# Patient Record
Sex: Female | Born: 1975 | Race: Black or African American | Hispanic: No | Marital: Single | Smoking: Current some day smoker
Health system: Southern US, Community
[De-identification: ages and names within clinical notes are randomized; demographics above are authoritative.]

## PROBLEM LIST (undated history)

## (undated) DIAGNOSIS — F32A Depression, unspecified: Secondary | ICD-10-CM

## (undated) DIAGNOSIS — J45909 Unspecified asthma, uncomplicated: Secondary | ICD-10-CM

## (undated) DIAGNOSIS — F209 Schizophrenia, unspecified: Secondary | ICD-10-CM

## (undated) DIAGNOSIS — E669 Obesity, unspecified: Secondary | ICD-10-CM

## (undated) DIAGNOSIS — J40 Bronchitis, not specified as acute or chronic: Secondary | ICD-10-CM

## (undated) DIAGNOSIS — F329 Major depressive disorder, single episode, unspecified: Secondary | ICD-10-CM

---
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
## 2017-01-20 ENCOUNTER — Encounter (HOSPITAL_COMMUNITY): Payer: Self-pay | Admitting: *Deleted

## 2017-01-20 ENCOUNTER — Emergency Department (HOSPITAL_COMMUNITY)
Admission: EM | Admit: 2017-01-20 | Discharge: 2017-01-20 | Disposition: A | Discharge status: Home / Self Care | Payer: Medicaid - Out of State | Attending: Emergency Medicine | Admitting: Emergency Medicine

## 2017-01-20 ENCOUNTER — Emergency Department (HOSPITAL_COMMUNITY): Payer: Medicaid - Out of State

## 2017-01-20 DIAGNOSIS — F17200 Nicotine dependence, unspecified, uncomplicated: Secondary | ICD-10-CM | POA: Insufficient documentation

## 2017-01-20 DIAGNOSIS — M5441 Lumbago with sciatica, right side: Secondary | ICD-10-CM | POA: Insufficient documentation

## 2017-01-20 DIAGNOSIS — M545 Low back pain: Secondary | ICD-10-CM | POA: Diagnosis present

## 2017-01-20 DIAGNOSIS — M4854XA Collapsed vertebra, not elsewhere classified, thoracic region, initial encounter for fracture: Secondary | ICD-10-CM | POA: Diagnosis not present

## 2017-01-20 DIAGNOSIS — S22000A Wedge compression fracture of unspecified thoracic vertebra, initial encounter for closed fracture: Secondary | ICD-10-CM

## 2017-01-20 HISTORY — DX: Obesity, unspecified: E66.9

## 2017-01-20 LAB — COMPREHENSIVE METABOLIC PANEL
ALT: 13 U/L — AB (ref 14–54)
AST: 15 U/L (ref 15–41)
Albumin: 3.3 g/dL — ABNORMAL LOW (ref 3.5–5.0)
Alkaline Phosphatase: 44 U/L (ref 38–126)
Anion gap: 6 (ref 5–15)
BILIRUBIN TOTAL: 0.4 mg/dL (ref 0.3–1.2)
BUN: 13 mg/dL (ref 6–20)
CHLORIDE: 107 mmol/L (ref 101–111)
CO2: 24 mmol/L (ref 22–32)
CREATININE: 0.98 mg/dL (ref 0.44–1.00)
Calcium: 8.3 mg/dL — ABNORMAL LOW (ref 8.9–10.3)
GFR calc Af Amer: >60 mL/min (ref >60)
GLUCOSE: 87 mg/dL (ref 65–99)
Potassium: 4.2 mmol/L (ref 3.5–5.1)
Sodium: 137 mmol/L (ref 135–145)
TOTAL PROTEIN: 6.5 g/dL (ref 6.5–8.1)

## 2017-01-20 LAB — URINALYSIS, ROUTINE W REFLEX MICROSCOPIC
Bacteria, UA: NONE SEEN
Bilirubin Urine: NEGATIVE
Glucose, UA: NEGATIVE mg/dL
Hgb urine dipstick: NEGATIVE
Ketones, ur: NEGATIVE mg/dL
Nitrite: NEGATIVE
PROTEIN: NEGATIVE mg/dL
Specific Gravity, Urine: 1.015 (ref 1.005–1.030)
pH: 7 (ref 5.0–8.0)

## 2017-01-20 LAB — CBC
HCT: 36.8 % (ref 36.0–46.0)
Hemoglobin: 11.7 g/dL — ABNORMAL LOW (ref 12.0–15.0)
MCH: 27.2 pg (ref 26.0–34.0)
MCHC: 31.8 g/dL (ref 30.0–36.0)
MCV: 85.6 fL (ref 78.0–100.0)
PLATELETS: 442 10*3/uL — AB (ref 150–400)
RBC: 4.3 MIL/uL (ref 3.87–5.11)
RDW: 15.5 % (ref 11.5–15.5)
WBC: 13.9 10*3/uL — AB (ref 4.0–10.5)

## 2017-01-20 LAB — LIPASE, BLOOD: Lipase: 23 U/L (ref 11–51)

## 2017-01-20 LAB — I-STAT BETA HCG BLOOD, ED (MC, WL, AP ONLY): I-stat hCG, quantitative: <5 m[IU]/mL (ref <5)

## 2017-01-20 MED ORDER — OXYCODONE-ACETAMINOPHEN 5-325 MG PO TABS: 1.0000 | ORAL_TABLET | ORAL | 0 refills | Status: DC | PRN

## 2017-01-20 MED ORDER — PREDNISONE 10 MG PO TABS: 10.0000 mg | ORAL_TABLET | Freq: Every day | ORAL | 0 refills | Status: DC

## 2017-01-20 MED ORDER — NAPROXEN 375 MG PO TABS: 375.0000 mg | ORAL_TABLET | Freq: Two times a day (BID) | ORAL | 0 refills | Status: DC

## 2017-01-20 MED ORDER — HYDROMORPHONE HCL 1 MG/ML IJ SOLN
1.0000 mg | Freq: Once | INTRAMUSCULAR | Status: AC
  Administered 2017-01-20: 1 mg via INTRAMUSCULAR
  Filled 2017-01-20: qty 1

## 2017-01-20 NOTE — ED Triage Notes (Signed)
Pt reports having ongoing RLQ pain for over a week, its now radiating to right side lower back. Increases with pain and reports urinary frequency.

## 2017-01-20 NOTE — Discharge Instructions (Signed)
You have an old compression fracture of thoracic lumbar vertebrae #11.  Additionally, I think there is nerve pain that originates in your lower back and radiates to the right leg.  Prescription for pain medicine, anti-inflammatory medicine, prednisone. Ice pack. Follow-up with orthopedics. Phone number given.

## 2017-01-21 NOTE — ED Provider Notes (Signed)
[facility] DEPT Provider Note   CSN: [id number] Arrival date & time: 01/20/17  1037     History   Chief Complaint Chief Complaint  Patient presents with  . Abdominal Pain  . Back Pain    HPI Anna Moses is a 41 y.o. female.  Right lower back pain with radiation to the right leg for 1-2 weeks intermittently. No bowel or bladder incontinence. No dysuria, frequency, hematuria. She has had one emergency room visit to [facility] with minimal relief of pain. Severity is moderate. Palpation and positioning make pain worse      Past Medical History:  Diagnosis Date  . Obesity     There are no active problems to display for this patient.   History reviewed. No pertinent surgical history.  OB History    No data available       Home Medications    Prior to Admission medications   Medication Sig Start Date End Date Taking? Authorizing Provider  naproxen (NAPROSYN) 375 MG tablet Take 1 tablet (375 mg total) by mouth 2 (two) times daily. 01/20/17   Nat Christen, MD  oxyCODONE-acetaminophen (PERCOCET) 5-325 MG tablet Take 1-2 tablets by mouth every 4 (four) hours as needed. 01/20/17   Nat Christen, MD  predniSONE (DELTASONE) 10 MG tablet Take 1 tablet (10 mg total) by mouth daily with breakfast. 3 tablets for 3 days, 2 tablets for 3 days, one tablet for 3 days 01/20/17   Nat Christen, MD    Family History History reviewed. No pertinent family history.  Social History Social History  Substance Use Topics  . Smoking status: Current Every Day Smoker  . Smokeless tobacco: Not on file  . Alcohol use No     Allergies   Patient has no known allergies.   Review of Systems Review of Systems  All other systems reviewed and are negative.    Physical Exam Updated Vital Signs BP 130/78 (BP Location: Right Arm)   Pulse 76   Temp 98.2 F (36.8 C) (Oral)   Resp 18   LMP 01/09/2017   SpO2 99%   Physical Exam  Constitutional: She is oriented to person,  place, and time.  obese  HENT:  Head: Normocephalic and atraumatic.  Eyes: Conjunctivae are normal.  Neck: Neck supple.  Cardiovascular: Normal rate and regular rhythm.   Pulmonary/Chest: Effort normal and breath sounds normal.  Abdominal: Soft. Bowel sounds are normal.  Musculoskeletal:  Tender right lower back.  Pain with straight leg raise right greater than left  Neurological: She is alert and oriented to person, place, and time.  Skin: Skin is warm and dry.  Psychiatric: She has a normal mood and affect. Her behavior is normal.  Nursing note and vitals reviewed.    ED Treatments / Results  Labs (all labs ordered are listed, but only abnormal results are displayed) Labs Reviewed  COMPREHENSIVE METABOLIC PANEL - Abnormal; Notable for the following:       Result Value   Calcium 8.3 (*)    Albumin 3.3 (*)    ALT 13 (*)    All other components within normal limits  CBC - Abnormal; Notable for the following:    WBC 13.9 (*)    Hemoglobin 11.7 (*)    Platelets 442 (*)    All other components within normal limits  URINALYSIS, ROUTINE W REFLEX MICROSCOPIC - Abnormal; Notable for the following:    APPearance HAZY (*)    Leukocytes, UA LARGE (*)  Squamous Epithelial / LPF 0-5 (*)    All other components within normal limits  LIPASE, BLOOD  POC URINE PREG, ED    EKG  EKG Interpretation None       Radiology Dg Lumbar Spine Complete  Result Date: 01/20/2017 CLINICAL DATA:  Low back pain radiating down the right leg EXAM: LUMBAR SPINE - COMPLETE 4+ VIEW COMPARISON:  None. FINDINGS: T11 vertebral body compression fracture of indeterminate age. Remainder the vertebral body heights are maintained. No static listhesis. Mild levocurvature of the lumbar spine. Degenerative disc disease with disc height loss at T10-11 and T11-12. IMPRESSION: Age-indeterminate T11 vertebral body compression fracture. Electronically Signed   By: Kathreen Devoid   On: 01/20/2017 12:41     Procedures Procedures (including critical care time)  Medications Ordered in ED Medications  HYDROmorphone (DILAUDID) injection 1 mg (1 mg Intramuscular Given 01/20/17 1248)     Initial Impression / Assessment and Plan / ED Course  I have reviewed the triage vital signs and the nursing notes.  Pertinent labs & imaging results that were available during my care of the patient were reviewed by me and considered in my medical decision making (see chart for details).     Plain films of lumbar spine reveal a compression fracture of T11, age indeterminate. History and physical most consistent with sciatic-like pain.  Discharge medications Naprosyn 375 mg, Percocet, prednisone.  Final Clinical Impressions(s) / ED Diagnoses   Final diagnoses:  Acute right-sided low back pain with right-sided sciatica  Closed compression fracture of thoracic vertebra, initial encounter [facility])    New Prescriptions Discharge Medication List as of 01/20/2017  4:59 PM    START taking these medications   Details  naproxen (NAPROSYN) 375 MG tablet Take 1 tablet (375 mg total) by mouth 2 (two) times daily., Starting Fri 01/20/2017, Print    oxyCODONE-acetaminophen (PERCOCET) 5-325 MG tablet Take 1-2 tablets by mouth every 4 (four) hours as needed., Starting Fri 01/20/2017, Print    predniSONE (DELTASONE) 10 MG tablet Take 1 tablet (10 mg total) by mouth daily with breakfast. 3 tablets for 3 days, 2 tablets for 3 days, one tablet for 3 days, Starting Fri 01/20/2017, Print         Nat Christen, MD 01/21/17 1545

## 2017-10-21 ENCOUNTER — Encounter (HOSPITAL_COMMUNITY): Payer: Self-pay | Admitting: Emergency Medicine

## 2017-10-21 ENCOUNTER — Emergency Department (HOSPITAL_COMMUNITY)
Admission: EM | Admit: 2017-10-21 | Discharge: 2017-10-21 | Disposition: A | Discharge status: Home / Self Care | Payer: Medicaid Other | Attending: Emergency Medicine | Admitting: Emergency Medicine

## 2017-10-21 ENCOUNTER — Other Ambulatory Visit: Payer: Self-pay

## 2017-10-21 DIAGNOSIS — F17210 Nicotine dependence, cigarettes, uncomplicated: Secondary | ICD-10-CM | POA: Diagnosis not present

## 2017-10-21 DIAGNOSIS — G43909 Migraine, unspecified, not intractable, without status migrainosus: Secondary | ICD-10-CM | POA: Insufficient documentation

## 2017-10-21 DIAGNOSIS — Z79899 Other long term (current) drug therapy: Secondary | ICD-10-CM | POA: Insufficient documentation

## 2017-10-21 DIAGNOSIS — R51 Headache: Secondary | ICD-10-CM | POA: Diagnosis present

## 2017-10-21 MED ORDER — SODIUM CHLORIDE 0.9 % IV BOLUS (SEPSIS)
1000.0000 mL | Freq: Once | INTRAVENOUS | Status: AC
  Administered 2017-10-21: 1000 mL via INTRAVENOUS

## 2017-10-21 MED ORDER — IBUPROFEN 800 MG PO TABS: 800.0000 mg | ORAL_TABLET | Freq: Three times a day (TID) | ORAL | 0 refills | Status: DC

## 2017-10-21 MED ORDER — KETOROLAC TROMETHAMINE 30 MG/ML IJ SOLN
30.0000 mg | Freq: Once | INTRAMUSCULAR | Status: AC
  Administered 2017-10-21: 30 mg via INTRAVENOUS
  Filled 2017-10-21: qty 1

## 2017-10-21 MED ORDER — DIPHENHYDRAMINE HCL 25 MG PO TABS: 25.0000 mg | ORAL_TABLET | Freq: Four times a day (QID) | ORAL | 0 refills | Status: DC

## 2017-10-21 MED ORDER — SODIUM CHLORIDE 0.9 % IV SOLN
1000.0000 mL | INTRAVENOUS | Status: DC
  Administered 2017-10-21: 1000 mL via INTRAVENOUS

## 2017-10-21 MED ORDER — METOCLOPRAMIDE HCL 5 MG/ML IJ SOLN
10.0000 mg | Freq: Once | INTRAMUSCULAR | Status: AC
  Administered 2017-10-21: 10 mg via INTRAVENOUS
  Filled 2017-10-21: qty 2

## 2017-10-21 MED ORDER — METOCLOPRAMIDE HCL 10 MG PO TABS: 10.0000 mg | ORAL_TABLET | Freq: Four times a day (QID) | ORAL | 0 refills | Status: DC | PRN

## 2017-10-21 MED ORDER — DIPHENHYDRAMINE HCL 50 MG/ML IJ SOLN
25.0000 mg | Freq: Once | INTRAMUSCULAR | Status: AC
  Administered 2017-10-21: 25 mg via INTRAVENOUS
  Filled 2017-10-21: qty 1

## 2017-10-21 NOTE — ED Triage Notes (Signed)
Patient here from home with complaints of headache, nausea, vomiting. States "I have a headache everyday I wake up".

## 2017-10-21 NOTE — Discharge Instructions (Addendum)
1.  If you have a migraine headache, you may take an 800 mg tablet of ibuprofen, a Reglan tablet and a Benadryl tablet as prescribed.  Try to rest in a quiet dark room. 2.  You should have a recheck with a family doctor.  Use the referral number in your discharge instructions to help you find one.

## 2017-10-21 NOTE — ED Notes (Signed)
Pt family came out to nurse's desk stating that patient needs help. Went to room and patient c/o head is hurting really bad. Pt has cover over face. Pt friend states, "the back of her head is hurting so bad now, it's like Bell's Palsy". Asked pt if EDP has come to see her yet.  Informed pt that Dr Johnney Killian has signed up for her, so once she comes and assesses her and put orders in I would be glad to administer medications.  Went to inform DR Johnney Killian of pt in lots of pain.

## 2017-10-21 NOTE — ED Provider Notes (Signed)
[place] DEPT Provider Note   CSN: [id number] Arrival date & time: 10/21/17  1753     History   Chief Complaint Chief Complaint  Patient presents with  . Headache  . Emesis    HPI Anna Moses is a 42 y.o. female.  HPI She reports has had this headache for about 5 days.  She reports it was gradual in onset.  She was trying to treat it like a migraine.  Taking ibuprofen and Tylenol.  Headache however has not resolved and has gotten worse.  Headache is behind her forehead and eyes.  She reports facial pain on the left side.  She has had light sensitivity and sound sensitivity.  She reports intermittent nausea and vomiting.  No fevers no chills.  No sore throat.  No imbalance, falls, weakness numbness or tingling.  She reports she has been diagnosed with migraines for a number of years.  She reports she had old head injuries which seem to have triggered it.  She reports at one point time she is given migraine medicines but has not had them in a long time. Past Medical History:  Diagnosis Date  . Obesity     There are no active problems to display for this patient.   History reviewed. No pertinent surgical history.   OB History   None      Home Medications    Prior to Admission medications   Medication Sig Start Date End Date Taking? Authorizing Provider  amLODipine (NORVASC) 10 MG tablet Take 10 mg by mouth once.   Yes [provider]  Ibuprofen-diphenhydrAMINE Cit (ADVIL PM) 200-38 MG TABS Take 2 tablets by mouth 2 (two) times daily as needed (headaches).   Yes [provider]  diphenhydrAMINE (BENADRYL) 25 MG tablet Take 1 tablet (25 mg total) by mouth every 6 (six) hours. 10/21/17   Charlesetta Shanks, MD  ibuprofen (ADVIL,MOTRIN) 800 MG tablet Take 1 tablet (800 mg total) by mouth 3 (three) times daily. 10/21/17   Charlesetta Shanks, MD  metoCLOPramide (REGLAN) 10 MG tablet Take 1 tablet (10 mg total) by mouth every 6  (six) hours as needed for nausea (nausea/headache). 10/21/17   Charlesetta Shanks, MD  naproxen (NAPROSYN) 375 MG tablet Take 1 tablet (375 mg total) by mouth 2 (two) times daily. Patient not taking: Reported on 10/21/2017 01/20/17   Nat Christen, MD  oxyCODONE-acetaminophen (PERCOCET) 5-325 MG tablet Take 1-2 tablets by mouth every 4 (four) hours as needed. Patient not taking: Reported on 10/21/2017 01/20/17   Nat Christen, MD  predniSONE (DELTASONE) 10 MG tablet Take 1 tablet (10 mg total) by mouth daily with breakfast. 3 tablets for 3 days, 2 tablets for 3 days, one tablet for 3 days Patient not taking: Reported on 10/21/2017 01/20/17   Nat Christen, MD    Family History No family history on file.  Social History Social History   Tobacco Use  . Smoking status: Current Every Day Smoker  . Smokeless tobacco: Never Used  Substance Use Topics  . Alcohol use: No  . Drug use: Yes    Types: Marijuana     Allergies   Patient has no known allergies.   Review of Systems Review of Systems  10 Systems reviewed and are negative for acute change except as noted in the HPI.  Physical Exam Updated Vital Signs BP (!) 145/105 (BP Location: Right Arm)   Pulse 68   Temp 98 F (36.7 C) (Oral)   Resp 19  SpO2 100%   Physical Exam  Constitutional: She is oriented to person, place, and time.  Patient is alert and nontoxic.  She is covering her eyes and grimacing.  Mental status is clear.  HENT:  Head: Normocephalic and atraumatic.  Right Ear: External ear normal.  Left Ear: External ear normal.  Nose: Nose normal.  Mouth/Throat: Oropharynx is clear and moist.  Eyes: Pupils are equal, round, and reactive to light. EOM are normal.  Neck: Neck supple.  Cardiovascular: Normal rate, regular rhythm, normal heart sounds and intact distal pulses.  Pulmonary/Chest: Effort normal and breath sounds normal.  Abdominal: Soft. She exhibits no distension. There is no tenderness.  Musculoskeletal: Normal  range of motion. She exhibits no edema or tenderness.  Neurological: She is alert and oriented to person, place, and time. No cranial nerve deficit or sensory deficit. She exhibits normal muscle tone. Coordination normal.  Skin: Skin is warm and dry.  Psychiatric: She has a normal mood and affect.     ED Treatments / Results  Labs (all labs ordered are listed, but only abnormal results are displayed) Labs Reviewed - No data to display  EKG None  Radiology No results found.  Procedures Procedures (including critical care time)  Medications Ordered in ED Medications  sodium chloride 0.9 % bolus 1,000 mL (0 mLs Intravenous Stopped 10/21/17 2107)    Followed by  0.9 %  sodium chloride infusion (1,000 mLs Intravenous New Bag/Given 10/21/17 1905)  metoCLOPramide (REGLAN) injection 10 mg (10 mg Intravenous Given 10/21/17 1905)  ketorolac (TORADOL) 30 MG/ML injection 30 mg (30 mg Intravenous Given 10/21/17 1905)  diphenhydrAMINE (BENADRYL) injection 25 mg (25 mg Intravenous Given 10/21/17 1905)     Initial Impression / Assessment and Plan / ED Course  I have reviewed the triage vital signs and the nursing notes.  Pertinent labs & imaging results that were available during my care of the patient were reviewed by me and considered in my medical decision making (see chart for details).     Recheck 21: 35 lites are in the room, patient is talking on the phone, no distress.  She reports headache much better.  Final Clinical Impressions(s) / ED Diagnoses   Final diagnoses:  Migraine without status migrainosus, not intractable, unspecified migraine type   Presents aligned above.  She reports a long history of migraines.  She denies any symptoms suggestive of infectious etiology or vascular crisis.  Patient pain completely resolved by migraine cocktail.  Now well in appearance.  She is counseled on establishing follow-up and continued outpatient management. ED Discharge Orders         Ordered    ibuprofen (ADVIL,MOTRIN) 800 MG tablet  3 times daily     10/21/17 2136    metoCLOPramide (REGLAN) 10 MG tablet  Every 6 hours PRN     10/21/17 2136    diphenhydrAMINE (BENADRYL) 25 MG tablet  Every 6 hours     10/21/17 2136       Charlesetta Shanks, MD 10/21/17 2138

## 2017-10-21 NOTE — ED Notes (Addendum)
ED Provider at bedside. 

## 2018-02-28 ENCOUNTER — Ambulatory Visit (HOSPITAL_COMMUNITY): Payer: Self-pay | Admitting: Psychiatry

## 2018-12-11 ENCOUNTER — Other Ambulatory Visit: Payer: Self-pay

## 2018-12-11 ENCOUNTER — Encounter (HOSPITAL_COMMUNITY): Payer: Self-pay

## 2018-12-11 DIAGNOSIS — F17200 Nicotine dependence, unspecified, uncomplicated: Secondary | ICD-10-CM | POA: Diagnosis not present

## 2018-12-11 DIAGNOSIS — Z6825 Body mass index (BMI) 25.0-25.9, adult: Secondary | ICD-10-CM | POA: Diagnosis not present

## 2018-12-11 DIAGNOSIS — R102 Pelvic and perineal pain: Secondary | ICD-10-CM | POA: Insufficient documentation

## 2018-12-11 DIAGNOSIS — R1031 Right lower quadrant pain: Secondary | ICD-10-CM | POA: Diagnosis present

## 2018-12-11 DIAGNOSIS — E669 Obesity, unspecified: Secondary | ICD-10-CM | POA: Insufficient documentation

## 2018-12-11 DIAGNOSIS — J45909 Unspecified asthma, uncomplicated: Secondary | ICD-10-CM | POA: Insufficient documentation

## 2018-12-11 DIAGNOSIS — R112 Nausea with vomiting, unspecified: Secondary | ICD-10-CM | POA: Insufficient documentation

## 2018-12-11 MED ORDER — SODIUM CHLORIDE 0.9% FLUSH
3.0000 mL | Freq: Once | INTRAVENOUS | Status: AC
  Administered 2018-12-12: 3 mL via INTRAVENOUS

## 2018-12-11 NOTE — ED Triage Notes (Signed)
Abdominal pain for 2 hours with nausea and vomiting per pt.

## 2018-12-12 ENCOUNTER — Emergency Department (HOSPITAL_COMMUNITY)
Admission: EM | Admit: 2018-12-12 | Discharge: 2018-12-12 | Disposition: A | Discharge status: Home / Self Care | Payer: Medicaid Other | Attending: Emergency Medicine | Admitting: Emergency Medicine

## 2018-12-12 ENCOUNTER — Encounter (HOSPITAL_COMMUNITY): Payer: Self-pay

## 2018-12-12 ENCOUNTER — Emergency Department (HOSPITAL_COMMUNITY): Payer: Medicaid Other

## 2018-12-12 DIAGNOSIS — R1030 Lower abdominal pain, unspecified: Secondary | ICD-10-CM

## 2018-12-12 DIAGNOSIS — R112 Nausea with vomiting, unspecified: Secondary | ICD-10-CM

## 2018-12-12 DIAGNOSIS — R102 Pelvic and perineal pain: Secondary | ICD-10-CM

## 2018-12-12 HISTORY — DX: Schizophrenia, unspecified: F20.9

## 2018-12-12 HISTORY — DX: Bronchitis, not specified as acute or chronic: J40

## 2018-12-12 HISTORY — DX: Depression, unspecified: F32.A

## 2018-12-12 HISTORY — DX: Unspecified asthma, uncomplicated: J45.909

## 2018-12-12 LAB — CBC
HCT: 39.1 % (ref 36.0–46.0)
Hemoglobin: 12.6 g/dL (ref 12.0–15.0)
MCH: 28 pg (ref 26.0–34.0)
MCHC: 32.2 g/dL (ref 30.0–36.0)
MCV: 86.9 fL (ref 80.0–100.0)
Platelets: 410 10*3/uL — ABNORMAL HIGH (ref 150–400)
RBC: 4.5 MIL/uL (ref 3.87–5.11)
RDW: 14.8 % (ref 11.5–15.5)
WBC: 13.5 10*3/uL — ABNORMAL HIGH (ref 4.0–10.5)
nRBC: 0 % (ref 0.0–0.2)

## 2018-12-12 LAB — COMPREHENSIVE METABOLIC PANEL
ALT: 19 U/L (ref 0–44)
AST: 18 U/L (ref 15–41)
Albumin: 4.4 g/dL (ref 3.5–5.0)
Alkaline Phosphatase: 51 U/L (ref 38–126)
Anion gap: 9 (ref 5–15)
BUN: 15 mg/dL (ref 6–20)
CO2: 21 mmol/L — ABNORMAL LOW (ref 22–32)
Calcium: 9.2 mg/dL (ref 8.9–10.3)
Chloride: 108 mmol/L (ref 98–111)
Creatinine, Ser: 0.87 mg/dL (ref 0.44–1.00)
GFR calc Af Amer: >60 mL/min (ref >60)
GFR calc non Af Amer: >60 mL/min (ref >60)
Glucose, Bld: 108 mg/dL — ABNORMAL HIGH (ref 70–99)
Potassium: 3.5 mmol/L (ref 3.5–5.1)
Sodium: 138 mmol/L (ref 135–145)
Total Bilirubin: 0.7 mg/dL (ref 0.3–1.2)
Total Protein: 8.5 g/dL — ABNORMAL HIGH (ref 6.5–8.1)

## 2018-12-12 LAB — URINALYSIS, ROUTINE W REFLEX MICROSCOPIC
Bilirubin Urine: NEGATIVE
Glucose, UA: NEGATIVE mg/dL
Hgb urine dipstick: NEGATIVE
Ketones, ur: 20 mg/dL — AB
Leukocytes,Ua: NEGATIVE
Nitrite: NEGATIVE
Protein, ur: NEGATIVE mg/dL
Specific Gravity, Urine: 1.031 — ABNORMAL HIGH (ref 1.005–1.030)
pH: 5 (ref 5.0–8.0)

## 2018-12-12 LAB — LIPASE, BLOOD: Lipase: 23 U/L (ref 11–51)

## 2018-12-12 LAB — I-STAT BETA HCG BLOOD, ED (MC, WL, AP ONLY): I-stat hCG, quantitative: <5 m[IU]/mL (ref <5)

## 2018-12-12 MED ORDER — KETOROLAC TROMETHAMINE 30 MG/ML IJ SOLN
30.0000 mg | Freq: Once | INTRAMUSCULAR | Status: AC
  Administered 2018-12-12: 30 mg via INTRAVENOUS
  Filled 2018-12-12: qty 1

## 2018-12-12 MED ORDER — NAPROXEN 375 MG PO TABS: 375.0000 mg | ORAL_TABLET | Freq: Two times a day (BID) | ORAL | 0 refills | Status: DC

## 2018-12-12 MED ORDER — METOCLOPRAMIDE HCL 5 MG/ML IJ SOLN
10.0000 mg | Freq: Once | INTRAMUSCULAR | Status: AC
  Administered 2018-12-12: 01:00:00 10 mg via INTRAVENOUS
  Filled 2018-12-12: qty 2

## 2018-12-12 MED ORDER — PROMETHAZINE HCL 25 MG/ML IJ SOLN
12.5000 mg | Freq: Once | INTRAMUSCULAR | Status: AC
  Administered 2018-12-12: 03:00:00 12.5 mg via INTRAVENOUS
  Filled 2018-12-12: qty 1

## 2018-12-12 MED ORDER — SODIUM CHLORIDE 0.9 % IV BOLUS
1000.0000 mL | Freq: Once | INTRAVENOUS | Status: AC
  Administered 2018-12-12: 02:00:00 1000 mL via INTRAVENOUS

## 2018-12-12 MED ORDER — METOCLOPRAMIDE HCL 10 MG PO TABS: 10.0000 mg | ORAL_TABLET | Freq: Four times a day (QID) | ORAL | 0 refills | Status: DC | PRN

## 2018-12-12 MED ORDER — SODIUM CHLORIDE (PF) 0.9 % IJ SOLN
INTRAMUSCULAR | Status: AC
  Administered 2018-12-12: 03:00:00
  Filled 2018-12-12: qty 50

## 2018-12-12 MED ORDER — POLYETHYLENE GLYCOL 3350 17 GM/SCOOP PO POWD: 17.0000 g | Freq: Two times a day (BID) | ORAL | 0 refills | Status: DC

## 2018-12-12 MED ORDER — IOHEXOL 300 MG/ML  SOLN
100.0000 mL | Freq: Once | INTRAMUSCULAR | Status: AC | PRN
  Administered 2018-12-12: 100 mL via INTRAVENOUS

## 2018-12-12 MED ORDER — HYDROMORPHONE HCL 1 MG/ML IJ SOLN
1.0000 mg | Freq: Once | INTRAMUSCULAR | Status: AC
  Administered 2018-12-12: 02:00:00 1 mg via INTRAVENOUS
  Filled 2018-12-12: qty 1

## 2018-12-12 NOTE — Discharge Instructions (Signed)
Your work-up in the emergency department has been reassuring.  It is possible that your pain may be due to constipation.  Use MiraLAX as prescribed in addition to naproxen and Reglan.  You were also approximately 2 weeks away from your menstrual cycle.  If your pain is not secondary to constipation, we are unable to exclude the possibility of mittelschmerz.  This is a normal occurring pain during ovulation which will resolve on its own.  Your imaging did not show any concerning or infectious cause of your symptoms today.  Continue follow-up with your primary care doctor.  You may return for new or concerning symptoms.

## 2018-12-12 NOTE — ED Provider Notes (Signed)
[facility] DEPT Provider Note   CSN: [id number] Arrival date & time: 12/11/18  2122     History   Chief Complaint Chief Complaint  Patient presents with   Abdominal Pain    HPI Anna Moses is a 43 y.o. female.     43 year old female with a history of schizophrenia, asthma, bronchitis presents for right lower quadrant abdominal pain which began suddenly at 1800 tonight.  It radiates towards her right flank.  She has had sporadic nausea and vomiting.  Denies dysuria, but states that she feels as though she has voided less today than normal.  Abdominal SHx significant for C-section.  The history is provided by the patient. No language interpreter was used.  Abdominal Pain Pain location:  RLQ Pain radiates to:  R flank Pain severity:  Moderate Onset quality:  Sudden Duration:  8 hours Timing:  Constant Progression:  Unchanged Chronicity:  New Context: not sick contacts   Relieved by:  Nothing Associated symptoms: nausea and vomiting   Associated symptoms: no dysuria, no fever and no hematemesis   Risk factors: obesity     Past Medical History:  Diagnosis Date   Asthma    Bronchitis    Depression    Obesity    Schizophrenia ([place])     There are no active problems to display for this patient.   History reviewed. No pertinent surgical history.   OB History   No obstetric history on file.      Home Medications    Prior to Admission medications   Medication Sig Start Date End Date Taking? Authorizing Provider  Ibuprofen-diphenhydrAMINE Cit (ADVIL PM) 200-38 MG TABS Take 2 tablets by mouth 2 (two) times daily as needed (headaches).   Yes [provider]  diphenhydrAMINE (BENADRYL) 25 MG tablet Take 1 tablet (25 mg total) by mouth every 6 (six) hours. Patient not taking: Reported on 12/12/2018 10/21/17   Charlesetta Shanks, MD  ibuprofen (ADVIL,MOTRIN) 800 MG tablet Take 1 tablet (800 mg total) by mouth 3 (three)  times daily. Patient not taking: Reported on 12/12/2018 10/21/17   Charlesetta Shanks, MD  metoCLOPramide (REGLAN) 10 MG tablet Take 1 tablet (10 mg total) by mouth every 6 (six) hours as needed for nausea (nausea/headache). 12/12/18   Antonietta Breach, PA-C  naproxen (NAPROSYN) 375 MG tablet Take 1 tablet (375 mg total) by mouth 2 (two) times daily. 12/12/18   Antonietta Breach, PA-C  oxyCODONE-acetaminophen (PERCOCET) 5-325 MG tablet Take 1-2 tablets by mouth every 4 (four) hours as needed. Patient not taking: Reported on 10/21/2017 01/20/17   Nat Christen, MD  polyethylene glycol powder [facility]) 17 GM/SCOOP powder Take 17 g by mouth 2 (two) times daily. 12/12/18   Antonietta Breach, PA-C  predniSONE (DELTASONE) 10 MG tablet Take 1 tablet (10 mg total) by mouth daily with breakfast. 3 tablets for 3 days, 2 tablets for 3 days, one tablet for 3 days Patient not taking: Reported on 10/21/2017 01/20/17   Nat Christen, MD    Family History History reviewed. No pertinent family history.  Social History Social History   Tobacco Use   Smoking status: Current Every Day Smoker   Smokeless tobacco: Never Used  Substance Use Topics   Alcohol use: No   Drug use: Yes    Types: Marijuana     Allergies   Patient has no known allergies.   Review of Systems Review of Systems  Constitutional: Negative for fever.  Gastrointestinal: Positive for abdominal pain, nausea  and vomiting. Negative for hematemesis.  Genitourinary: Negative for dysuria.  Ten systems reviewed and are negative for acute change, except as noted in the HPI.    Physical Exam Updated Vital Signs BP 118/73    Pulse 71    Temp (!) 97.3 F (36.3 C) (Oral)    Resp 18    Ht 5\' 6"  (1.676 m)    Wt 72.6 kg    SpO2 99%    BMI 25.82 kg/m   Physical Exam Vitals signs and nursing note reviewed.  Constitutional:      General: She is not in acute distress.    Appearance: She is well-developed. She is not diaphoretic.     Comments: Appears  uncomfortable, nontoxic. Obese AA female.  HENT:     Head: Normocephalic and atraumatic.  Eyes:     General: No scleral icterus.    Conjunctiva/sclera: Conjunctivae normal.  Neck:     Musculoskeletal: Normal range of motion.  Cardiovascular:     Rate and Rhythm: Normal rate and regular rhythm.     Pulses: Normal pulses.     Comments: Not tachycardic as noted in triage Pulmonary:     Effort: Pulmonary effort is normal. No respiratory distress.     Comments: Respirations even and unlabored Abdominal:     Comments: There is tenderness to palpation to the right lower quadrant as well as mildly to the right lateral abdomen/flank.  Abdomen is obese, soft.  No peritoneal signs.  Exam limited secondary to habitus.  Musculoskeletal: Normal range of motion.  Skin:    General: Skin is warm and dry.     Coloration: Skin is not pale.     Findings: No erythema or rash.  Neurological:     Mental Status: She is alert and oriented to person, place, and time.     Coordination: Coordination normal.     Comments: GCS 15.  Moving all extremities spontaneously.  Psychiatric:        Behavior: Behavior normal.      ED Treatments / Results  Labs (all labs ordered are listed, but only abnormal results are displayed) Labs Reviewed  COMPREHENSIVE METABOLIC PANEL - Abnormal; Notable for the following components:      Result Value   CO2 21 (*)    Glucose, Bld 108 (*)    Total Protein 8.5 (*)    All other components within normal limits  CBC - Abnormal; Notable for the following components:   WBC 13.5 (*)    Platelets 410 (*)    All other components within normal limits  URINALYSIS, ROUTINE W REFLEX MICROSCOPIC - Abnormal; Notable for the following components:   APPearance HAZY (*)    Specific Gravity, Urine 1.031 (*)    Ketones, ur 20 (*)    All other components within normal limits  URINE CULTURE  LIPASE, BLOOD  I-STAT BETA HCG BLOOD, ED ([facility], [facility], [facility] ONLY)    EKG None  Radiology Ct  Abdomen Pelvis W Contrast  Result Date: 12/12/2018 CLINICAL DATA:  Abdominal pain, appendicitis suspected EXAM: CT ABDOMEN AND PELVIS WITH CONTRAST TECHNIQUE: Multidetector CT imaging of the abdomen and pelvis was performed using the standard protocol following bolus administration of intravenous contrast. CONTRAST:  131mL OMNIPAQUE IOHEXOL 300 MG/ML  SOLN COMPARISON:  CT abdomen pelvis 01/27/2017 FINDINGS: Lower chest: Lung bases are clear. Normal heart size. No pericardial effusion. Hepatobiliary: No focal liver abnormality is seen. No gallstones, gallbladder wall thickening, or biliary dilatation. Pancreas: Unremarkable. No pancreatic ductal  dilatation or surrounding inflammatory changes. Spleen: Normal in size without focal abnormality. Adrenals/Urinary Tract: Adrenal glands are unremarkable. Kidneys are normal, without renal calculi, focal lesion, or hydronephrosis. Question circumferential bladder thickening with perivesicular haze though urinary bladder is largely decompressed at the time of exam and therefore poorly evaluated by CT imaging. Stomach/Bowel: Distal esophagus, stomach and duodenal sweep are unremarkable. No bowel wall thickening or dilatation. No evidence of obstruction. Normal appendix in the right lower quadrant (2/44-50). Vascular/Lymphatic: Atherosclerotic plaque within the right common iliac. Normal caliber aorta. No aneurysm or ectasia. No suspicious or enlarged lymph nodes in the included lymphatic chains. Reproductive: Mildly retroflexed uterus. Small volume of fluid within the endocervical canal. Other: Small 1.2 cm cystic structure along the anterior peritoneal surface, stable since at least January 27, 2017. no free abdominopelvic fluid or gas. No bowel containing hernias. Mild posterior body wall edema. Postsurgical changes of the low anterior abdominal wall likely from prior Pfannenstiel incision. Musculoskeletal: Insert Degen spine No acute osseous abnormality or suspicious  osseous lesion. IMPRESSION: 1. No evidence of acute appendicitis. 2. Question circumferential bladder wall thickening with perivesicular haze though urinary bladder is largely decompressed at the time of exam and therefore poorly evaluated by CT imaging. Correlate with urinalysis to exclude cystitis. 3. Small 1.2 cm cystic structure along the anterior peritoneum is unchanged since 2018. Suspect probable seroma or unresolved hematoma related to prior surgery. 4. Small volume fluid within the cervical canal, correlate with patient's menstrual status. 5. Aortic Atherosclerosis (ICD10-I70.0). Electronically Signed   By: Lovena Le M.D.   On: 12/12/2018 02:42   US Pelvic Doppler (torsion R/o Or Mass Arterial Flow)  Result Date: 12/12/2018 CLINICAL DATA:  Pelvic pain for 1 day. EXAM: TRANSABDOMINAL AND TRANSVAGINAL ULTRASOUND OF PELVIS DOPPLER ULTRASOUND OF OVARIES TECHNIQUE: Both transabdominal and transvaginal ultrasound examinations of the pelvis were performed. Transabdominal technique was performed for global imaging of the pelvis including uterus, ovaries, adnexal regions, and pelvic cul-de-sac. It was necessary to proceed with endovaginal exam following the transabdominal exam to visualize the endometrium. Color and duplex Doppler ultrasound was utilized to evaluate blood flow to the ovaries. COMPARISON:  CT abdomen and pelvis 12/12/2018 FINDINGS: Uterus Measurements: 11.4 x 4.4 x 5.4 cm = volume: 139 mL. No fibroids or other mass visualized. Endometrium Thickness: 13.6.  No focal abnormality visualized. Right ovary Measurements: 3.5 x 2.5 x 2.6 cm = volume: 12 mL. Normal appearance/no adnexal mass. Left ovary Measurements: 2.4 x 1.5 x 1.7 cm = volume: 3.1 no mL. Normal appearance/no adnexal mass. Pulsed Doppler evaluation of both ovaries demonstrates normal low-resistance arterial and venous waveforms. Other findings No abnormal free fluid. IMPRESSION: Negative pelvic ultrasound. No acute or focal abnormality  to explain the patient's pelvic pain. Electronically Signed   By: San Morelle M.D.   On: 12/12/2018 05:37   US Pelvic Complete With Transvaginal  Result Date: 12/12/2018 CLINICAL DATA:  Pelvic pain for 1 day. EXAM: TRANSABDOMINAL AND TRANSVAGINAL ULTRASOUND OF PELVIS DOPPLER ULTRASOUND OF OVARIES TECHNIQUE: Both transabdominal and transvaginal ultrasound examinations of the pelvis were performed. Transabdominal technique was performed for global imaging of the pelvis including uterus, ovaries, adnexal regions, and pelvic cul-de-sac. It was necessary to proceed with endovaginal exam following the transabdominal exam to visualize the endometrium. Color and duplex Doppler ultrasound was utilized to evaluate blood flow to the ovaries. COMPARISON:  CT abdomen and pelvis 12/12/2018 FINDINGS: Uterus Measurements: 11.4 x 4.4 x 5.4 cm = volume: 139 mL. No fibroids or other mass visualized.  Endometrium Thickness: 13.6.  No focal abnormality visualized. Right ovary Measurements: 3.5 x 2.5 x 2.6 cm = volume: 12 mL. Normal appearance/no adnexal mass. Left ovary Measurements: 2.4 x 1.5 x 1.7 cm = volume: 3.1 no mL. Normal appearance/no adnexal mass. Pulsed Doppler evaluation of both ovaries demonstrates normal low-resistance arterial and venous waveforms. Other findings No abnormal free fluid. IMPRESSION: Negative pelvic ultrasound. No acute or focal abnormality to explain the patient's pelvic pain. Electronically Signed   By: San Morelle M.D.   On: 12/12/2018 05:37    Procedures Procedures (including critical care time)  Medications Ordered in ED Medications  sodium chloride flush (NS) 0.9 % injection 3 mL (3 mLs Intravenous Given 12/12/18 0107)  sodium chloride 0.9 % bolus 1,000 mL (0 mLs Intravenous Stopped 12/12/18 0651)  metoCLOPramide (REGLAN) injection 10 mg (10 mg Intravenous Given 12/12/18 0129)  HYDROmorphone (DILAUDID) injection 1 mg (1 mg Intravenous Given 12/12/18 0130)  iohexol (OMNIPAQUE)  300 MG/ML solution 100 mL (100 mLs Intravenous Contrast Given 12/12/18 0222)  sodium chloride (PF) 0.9 % injection (  Given by Other 12/12/18 0250)  promethazine (PHENERGAN) injection 12.5 mg (12.5 mg Intravenous Given 12/12/18 0247)  ketorolac (TORADOL) 30 MG/ML injection 30 mg (30 mg Intravenous Given 12/12/18 0428)     Initial Impression / Assessment and Plan / ED Course  I have reviewed the triage vital signs and the nursing notes.  Pertinent labs & imaging results that were available during my care of the patient were reviewed by me and considered in my medical decision making (see chart for details).        1:57 AM Patient presenting for right lower abdominal pain radiating to her right flank.  It began more suddenly at 1800 tonight.  Differential includes pyelonephritis versus kidney stone versus ruptured ovarian cyst.  Ovarian torsion and TOA felt less likely.  Sudden onset of pain is less characteristic of appendicitis.  She has been experiencing sporadic vomiting.  No bowel changes.  Patient noted to have a leukocytosis today, though this is mild and nonspecific.  Pending urinalysis, but will proceed with CT of the abdomen and pelvis.  Medications previously ordered for symptom control.  4:15 AM Patient CT scan with little evidence to explain her pain.  Urinalysis not suggestive of UTI; therefore doubt cystitis. On repeat exam, she has persistently tender in the right lower quadrant.  Unable to completely exclude pelvic pathology without ultrasound imaging.  Discussed this with the patient and she is agreeable to proceed.  Will give dose of Toradol for residual pain.  6:00 AM Ultrasound today is negative for torsion, TOA.  The patient is feeling much better on repeat assessment.  She denies any persistent nausea.  She has not had any recurrent vomiting.  Unclear cause of patient's abdominal pain.  She does allude to some more recent constipation.  Will start on MiraLAX.  Patient also given  Reglan and naproxen for symptom control.  She has been encouraged to follow-up with her primary care doctor.  Return precautions discussed and provided. Patient discharged in stable condition with no unaddressed concerns.  Vitals:   12/11/18 2130 12/12/18 0215 12/12/18 0400 12/12/18 0430  BP:  137/80 102/65 118/73  Pulse:  85 65 71  Resp:  18 17 18   Temp:      TempSrc:      SpO2:  100% 100% 99%  Weight: 72.6 kg     Height: 5\' 6"  (1.676 m)       Final  Clinical Impressions(s) / ED Diagnoses   Final diagnoses:  Pelvic pain in female  Lower abdominal pain  Non-intractable vomiting with nausea, unspecified vomiting type    ED Discharge Orders         Ordered    metoCLOPramide (REGLAN) 10 MG tablet  Every 6 hours PRN     12/12/18 0619    naproxen (NAPROSYN) 375 MG tablet  2 times daily     12/12/18 0619    polyethylene glycol powder (GLYCOLAX/MIRALAX) 17 GM/SCOOP powder  2 times daily     12/12/18 [id number]           Antonietta Breach, PA-C [id number]    Delora Fuel, MD [id number]

## 2018-12-13 LAB — URINE CULTURE: Culture: <10000 — AB

## 2019-04-03 ENCOUNTER — Other Ambulatory Visit: Payer: Self-pay

## 2019-04-03 ENCOUNTER — Emergency Department (HOSPITAL_COMMUNITY)
Admission: EM | Admit: 2019-04-03 | Discharge: 2019-04-04 | Discharge status: Against Medical Advice | Payer: Medicaid Other | Attending: Emergency Medicine | Admitting: Emergency Medicine

## 2019-04-03 DIAGNOSIS — R112 Nausea with vomiting, unspecified: Secondary | ICD-10-CM | POA: Insufficient documentation

## 2019-04-03 DIAGNOSIS — E860 Dehydration: Secondary | ICD-10-CM | POA: Diagnosis not present

## 2019-04-03 DIAGNOSIS — R1031 Right lower quadrant pain: Secondary | ICD-10-CM | POA: Diagnosis not present

## 2019-04-03 DIAGNOSIS — F17210 Nicotine dependence, cigarettes, uncomplicated: Secondary | ICD-10-CM | POA: Diagnosis not present

## 2019-04-03 DIAGNOSIS — Z5321 Procedure and treatment not carried out due to patient leaving prior to being seen by health care provider: Secondary | ICD-10-CM | POA: Insufficient documentation

## 2019-04-03 DIAGNOSIS — K2900 Acute gastritis without bleeding: Secondary | ICD-10-CM | POA: Diagnosis not present

## 2019-04-03 DIAGNOSIS — R109 Unspecified abdominal pain: Secondary | ICD-10-CM | POA: Diagnosis present

## 2019-04-03 DIAGNOSIS — Z8709 Personal history of other diseases of the respiratory system: Secondary | ICD-10-CM | POA: Insufficient documentation

## 2019-04-03 LAB — CBC
HCT: 38.5 % (ref 36.0–46.0)
Hemoglobin: 12.5 g/dL (ref 12.0–15.0)
MCH: 27.9 pg (ref 26.0–34.0)
MCHC: 32.5 g/dL (ref 30.0–36.0)
MCV: 85.9 fL (ref 80.0–100.0)
Platelets: 522 10*3/uL — ABNORMAL HIGH (ref 150–400)
RBC: 4.48 MIL/uL (ref 3.87–5.11)
RDW: 14.4 % (ref 11.5–15.5)
WBC: 10.1 10*3/uL (ref 4.0–10.5)
nRBC: 0 % (ref 0.0–0.2)

## 2019-04-03 LAB — COMPREHENSIVE METABOLIC PANEL
ALT: 22 U/L (ref 0–44)
AST: 20 U/L (ref 15–41)
Albumin: 3.9 g/dL (ref 3.5–5.0)
Alkaline Phosphatase: 48 U/L (ref 38–126)
Anion gap: 12 (ref 5–15)
BUN: 8 mg/dL (ref 6–20)
CO2: 20 mmol/L — ABNORMAL LOW (ref 22–32)
Calcium: 9.2 mg/dL (ref 8.9–10.3)
Chloride: 107 mmol/L (ref 98–111)
Creatinine, Ser: 0.89 mg/dL (ref 0.44–1.00)
GFR calc Af Amer: >60 mL/min (ref >60)
GFR calc non Af Amer: >60 mL/min (ref >60)
Glucose, Bld: 130 mg/dL — ABNORMAL HIGH (ref 70–99)
Potassium: 3.7 mmol/L (ref 3.5–5.1)
Sodium: 139 mmol/L (ref 135–145)
Total Bilirubin: 0.7 mg/dL (ref 0.3–1.2)
Total Protein: 7.7 g/dL (ref 6.5–8.1)

## 2019-04-03 LAB — LIPASE, BLOOD: Lipase: 29 U/L (ref 11–51)

## 2019-04-03 LAB — I-STAT BETA HCG BLOOD, ED (MC, WL, AP ONLY): I-stat hCG, quantitative: <5 m[IU]/mL (ref <5)

## 2019-04-03 NOTE — ED Triage Notes (Signed)
Pt c/o abd pain that began this a.m. Has vomited x1 - states can't hold anything down.

## 2019-04-03 NOTE — ED Notes (Signed)
Pt stated she could not stay after asking what the wait time was. Pt then walked out.

## 2019-04-04 ENCOUNTER — Emergency Department (HOSPITAL_COMMUNITY)
Admission: EM | Admit: 2019-04-04 | Discharge: 2019-04-04 | Disposition: A | Discharge status: Home / Self Care | Payer: Medicaid Other | Source: Home / Self Care | Attending: Emergency Medicine | Admitting: Emergency Medicine

## 2019-04-04 ENCOUNTER — Other Ambulatory Visit: Payer: Self-pay

## 2019-04-04 ENCOUNTER — Encounter (HOSPITAL_COMMUNITY): Payer: Self-pay

## 2019-04-04 ENCOUNTER — Emergency Department (HOSPITAL_COMMUNITY): Payer: Medicaid Other

## 2019-04-04 ENCOUNTER — Encounter (HOSPITAL_COMMUNITY): Payer: Self-pay | Admitting: Family Medicine

## 2019-04-04 ENCOUNTER — Ambulatory Visit (HOSPITAL_COMMUNITY)
Admission: EM | Admit: 2019-04-04 | Discharge: 2019-04-04 | Payer: Medicaid Other | Attending: Family Medicine | Admitting: Family Medicine

## 2019-04-04 DIAGNOSIS — R1013 Epigastric pain: Secondary | ICD-10-CM

## 2019-04-04 DIAGNOSIS — R112 Nausea with vomiting, unspecified: Secondary | ICD-10-CM

## 2019-04-04 DIAGNOSIS — F17210 Nicotine dependence, cigarettes, uncomplicated: Secondary | ICD-10-CM | POA: Insufficient documentation

## 2019-04-04 DIAGNOSIS — R1031 Right lower quadrant pain: Secondary | ICD-10-CM | POA: Insufficient documentation

## 2019-04-04 DIAGNOSIS — E860 Dehydration: Secondary | ICD-10-CM | POA: Insufficient documentation

## 2019-04-04 DIAGNOSIS — Z8709 Personal history of other diseases of the respiratory system: Secondary | ICD-10-CM | POA: Insufficient documentation

## 2019-04-04 DIAGNOSIS — K2900 Acute gastritis without bleeding: Secondary | ICD-10-CM

## 2019-04-04 LAB — URINALYSIS, ROUTINE W REFLEX MICROSCOPIC
Bilirubin Urine: NEGATIVE
Glucose, UA: NEGATIVE mg/dL
Hgb urine dipstick: NEGATIVE
Ketones, ur: 80 mg/dL — AB
Leukocytes,Ua: NEGATIVE
Nitrite: NEGATIVE
Protein, ur: 30 mg/dL — AB
Specific Gravity, Urine: 1.029 (ref 1.005–1.030)
pH: 5 (ref 5.0–8.0)

## 2019-04-04 LAB — CBC WITH DIFFERENTIAL/PLATELET
Abs Immature Granulocytes: 0.05 10*3/uL (ref 0.00–0.07)
Basophils Absolute: 0 10*3/uL (ref 0.0–0.1)
Basophils Relative: 0 %
Eosinophils Absolute: 0 10*3/uL (ref 0.0–0.5)
Eosinophils Relative: 0 %
HCT: 39.4 % (ref 36.0–46.0)
Hemoglobin: 12.5 g/dL (ref 12.0–15.0)
Immature Granulocytes: 0 %
Lymphocytes Relative: 14 %
Lymphs Abs: 1.9 10*3/uL (ref 0.7–4.0)
MCH: 28 pg (ref 26.0–34.0)
MCHC: 31.7 g/dL (ref 30.0–36.0)
MCV: 88.3 fL (ref 80.0–100.0)
Monocytes Absolute: 0.5 10*3/uL (ref 0.1–1.0)
Monocytes Relative: 4 %
Neutro Abs: 10.9 10*3/uL — ABNORMAL HIGH (ref 1.7–7.7)
Neutrophils Relative %: 82 %
Platelets: 522 10*3/uL — ABNORMAL HIGH (ref 150–400)
RBC: 4.46 MIL/uL (ref 3.87–5.11)
RDW: 14.6 % (ref 11.5–15.5)
WBC: 13.4 10*3/uL — ABNORMAL HIGH (ref 4.0–10.5)
nRBC: 0 % (ref 0.0–0.2)

## 2019-04-04 LAB — COMPREHENSIVE METABOLIC PANEL
ALT: 21 U/L (ref 0–44)
AST: 20 U/L (ref 15–41)
Albumin: 4.2 g/dL (ref 3.5–5.0)
Alkaline Phosphatase: 49 U/L (ref 38–126)
Anion gap: 10 (ref 5–15)
BUN: 10 mg/dL (ref 6–20)
CO2: 24 mmol/L (ref 22–32)
Calcium: 9.2 mg/dL (ref 8.9–10.3)
Chloride: 106 mmol/L (ref 98–111)
Creatinine, Ser: 1.01 mg/dL — ABNORMAL HIGH (ref 0.44–1.00)
GFR calc Af Amer: >60 mL/min (ref >60)
GFR calc non Af Amer: >60 mL/min (ref >60)
Glucose, Bld: 131 mg/dL — ABNORMAL HIGH (ref 70–99)
Potassium: 3.4 mmol/L — ABNORMAL LOW (ref 3.5–5.1)
Sodium: 140 mmol/L (ref 135–145)
Total Bilirubin: 0.3 mg/dL (ref 0.3–1.2)
Total Protein: 7.5 g/dL (ref 6.5–8.1)

## 2019-04-04 LAB — LIPASE, BLOOD: Lipase: 19 U/L (ref 11–51)

## 2019-04-04 MED ORDER — ONDANSETRON 4 MG PO TBDP: 4.0000 mg | ORAL_TABLET | Freq: Three times a day (TID) | ORAL | 0 refills | Status: DC | PRN

## 2019-04-04 MED ORDER — LIDOCAINE VISCOUS HCL 2 % MT SOLN
15.0000 mL | Freq: Once | OROMUCOSAL | Status: AC
  Administered 2019-04-04: 15 mL via ORAL
  Filled 2019-04-04: qty 15

## 2019-04-04 MED ORDER — FAMOTIDINE 20 MG PO TABS: 20.0000 mg | ORAL_TABLET | Freq: Two times a day (BID) | ORAL | 0 refills | Status: DC

## 2019-04-04 MED ORDER — ONDANSETRON HCL 4 MG/2ML IJ SOLN
4.0000 mg | Freq: Once | INTRAMUSCULAR | Status: AC
  Administered 2019-04-04: 4 mg via INTRAVENOUS
  Filled 2019-04-04: qty 2

## 2019-04-04 MED ORDER — SODIUM CHLORIDE 0.9 % IV BOLUS
1000.0000 mL | Freq: Once | INTRAVENOUS | Status: AC
  Administered 2019-04-04: 17:00:00 1000 mL via INTRAVENOUS

## 2019-04-04 MED ORDER — ALUM & MAG HYDROXIDE-SIMETH 200-200-20 MG/5ML PO SUSP
30.0000 mL | Freq: Once | ORAL | Status: AC
  Administered 2019-04-04: 30 mL via ORAL
  Filled 2019-04-04: qty 30

## 2019-04-04 MED ORDER — IOHEXOL 300 MG/ML  SOLN
100.0000 mL | Freq: Once | INTRAMUSCULAR | Status: AC | PRN
  Administered 2019-04-04: 20:00:00 100 mL via INTRAVENOUS

## 2019-04-04 MED ORDER — FAMOTIDINE 20 MG PO TABS
20.0000 mg | ORAL_TABLET | Freq: Once | ORAL | Status: AC
  Administered 2019-04-04: 20 mg via ORAL
  Filled 2019-04-04: qty 1

## 2019-04-04 MED ORDER — MORPHINE SULFATE (PF) 4 MG/ML IV SOLN
4.0000 mg | Freq: Once | INTRAVENOUS | Status: AC
  Administered 2019-04-04: 4 mg via INTRAVENOUS
  Filled 2019-04-04: qty 1

## 2019-04-04 NOTE — ED Notes (Signed)
Culture sent down with urine.  

## 2019-04-04 NOTE — ED Triage Notes (Signed)
Pt reports epigastric pain and RLQ pain that started 2 days ago, pt sent from [facility] for further evaluation. Pt was her last night but LWBS due to wait time. Pt seems uncomfortable in triage. Nausea/vomiting but no diarrhea

## 2019-04-04 NOTE — ED Provider Notes (Signed)
[place] EMERGENCY DEPARTMENT Provider Note   CSN: [id number] Arrival date & time: 04/04/19  1422     History Chief Complaint  Patient presents with  . Chest Pain  . Abdominal Pain    Anna Moses is a 43 y.o. female.  Pt presents to the ED today with abdominal pain and nausea and vomiting.  The pt denies any f/c.  She said sx have been going on for 2 days.  She came to the ED yesterday, but left due to the wait.  Labs done yesterday were unremarkable.  Neg preg.  No diarrhea.        Past Medical History:  Diagnosis Date  . Asthma   . Bronchitis   . Depression   . Obesity   . Schizophrenia ([place])     There are no problems to display for this patient.   History reviewed. No pertinent surgical history.   OB History   No obstetric history on file.     No family history on file.  Social History   Tobacco Use  . Smoking status: Current Every Day Smoker  . Smokeless tobacco: Never Used  Substance Use Topics  . Alcohol use: No  . Drug use: Yes    Types: Marijuana    Home Medications Prior to Admission medications   Medication Sig Start Date End Date Taking? Authorizing Provider  diphenhydrAMINE (BENADRYL) 25 MG tablet Take 1 tablet (25 mg total) by mouth every 6 (six) hours. Patient not taking: Reported on 12/12/2018 10/21/17   Charlesetta Shanks, MD  famotidine (PEPCID) 20 MG tablet Take 1 tablet (20 mg total) by mouth 2 (two) times daily. 04/04/19   Isla Pence, MD  ibuprofen (ADVIL,MOTRIN) 800 MG tablet Take 1 tablet (800 mg total) by mouth 3 (three) times daily. Patient not taking: Reported on 12/12/2018 10/21/17   Charlesetta Shanks, MD  Ibuprofen-diphenhydrAMINE Cit (ADVIL PM) 200-38 MG TABS Take 2 tablets by mouth 2 (two) times daily as needed (headaches).    [provider]  metoCLOPramide (REGLAN) 10 MG tablet Take 1 tablet (10 mg total) by mouth every 6 (six) hours as needed for nausea (nausea/headache). 12/12/18   Antonietta Breach, PA-C  naproxen (NAPROSYN) 375 MG tablet Take 1 tablet (375 mg total) by mouth 2 (two) times daily. 12/12/18   Antonietta Breach, PA-C  ondansetron (ZOFRAN ODT) 4 MG disintegrating tablet Take 1 tablet (4 mg total) by mouth every 8 (eight) hours as needed. 04/04/19   Isla Pence, MD  oxyCODONE-acetaminophen (PERCOCET) 5-325 MG tablet Take 1-2 tablets by mouth every 4 (four) hours as needed. Patient not taking: Reported on 10/21/2017 01/20/17   Nat Christen, MD  polyethylene glycol powder [facility]) 17 GM/SCOOP powder Take 17 g by mouth 2 (two) times daily. 12/12/18   Antonietta Breach, PA-C  predniSONE (DELTASONE) 10 MG tablet Take 1 tablet (10 mg total) by mouth daily with breakfast. 3 tablets for 3 days, 2 tablets for 3 days, one tablet for 3 days Patient not taking: Reported on 10/21/2017 01/20/17   Nat Christen, MD    Allergies    Patient has no known allergies.  Review of Systems   Review of Systems  Gastrointestinal: Positive for abdominal pain, nausea and vomiting.  All other systems reviewed and are negative.   Physical Exam Updated Vital Signs BP (!) 157/73   Pulse (!) 51   Temp 98.3 F (36.8 C) (Oral)   Resp 18   LMP 03/15/2019   SpO2 100%  Physical Exam Vitals and nursing note reviewed.  Constitutional:      Appearance: She is well-developed. She is obese.  HENT:     Head: Normocephalic and atraumatic.  Eyes:     Extraocular Movements: Extraocular movements intact.     Pupils: Pupils are equal, round, and reactive to light.  Cardiovascular:     Rate and Rhythm: Normal rate and regular rhythm.     Heart sounds: Normal heart sounds.  Pulmonary:     Effort: Pulmonary effort is normal.     Breath sounds: Normal breath sounds.  Abdominal:     General: Bowel sounds are normal.     Palpations: Abdomen is soft.     Tenderness: There is abdominal tenderness in the right lower quadrant.  Musculoskeletal:        General: Normal range of motion.     Cervical back:  Normal range of motion and neck supple.  Skin:    General: Skin is warm.     Capillary Refill: Capillary refill takes less than 2 seconds.  Neurological:     General: No focal deficit present.     Mental Status: She is alert and oriented to person, place, and time.  Psychiatric:        Mood and Affect: Mood normal.        Behavior: Behavior normal.     ED Results / Procedures / Treatments   Labs (all labs ordered are listed, but only abnormal results are displayed) Labs Reviewed  CBC WITH DIFFERENTIAL/PLATELET - Abnormal; Notable for the following components:      Result Value   WBC 13.4 (*)    Platelets 522 (*)    Neutro Abs 10.9 (*)    All other components within normal limits  COMPREHENSIVE METABOLIC PANEL - Abnormal; Notable for the following components:   Potassium 3.4 (*)    Glucose, Bld 131 (*)    Creatinine, Ser 1.01 (*)    All other components within normal limits  URINALYSIS, ROUTINE W REFLEX MICROSCOPIC - Abnormal; Notable for the following components:   APPearance CLOUDY (*)    Ketones, ur 80 (*)    Protein, ur 30 (*)    Bacteria, UA RARE (*)    All other components within normal limits  LIPASE, BLOOD    EKG None  Radiology DG Chest 2 View  Result Date: 04/04/2019 CLINICAL DATA:  Chest pain, mid lower chest pain and upper epigastric pain, unable to eat or drink for 2 days EXAM: CHEST - 2 VIEW COMPARISON:  He 01/2016 FINDINGS: Normal heart size, mediastinal contours, and pulmonary vascularity. Mild RIGHT basilar atelectasis and central peribronchial thickening. Remaining lungs clear. No pleural effusion or pneumothorax. Osseous structures unremarkable. IMPRESSION: Bronchitic changes with mild RIGHT basilar atelectasis. Electronically Signed   By: Lavonia Dana M.D.   On: 04/04/2019 15:45   CT Abdomen Pelvis W Contrast  Result Date: 04/04/2019 CLINICAL DATA:  Right lower quadrant abdominal pain. EXAM: CT ABDOMEN AND PELVIS WITH CONTRAST TECHNIQUE:  Multidetector CT imaging of the abdomen and pelvis was performed using the standard protocol following bolus administration of intravenous contrast. CONTRAST:  138mL OMNIPAQUE IOHEXOL 300 MG/ML  SOLN COMPARISON:  None. FINDINGS: Lower chest: Unremarkable. Hepatobiliary: No suspicious focal abnormality within the liver parenchyma. There is no evidence for gallstones, gallbladder wall thickening, or pericholecystic fluid. No intrahepatic or extrahepatic biliary dilation. Pancreas: No focal mass lesion. No dilatation of the main duct. No intraparenchymal cyst. No peripancreatic edema. Spleen: No splenomegaly. No  focal mass lesion. Adrenals/Urinary Tract: No adrenal nodule or mass. Kidneys unremarkable. No evidence for hydroureter. The urinary bladder appears normal for the degree of distention. Stomach/Bowel: Stomach is unremarkable. No gastric wall thickening. No evidence of outlet obstruction. Duodenum is normally positioned as is the ligament of Treitz. No small bowel wall thickening. No small bowel dilatation. The terminal ileum is normal. The appendix is normal. Vascular/Lymphatic: No abdominal aortic aneurysm. No abdominal aortic atherosclerotic calcification. There is no gastrohepatic or hepatoduodenal ligament lymphadenopathy. No retroperitoneal or mesenteric lymphadenopathy. No pelvic sidewall lymphadenopathy. Reproductive: The uterus is unremarkable.  There is no adnexal mass. Other: No intraperitoneal free fluid.12 mm low-density nodule anterior mesentery of the pelvis (64/3) is stable since prior and also comparing back to 01/10/2017, compatible with benign etiology. Musculoskeletal: No worrisome lytic or sclerotic osseous abnormality. IMPRESSION: 1. Stable exam. No acute findings in the abdomen/pelvis. No findings to explain the patient's history of right lower quadrant pain. Electronically Signed   By: Misty Stanley M.D.   On: 04/04/2019 20:08    Procedures Procedures (including critical care  time)  Medications Ordered in ED Medications  alum & mag hydroxide-simeth (MAALOX/MYLANTA) 200-200-20 MG/5ML suspension 30 mL (has no administration in time range)    And  lidocaine (XYLOCAINE) 2 % viscous mouth solution 15 mL (has no administration in time range)  famotidine (PEPCID) tablet 20 mg (has no administration in time range)  morphine 4 MG/ML injection 4 mg (4 mg Intravenous Given 04/04/19 1726)  ondansetron (ZOFRAN) injection 4 mg (4 mg Intravenous Given 04/04/19 1726)  sodium chloride 0.9 % bolus 1,000 mL (0 mLs Intravenous Stopped 04/04/19 2034)  morphine 4 MG/ML injection 4 mg (4 mg Intravenous Given 04/04/19 2009)  iohexol (OMNIPAQUE) 300 MG/ML solution 100 mL (100 mLs Intravenous Contrast Given 04/04/19 1948)    ED Course  I have reviewed the triage vital signs and the nursing notes.  Pertinent labs & imaging results that were available during my care of the patient were reviewed by me and considered in my medical decision making (see chart for details).    MDM Rules/Calculators/A&P                      Pt's work up is reassuring.  She is able to tolerate water, but it causes some epigastric pain when she drinks it.  I gave her some pepcid and a gi cocktail.    She is stable for d/c.  Return if worse.   Final Clinical Impression(s) / ED Diagnoses Final diagnoses:  Right lower quadrant abdominal pain  Dehydration  Non-intractable vomiting with nausea, unspecified vomiting type  Acute gastritis without hemorrhage, unspecified gastritis type    Rx / DC Orders ED Discharge Orders         Ordered    ondansetron (ZOFRAN ODT) 4 MG disintegrating tablet  Every 8 hours PRN     04/04/19 2100    famotidine (PEPCID) 20 MG tablet  2 times daily     04/04/19 2100           Isla Pence, MD 04/04/19 2101

## 2019-04-04 NOTE — ED Provider Notes (Signed)
[place]    CSN: [id number] Arrival date & time: 04/04/19  1305      History   Chief Complaint Chief Complaint  Patient presents with  . Abdominal Pain    HPI Anna Moses is a 43 y.o. female.   43 yo woman [facility] to [facility].  Pt here for abd pain onset 2 days... reports she has not been able to hold anything down  Denies fevers, diarrhea  Went to [facility] ED yest but left d/t wait time.   A&O x4... NAD.Marland Kitchen. ambulatory         Past Medical History:  Diagnosis Date  . Asthma   . Bronchitis   . Depression   . Obesity   . Schizophrenia ([place])     There are no problems to display for this patient.   History reviewed. No pertinent surgical history.  OB History   No obstetric history on file.      Home Medications    Prior to Admission medications   Medication Sig Start Date End Date Taking? Authorizing Provider  diphenhydrAMINE (BENADRYL) 25 MG tablet Take 1 tablet (25 mg total) by mouth every 6 (six) hours. Patient not taking: Reported on 12/12/2018 10/21/17   Charlesetta Shanks, MD  ibuprofen (ADVIL,MOTRIN) 800 MG tablet Take 1 tablet (800 mg total) by mouth 3 (three) times daily. Patient not taking: Reported on 12/12/2018 10/21/17   Charlesetta Shanks, MD  Ibuprofen-diphenhydrAMINE Cit (ADVIL PM) 200-38 MG TABS Take 2 tablets by mouth 2 (two) times daily as needed (headaches).    [provider]  metoCLOPramide (REGLAN) 10 MG tablet Take 1 tablet (10 mg total) by mouth every 6 (six) hours as needed for nausea (nausea/headache). 12/12/18   Antonietta Breach, PA-C  naproxen (NAPROSYN) 375 MG tablet Take 1 tablet (375 mg total) by mouth 2 (two) times daily. 12/12/18   Antonietta Breach, PA-C  oxyCODONE-acetaminophen (PERCOCET) 5-325 MG tablet Take 1-2 tablets by mouth every 4 (four) hours as needed. Patient not taking: Reported on 10/21/2017 01/20/17   Nat Christen, MD  polyethylene glycol powder [facility]) 17 GM/SCOOP powder Take 17 g  by mouth 2 (two) times daily. 12/12/18   Antonietta Breach, PA-C  predniSONE (DELTASONE) 10 MG tablet Take 1 tablet (10 mg total) by mouth daily with breakfast. 3 tablets for 3 days, 2 tablets for 3 days, one tablet for 3 days Patient not taking: Reported on 10/21/2017 01/20/17   Nat Christen, MD    Family History History reviewed. No pertinent family history.  Social History Social History   Tobacco Use  . Smoking status: Current Every Day Smoker  . Smokeless tobacco: Never Used  Substance Use Topics  . Alcohol use: No  . Drug use: Yes    Types: Marijuana     Allergies   Patient has no known allergies.   Review of Systems Review of Systems  Constitutional: Positive for fatigue.  HENT: Negative.   Respiratory: Negative.   Gastrointestinal: Positive for abdominal pain, nausea and vomiting. Negative for diarrhea.  Psychiatric/Behavioral: Positive for agitation.     Physical Exam Triage Vital Signs ED Triage Vitals [04/04/19 1323]  Enc Vitals Group     BP      Pulse      Resp      Temp      Temp src      SpO2      Weight      Height      Head Circumference  Peak Flow      Pain Score 8     Pain Loc      Pain Edu?      Excl. in [place]?    No data found.  Updated Vital Signs BP (!) 156/73 (BP Location: Right Arm)   Pulse 60   Temp 98.4 F (36.9 C) (Oral)   Resp 20   LMP 03/15/2019   SpO2 94%    Physical Exam Constitutional:      General: She is in acute distress.     Appearance: She is well-developed. She is obese.  HENT:     Head: Normocephalic.  Eyes:     Extraocular Movements: Extraocular movements intact.  Cardiovascular:     Heart sounds: Normal heart sounds.  Pulmonary:     Effort: Pulmonary effort is normal.     Breath sounds: Normal breath sounds.  Abdominal:     General: Abdomen is flat. Bowel sounds are absent. There is no distension.     Palpations: Abdomen is soft.     Tenderness: There is abdominal tenderness in the epigastric area.  There is no guarding or rebound.  Skin:    General: Skin is warm and dry.  Neurological:     General: No focal deficit present.     Mental Status: She is alert and oriented to person, place, and time.  Psychiatric:        Mood and Affect: Mood is anxious.      UC Treatments / Results  Labs (all labs ordered are listed, but only abnormal results are displayed) Labs Reviewed - No data to display  EKG   Radiology No results found.  Procedures Procedures (including critical care time)  Medications Ordered in UC Medications - No data to display  Initial Impression / Assessment and Plan / UC Course  I have reviewed the triage vital signs and the nursing notes.  Pertinent labs & imaging results that were available during my care of the patient were reviewed by me and considered in my medical decision making (see chart for details).    Final Clinical Impressions(s) / UC Diagnoses   Final diagnoses:  Epigastric pain     Discharge Instructions     You need to be evaluated and treated in the emergency room.  You are too sick for [place] to care for this problem    ED Prescriptions    None     I have reviewed the PDMP during this encounter.   Robyn Haber, MD 04/04/19 [phone]

## 2019-04-04 NOTE — Discharge Instructions (Addendum)
You need to be evaluated and treated in the emergency room.  You are too sick for [place] to care for this problem

## 2019-04-04 NOTE — ED Notes (Signed)
Patient is being discharged from the [facility] and sent to the Emergency Department via security by vehicle due to weather. Per Dr Joseph Art, patient is stable but in need of higher level of care due to severity of abdominal pain. Patient is aware and verbalizes understanding of plan of care.   Vitals:   04/04/19 1325  BP: (!) 156/73  Pulse: 60  Resp: 20  Temp: 98.4 F (36.9 C)  SpO2: 94%

## 2019-04-04 NOTE — ED Triage Notes (Signed)
Pt here for abd pain onset 2 days... reports she has not been able to hold anything down  Denies fevers, diarrhea  Went to [facility] ED yest but left d/t wait time.   A&O x4... NAD.Marland Kitchen. ambulatory

## 2019-04-05 ENCOUNTER — Encounter (HOSPITAL_COMMUNITY): Payer: Self-pay | Admitting: Emergency Medicine

## 2019-04-05 ENCOUNTER — Emergency Department (HOSPITAL_COMMUNITY)
Admission: EM | Admit: 2019-04-05 | Discharge: 2019-04-06 | Disposition: A | Discharge status: Home / Self Care | Payer: Medicaid Other | Attending: Emergency Medicine | Admitting: Emergency Medicine

## 2019-04-05 DIAGNOSIS — R1013 Epigastric pain: Secondary | ICD-10-CM | POA: Diagnosis present

## 2019-04-05 DIAGNOSIS — Z5321 Procedure and treatment not carried out due to patient leaving prior to being seen by health care provider: Secondary | ICD-10-CM | POA: Diagnosis not present

## 2019-04-05 DIAGNOSIS — R112 Nausea with vomiting, unspecified: Secondary | ICD-10-CM | POA: Diagnosis not present

## 2019-04-05 LAB — COMPREHENSIVE METABOLIC PANEL
ALT: 21 U/L (ref 0–44)
AST: 20 U/L (ref 15–41)
Albumin: 3.8 g/dL (ref 3.5–5.0)
Alkaline Phosphatase: 46 U/L (ref 38–126)
Anion gap: 11 (ref 5–15)
BUN: 9 mg/dL (ref 6–20)
CO2: 23 mmol/L (ref 22–32)
Calcium: 9.3 mg/dL (ref 8.9–10.3)
Chloride: 103 mmol/L (ref 98–111)
Creatinine, Ser: 0.94 mg/dL (ref 0.44–1.00)
GFR calc Af Amer: >60 mL/min (ref >60)
GFR calc non Af Amer: >60 mL/min (ref >60)
Glucose, Bld: 118 mg/dL — ABNORMAL HIGH (ref 70–99)
Potassium: 3.5 mmol/L (ref 3.5–5.1)
Sodium: 137 mmol/L (ref 135–145)
Total Bilirubin: 0.3 mg/dL (ref 0.3–1.2)
Total Protein: 6.9 g/dL (ref 6.5–8.1)

## 2019-04-05 LAB — CBC
HCT: 38 % (ref 36.0–46.0)
Hemoglobin: 12 g/dL (ref 12.0–15.0)
MCH: 27.6 pg (ref 26.0–34.0)
MCHC: 31.6 g/dL (ref 30.0–36.0)
MCV: 87.6 fL (ref 80.0–100.0)
Platelets: 501 10*3/uL — ABNORMAL HIGH (ref 150–400)
RBC: 4.34 MIL/uL (ref 3.87–5.11)
RDW: 14.2 % (ref 11.5–15.5)
WBC: 11 10*3/uL — ABNORMAL HIGH (ref 4.0–10.5)
nRBC: 0 % (ref 0.0–0.2)

## 2019-04-05 LAB — I-STAT BETA HCG BLOOD, ED (MC, WL, AP ONLY): I-stat hCG, quantitative: <5 m[IU]/mL (ref <5)

## 2019-04-05 LAB — LIPASE, BLOOD: Lipase: 23 U/L (ref 11–51)

## 2019-04-05 MED ORDER — ONDANSETRON 4 MG PO TBDP
4.0000 mg | ORAL_TABLET | Freq: Once | ORAL | Status: AC | PRN
  Administered 2019-04-05: 4 mg via ORAL
  Filled 2019-04-05: qty 1

## 2019-04-05 MED ORDER — SODIUM CHLORIDE 0.9% FLUSH: 3.0000 mL | Freq: Once | INTRAVENOUS | Status: DC

## 2019-04-05 NOTE — ED Triage Notes (Signed)
Reports epigastric pain with n/v that started 3 days ago.  Was seen yesterday for the same.

## 2019-04-06 NOTE — ED Notes (Signed)
Pt informed this tech she was leaving.

## 2019-05-30 ENCOUNTER — Other Ambulatory Visit: Payer: Self-pay

## 2019-05-30 ENCOUNTER — Encounter (HOSPITAL_COMMUNITY): Payer: Self-pay | Admitting: Emergency Medicine

## 2019-05-30 ENCOUNTER — Ambulatory Visit (HOSPITAL_COMMUNITY)
Admission: EM | Admit: 2019-05-30 | Discharge: 2019-05-30 | Disposition: A | Discharge status: Home / Self Care | Payer: Medicaid Other | Attending: Family Medicine | Admitting: Family Medicine

## 2019-05-30 DIAGNOSIS — R112 Nausea with vomiting, unspecified: Secondary | ICD-10-CM | POA: Diagnosis not present

## 2019-05-30 DIAGNOSIS — D473 Essential (hemorrhagic) thrombocythemia: Secondary | ICD-10-CM | POA: Diagnosis not present

## 2019-05-30 DIAGNOSIS — Z3202 Encounter for pregnancy test, result negative: Secondary | ICD-10-CM

## 2019-05-30 DIAGNOSIS — R1013 Epigastric pain: Secondary | ICD-10-CM | POA: Diagnosis not present

## 2019-05-30 DIAGNOSIS — D72829 Elevated white blood cell count, unspecified: Secondary | ICD-10-CM | POA: Diagnosis not present

## 2019-05-30 DIAGNOSIS — F17200 Nicotine dependence, unspecified, uncomplicated: Secondary | ICD-10-CM | POA: Insufficient documentation

## 2019-05-30 DIAGNOSIS — J45909 Unspecified asthma, uncomplicated: Secondary | ICD-10-CM | POA: Insufficient documentation

## 2019-05-30 DIAGNOSIS — E669 Obesity, unspecified: Secondary | ICD-10-CM | POA: Insufficient documentation

## 2019-05-30 DIAGNOSIS — Z20822 Contact with and (suspected) exposure to covid-19: Secondary | ICD-10-CM | POA: Diagnosis not present

## 2019-05-30 DIAGNOSIS — Z79899 Other long term (current) drug therapy: Secondary | ICD-10-CM | POA: Insufficient documentation

## 2019-05-30 DIAGNOSIS — D75839 Thrombocytosis, unspecified: Secondary | ICD-10-CM

## 2019-05-30 LAB — COMPREHENSIVE METABOLIC PANEL WITH GFR
ALT: 14 U/L (ref 0–44)
AST: 14 U/L — ABNORMAL LOW (ref 15–41)
Albumin: 3.8 g/dL (ref 3.5–5.0)
Alkaline Phosphatase: 47 U/L (ref 38–126)
Anion gap: 11 (ref 5–15)
BUN: 8 mg/dL (ref 6–20)
CO2: 24 mmol/L (ref 22–32)
Calcium: 9.6 mg/dL (ref 8.9–10.3)
Chloride: 105 mmol/L (ref 98–111)
Creatinine, Ser: 0.94 mg/dL (ref 0.44–1.00)
GFR calc Af Amer: >60 mL/min
GFR calc non Af Amer: >60 mL/min
Glucose, Bld: 128 mg/dL — ABNORMAL HIGH (ref 70–99)
Potassium: 3.4 mmol/L — ABNORMAL LOW (ref 3.5–5.1)
Sodium: 140 mmol/L (ref 135–145)
Total Bilirubin: 0.7 mg/dL (ref 0.3–1.2)
Total Protein: 7.7 g/dL (ref 6.5–8.1)

## 2019-05-30 LAB — CBC WITH DIFFERENTIAL/PLATELET
Abs Immature Granulocytes: 0.03 10*3/uL (ref 0.00–0.07)
Basophils Absolute: 0 10*3/uL (ref 0.0–0.1)
Basophils Relative: 0 %
Eosinophils Absolute: 0 10*3/uL (ref 0.0–0.5)
Eosinophils Relative: 0 %
HCT: 37.3 % (ref 36.0–46.0)
Hemoglobin: 12 g/dL (ref 12.0–15.0)
Immature Granulocytes: 0 %
Lymphocytes Relative: 21 %
Lymphs Abs: 2.4 10*3/uL (ref 0.7–4.0)
MCH: 27.3 pg (ref 26.0–34.0)
MCHC: 32.2 g/dL (ref 30.0–36.0)
MCV: 84.8 fL (ref 80.0–100.0)
Monocytes Absolute: 0.6 10*3/uL (ref 0.1–1.0)
Monocytes Relative: 6 %
Neutro Abs: 8.3 10*3/uL — ABNORMAL HIGH (ref 1.7–7.7)
Neutrophils Relative %: 73 %
Platelets: 547 10*3/uL — ABNORMAL HIGH (ref 150–400)
RBC: 4.4 MIL/uL (ref 3.87–5.11)
RDW: 14.5 % (ref 11.5–15.5)
WBC: 11.4 10*3/uL — ABNORMAL HIGH (ref 4.0–10.5)
nRBC: 0 % (ref 0.0–0.2)

## 2019-05-30 LAB — POCT URINALYSIS DIP (DEVICE)
Glucose, UA: NEGATIVE mg/dL
Ketones, ur: 40 mg/dL — AB
Nitrite: NEGATIVE
Protein, ur: 100 mg/dL — AB
Specific Gravity, Urine: >=1.03 (ref 1.005–1.030)
Urobilinogen, UA: 0.2 mg/dL (ref 0.0–1.0)
pH: 5.5 (ref 5.0–8.0)

## 2019-05-30 LAB — LIPASE, BLOOD: Lipase: 20 U/L (ref 11–51)

## 2019-05-30 LAB — POC URINE PREG, ED: Preg Test, Ur: NEGATIVE

## 2019-05-30 LAB — POCT PREGNANCY, URINE: Preg Test, Ur: NEGATIVE

## 2019-05-30 MED ORDER — ONDANSETRON 4 MG PO TBDP
ORAL_TABLET | ORAL | Status: AC
  Filled 2019-05-30: qty 1

## 2019-05-30 MED ORDER — ALUM & MAG HYDROXIDE-SIMETH 200-200-20 MG/5ML PO SUSP
ORAL | Status: AC
  Filled 2019-05-30: qty 30

## 2019-05-30 MED ORDER — SODIUM CHLORIDE 0.9 % IV BOLUS
1000.0000 mL | Freq: Once | INTRAVENOUS | Status: AC
  Administered 2019-05-30: 13:00:00 1000 mL via INTRAVENOUS

## 2019-05-30 MED ORDER — LIDOCAINE VISCOUS HCL 2 % MT SOLN
15.0000 mL | Freq: Once | OROMUCOSAL | Status: AC
  Administered 2019-05-30: 12:00:00 15 mL via ORAL

## 2019-05-30 MED ORDER — ONDANSETRON 4 MG PO TBDP
4.0000 mg | ORAL_TABLET | Freq: Once | ORAL | Status: AC
  Administered 2019-05-30: 12:00:00 4 mg via ORAL

## 2019-05-30 MED ORDER — ONDANSETRON 4 MG PO TBDP: 4.0000 mg | ORAL_TABLET | Freq: Three times a day (TID) | ORAL | 0 refills | Status: DC | PRN

## 2019-05-30 MED ORDER — ESOMEPRAZOLE MAGNESIUM 40 MG PO CPDR: 40.0000 mg | DELAYED_RELEASE_CAPSULE | Freq: Every day | ORAL | 0 refills | Status: DC

## 2019-05-30 MED ORDER — LIDOCAINE VISCOUS HCL 2 % MT SOLN
OROMUCOSAL | Status: AC
  Filled 2019-05-30: qty 15

## 2019-05-30 MED ORDER — ALUM & MAG HYDROXIDE-SIMETH 200-200-20 MG/5ML PO SUSP
30.0000 mL | Freq: Once | ORAL | Status: AC
  Administered 2019-05-30: 12:00:00 30 mL via ORAL

## 2019-05-30 MED ORDER — SUCRALFATE 1 G PO TABS: 1.0000 g | ORAL_TABLET | Freq: Three times a day (TID) | ORAL | 0 refills | Status: DC

## 2019-05-30 NOTE — ED Triage Notes (Signed)
Patient lying on left side, patient has repositioned self from lying on right side at previous check.  Respirations regular and unlabored.  20-regular

## 2019-05-30 NOTE — Discharge Instructions (Addendum)
You have been seen today for abdominal pain. Your evaluation was not suggestive of any emergent condition at this time. However, some abdominal problems make take more time to appear. Therefore, it is very important for you to pay attention to any new symptoms or worsening of your current condition.  Please proceed to the Emergency Department immediately should you begin to feel worse in any way or have any of the following symptoms: increasing or different abdominal pain, persistent/uncontrolled vomiting, inability to drink fluids, fevers, or shaking chills.   You have been tested for COVID-19 today. If your test returns positive, you will receive a phone call from [facility] regarding your results. Negative test results are not called. Both positive and negative results area always visible on MyChart. If you do not have a MyChart account, sign up instructions are provided in your discharge papers. Please do not hesitate to contact us should you have questions or concerns.

## 2019-05-30 NOTE — ED Triage Notes (Signed)
Vomiting for 3 days.  Patient reports she has "hunger pains"

## 2019-05-30 NOTE — ED Provider Notes (Signed)
[place]   [id number] 05/30/19 Arrival Time: [id number]  ASSESSMENT & PLAN:  1. Intractable vomiting with nausea, unspecified vomiting type   2. Abdominal pain, epigastric     Given here with mild improvement of symptoms: Meds ordered this encounter  Medications  . ondansetron (ZOFRAN-ODT) disintegrating tablet 4 mg  . sodium chloride 0.9 % bolus 1,000 mL  . AND Linked Order Group   . alum & mag hydroxide-simeth (MAALOX/MYLANTA) 200-200-20 MG/5ML suspension 30 mL   . lidocaine (XYLOCAINE) 2 % viscous mouth solution 15 mL    COVID PCR testing sent. No worrisome findings on abdominal exam but she continues to report pain. Possibility of gastric ulcer discussed. GI cocktail with some relief although temporary. No acute changes on labs. WBC and platelet count still slightly elevated compared to those obtained 03/2019. Unclear reason at this time. No signs of active infection. May benefit from endoscopy.   Clinical Course as of May 29 1353  Thu May 30, 2019  1206 Consistent with previous visits.  Pulse Rate(!): 54 [BH]  1207 Recheck 20. Reports breathing fast secondary to pain she is feeling.  Resp(!): 32 [BH]    Clinical Course User Index [BH] Vanessa Kick, MD    Prefers trial of: Meds ordered this encounter  Medications  . esomeprazole (NEXIUM) 40 MG capsule    Sig: Take 1 capsule (40 mg total) by mouth daily.    Dispense:  30 capsule    Refill:  0  . sucralfate (CARAFATE) 1 g tablet    Sig: Take 1 tablet (1 g total) by mouth 4 (four) times daily -  with meals and at bedtime.    Dispense:  28 tablet    Refill:  0  . ondansetron (ZOFRAN-ODT) 4 MG disintegrating tablet    Sig: Take 1 tablet (4 mg total) by mouth every 8 (eight) hours as needed for nausea or vomiting.    Dispense:  15 tablet    Refill:  0    Recommend: Follow-up Information    [facility].   Specialty: Emergency Medicine Why: If symptoms worsen in any  way. Contact information: [address] [id number] [place] [phone]       Schedule an appointment as soon as possible for a visit  with [facility].   Specialty: Gastroenterology Contact information: [address] [place] [phone]             Discharge Instructions     You have been seen today for abdominal pain. Your evaluation was not suggestive of any emergent condition at this time. However, some abdominal problems make take more time to appear. Therefore, it is very important for you to pay attention to any new symptoms or worsening of your current condition.  Please proceed to the Emergency Department immediately should you begin to feel worse in any way or have any of the following symptoms: increasing or different abdominal pain, persistent/uncontrolled vomiting, inability to drink fluids, fevers, or shaking chills.         Urine culture pending.  Reviewed expectations re: course of current medical issues. Questions answered. Outlined signs and symptoms indicating need for more acute intervention. Patient verbalized understanding. After Visit Summary given.   SUBJECTIVE: History from: patient. Anna Moses is a 44 y.o. female who presents with complaint of intermittent epigastric abdominal pain. Onset gradual, first noted approx 3 d ago. Discomfort described as "hunger pains"; without radiation; reports  current symptoms do not wake her at night. Reports normal flatus. Symptoms are unchanged since beginning. Fever: absent. Aggravating factors: have not been identified. Alleviating factors: have not been identified. Associated symptoms: none reported. She denies arthralgias, constipation, diarrhea, dysuria, fever, headache, myalgias and sweats. Appetite: decreased. PO intake: decreased. Ambulatory without assistance. Urinary symptoms: none. Bowel movements: have not  significantly changed. Has eaten some food this morning; small portions. Unsure if PO intake makes pain worse or better. Reviewed ED visit dated 04/04/2019; same symptoms. CT abd/pelvis with contrast was normal. OTC treatment: none. No frequent NSAID use. Denies illegal drug use. Denies frequent alcohol use.  Patient's last menstrual period was 05/16/2019.   Past Surgical History:  Procedure Laterality Date  . CESAREAN SECTION       OBJECTIVE:  Vitals:   05/30/19 1149  BP: (!) 150/81  Pulse: (!) 54  Resp: (!) 32  Temp: 99 F (37.2 C)  SpO2: 100%    General appearance: alert, oriented, no acute distress but does appear to be in discomfort; paces around room at times then prefers to lay on exam table HEENT: [place]; AT; oropharynx without lesions Lungs: unlabored respirations; CTAB CV: regular; bradycardia Abdomen: obese: soft; without distention; mild  and poorly localized tenderness to palpation over epigastric area; normal bowel sounds; without masses or organomegaly; without guarding or rebound tenderness Back: without reported CVA tenderness; FROM at waist Extremities: without LE edema; symmetrical; without gross deformities Skin: warm and dry Neurologic: normal gait Psychological: alert and cooperative; normal mood and affect  Labs reviewed by me: Results for orders placed or performed during the hospital encounter of 05/30/19  CBC with Differential  Result Value Ref Range   WBC 11.4 (H) 4.0 - 10.5 K/uL   RBC 4.40 3.87 - 5.11 MIL/uL   Hemoglobin 12.0 12.0 - 15.0 g/dL   HCT 37.3 36.0 - 46.0 %   MCV 84.8 80.0 - 100.0 fL   MCH 27.3 26.0 - 34.0 pg   MCHC 32.2 30.0 - 36.0 g/dL   RDW 14.5 11.5 - 15.5 %   Platelets 547 (H) 150 - 400 K/uL   nRBC 0.0 0.0 - 0.2 %   Neutrophils Relative % 73 %   Neutro Abs 8.3 (H) 1.7 - 7.7 K/uL   Lymphocytes Relative 21 %   Lymphs Abs 2.4 0.7 - 4.0 K/uL   Monocytes Relative 6 %   Monocytes Absolute 0.6 0.1 - 1.0 K/uL   Eosinophils Relative  0 %   Eosinophils Absolute 0.0 0.0 - 0.5 K/uL   Basophils Relative 0 %   Basophils Absolute 0.0 0.0 - 0.1 K/uL   Immature Granulocytes 0 %   Abs Immature Granulocytes 0.03 0.00 - 0.07 K/uL  Comprehensive metabolic panel  Result Value Ref Range   Sodium 140 135 - 145 mmol/L   Potassium 3.4 (L) 3.5 - 5.1 mmol/L   Chloride 105 98 - 111 mmol/L   CO2 24 22 - 32 mmol/L   Glucose, Bld 128 (H) 70 - 99 mg/dL   BUN 8 6 - 20 mg/dL   Creatinine, Ser 0.94 0.44 - 1.00 mg/dL   Calcium 9.6 8.9 - 10.3 mg/dL   Total Protein 7.7 6.5 - 8.1 g/dL   Albumin 3.8 3.5 - 5.0 g/dL   AST 14 (L) 15 - 41 U/L   ALT 14 0 - 44 U/L   Alkaline Phosphatase 47 38 - 126 U/L   Total Bilirubin 0.7 0.3 - 1.2 mg/dL   GFR calc non Af  Amer >60 >60 mL/min   GFR calc Af Amer >60 >60 mL/min   Anion gap 11 5 - 15  Lipase, blood  Result Value Ref Range   Lipase 20 11 - 51 U/L  POC urine pregnancy  Result Value Ref Range   Preg Test, Ur NEGATIVE NEGATIVE  POCT urinalysis dip (device)  Result Value Ref Range   Glucose, UA NEGATIVE NEGATIVE mg/dL   Bilirubin Urine SMALL (A) NEGATIVE   Ketones, ur 40 (A) NEGATIVE mg/dL   Specific Gravity, Urine >=1.030 1.005 - 1.030   Hgb urine dipstick SMALL (A) NEGATIVE   pH 5.5 5.0 - 8.0   Protein, ur 100 (A) NEGATIVE mg/dL   Urobilinogen, UA 0.2 0.0 - 1.0 mg/dL   Nitrite NEGATIVE NEGATIVE   Leukocytes,Ua TRACE (A) NEGATIVE  Pregnancy, urine POC  Result Value Ref Range   Preg Test, Ur NEGATIVE NEGATIVE   Labs Reviewed  POC URINE PREG, ED     No Known Allergies                                             Past Medical History:  Diagnosis Date  . Asthma   . Bronchitis   . Depression   . Obesity   . Schizophrenia [facility])     Social History   Socioeconomic History  . Marital status: Single    Spouse name: Not on file  . Number of children: Not on file  . Years of education: Not on file  . Highest education level: Not on file  Occupational History  . Not on file    Tobacco Use  . Smoking status: Current Every Day Smoker  . Smokeless tobacco: Never Used  Substance and Sexual Activity  . Alcohol use: No  . Drug use: Not Currently    Types: Marijuana  . Sexual activity: Not on file  Other Topics Concern  . Not on file  Social History Narrative  . Not on file   Social Determinants of Health   Financial Resource Strain:   . Difficulty of Paying Living Expenses: Not on file  Food Insecurity:   . Worried About Charity fundraiser in the Last Year: Not on file  . Ran Out of Food in the Last Year: Not on file  Transportation Needs:   . Lack of Transportation (Medical): Not on file  . Lack of Transportation (Non-Medical): Not on file  Physical Activity:   . Days of Exercise per Week: Not on file  . Minutes of Exercise per Session: Not on file  Stress:   . Feeling of Stress : Not on file  Social Connections:   . Frequency of Communication with Friends and Family: Not on file  . Frequency of Social Gatherings with Friends and Family: Not on file  . Attends Religious Services: Not on file  . Active Member of Clubs or Organizations: Not on file  . Attends Archivist Meetings: Not on file  . Marital Status: Not on file  Intimate Partner Violence:   . Fear of Current or Ex-Partner: Not on file  . Emotionally Abused: Not on file  . Physically Abused: Not on file  . Sexually Abused: Not on file   History reviewed. No pertinent family history.   Vanessa Kick, MD 05/30/19 1359

## 2019-05-31 ENCOUNTER — Emergency Department (HOSPITAL_COMMUNITY): Payer: Medicaid Other

## 2019-05-31 ENCOUNTER — Encounter (HOSPITAL_COMMUNITY): Payer: Self-pay

## 2019-05-31 ENCOUNTER — Emergency Department (HOSPITAL_COMMUNITY)
Admission: EM | Admit: 2019-05-31 | Discharge: 2019-05-31 | Disposition: A | Discharge status: Home / Self Care | Payer: Medicaid Other | Attending: Emergency Medicine | Admitting: Emergency Medicine

## 2019-05-31 ENCOUNTER — Other Ambulatory Visit: Payer: Self-pay

## 2019-05-31 DIAGNOSIS — E876 Hypokalemia: Secondary | ICD-10-CM

## 2019-05-31 DIAGNOSIS — F17200 Nicotine dependence, unspecified, uncomplicated: Secondary | ICD-10-CM | POA: Insufficient documentation

## 2019-05-31 DIAGNOSIS — J45909 Unspecified asthma, uncomplicated: Secondary | ICD-10-CM | POA: Insufficient documentation

## 2019-05-31 DIAGNOSIS — K769 Liver disease, unspecified: Secondary | ICD-10-CM | POA: Diagnosis not present

## 2019-05-31 DIAGNOSIS — R1032 Left lower quadrant pain: Secondary | ICD-10-CM | POA: Diagnosis present

## 2019-05-31 DIAGNOSIS — R109 Unspecified abdominal pain: Secondary | ICD-10-CM

## 2019-05-31 LAB — URINALYSIS, ROUTINE W REFLEX MICROSCOPIC
Bilirubin Urine: NEGATIVE
Glucose, UA: NEGATIVE mg/dL
Hgb urine dipstick: NEGATIVE
Ketones, ur: 20 mg/dL — AB
Leukocytes,Ua: NEGATIVE
Nitrite: NEGATIVE
Protein, ur: NEGATIVE mg/dL
Specific Gravity, Urine: >1.046 — ABNORMAL HIGH (ref 1.005–1.030)
pH: 6 (ref 5.0–8.0)

## 2019-05-31 LAB — CBC WITH DIFFERENTIAL/PLATELET
Abs Immature Granulocytes: 0.02 10*3/uL (ref 0.00–0.07)
Basophils Absolute: 0 10*3/uL (ref 0.0–0.1)
Basophils Relative: 0 %
Eosinophils Absolute: 0 10*3/uL (ref 0.0–0.5)
Eosinophils Relative: 0 %
HCT: 34.7 % — ABNORMAL LOW (ref 36.0–46.0)
Hemoglobin: 11.1 g/dL — ABNORMAL LOW (ref 12.0–15.0)
Immature Granulocytes: 0 %
Lymphocytes Relative: 30 %
Lymphs Abs: 3.1 10*3/uL (ref 0.7–4.0)
MCH: 27.4 pg (ref 26.0–34.0)
MCHC: 32 g/dL (ref 30.0–36.0)
MCV: 85.7 fL (ref 80.0–100.0)
Monocytes Absolute: 0.8 10*3/uL (ref 0.1–1.0)
Monocytes Relative: 8 %
Neutro Abs: 6.4 10*3/uL (ref 1.7–7.7)
Neutrophils Relative %: 62 %
Platelets: 472 10*3/uL — ABNORMAL HIGH (ref 150–400)
RBC: 4.05 MIL/uL (ref 3.87–5.11)
RDW: 14.6 % (ref 11.5–15.5)
WBC: 10.4 10*3/uL (ref 4.0–10.5)
nRBC: 0 % (ref 0.0–0.2)

## 2019-05-31 LAB — I-STAT BETA HCG BLOOD, ED (MC, WL, AP ONLY): I-stat hCG, quantitative: <5 m[IU]/mL (ref <5)

## 2019-05-31 LAB — COMPREHENSIVE METABOLIC PANEL
ALT: 13 U/L (ref 0–44)
AST: 14 U/L — ABNORMAL LOW (ref 15–41)
Albumin: 3.9 g/dL (ref 3.5–5.0)
Alkaline Phosphatase: 45 U/L (ref 38–126)
Anion gap: 12 (ref 5–15)
BUN: 13 mg/dL (ref 6–20)
CO2: 23 mmol/L (ref 22–32)
Calcium: 9.1 mg/dL (ref 8.9–10.3)
Chloride: 105 mmol/L (ref 98–111)
Creatinine, Ser: 0.94 mg/dL (ref 0.44–1.00)
GFR calc Af Amer: >60 mL/min (ref >60)
GFR calc non Af Amer: >60 mL/min (ref >60)
Glucose, Bld: 112 mg/dL — ABNORMAL HIGH (ref 70–99)
Potassium: 3.3 mmol/L — ABNORMAL LOW (ref 3.5–5.1)
Sodium: 140 mmol/L (ref 135–145)
Total Bilirubin: 0.5 mg/dL (ref 0.3–1.2)
Total Protein: 7.7 g/dL (ref 6.5–8.1)

## 2019-05-31 LAB — URINE CULTURE

## 2019-05-31 LAB — LIPASE, BLOOD: Lipase: 20 U/L (ref 11–51)

## 2019-05-31 MED ORDER — PROMETHAZINE HCL 25 MG PO TABS: 25.0000 mg | ORAL_TABLET | Freq: Four times a day (QID) | ORAL | 0 refills | Status: DC | PRN

## 2019-05-31 MED ORDER — HYDROMORPHONE HCL 1 MG/ML IJ SOLN
1.0000 mg | Freq: Once | INTRAMUSCULAR | Status: AC
  Administered 2019-05-31: 18:00:00 1 mg via INTRAVENOUS
  Filled 2019-05-31: qty 1

## 2019-05-31 MED ORDER — HYDROCODONE-ACETAMINOPHEN 5-325 MG PO TABS: 1.0000 | ORAL_TABLET | ORAL | 0 refills | Status: DC | PRN

## 2019-05-31 MED ORDER — POTASSIUM CHLORIDE CRYS ER 20 MEQ PO TBCR
40.0000 meq | EXTENDED_RELEASE_TABLET | Freq: Once | ORAL | Status: AC
  Administered 2019-05-31: 20:00:00 40 meq via ORAL
  Filled 2019-05-31: qty 2

## 2019-05-31 MED ORDER — IOHEXOL 300 MG/ML  SOLN
100.0000 mL | Freq: Once | INTRAMUSCULAR | Status: AC | PRN
  Administered 2019-05-31: 19:00:00 100 mL via INTRAVENOUS

## 2019-05-31 MED ORDER — PROMETHAZINE HCL 25 MG/ML IJ SOLN
25.0000 mg | Freq: Once | INTRAMUSCULAR | Status: AC
  Administered 2019-05-31: 22:00:00 25 mg via INTRAVENOUS
  Filled 2019-05-31: qty 1

## 2019-05-31 MED ORDER — ONDANSETRON HCL 4 MG/2ML IJ SOLN
4.0000 mg | Freq: Once | INTRAMUSCULAR | Status: AC
  Administered 2019-05-31: 18:00:00 4 mg via INTRAVENOUS
  Filled 2019-05-31: qty 2

## 2019-05-31 MED ORDER — IOHEXOL 300 MG/ML  SOLN
75.0000 mL | Freq: Once | INTRAMUSCULAR | Status: AC | PRN
  Administered 2019-05-31: 21:00:00 75 mL via INTRAVENOUS

## 2019-05-31 MED ORDER — SODIUM CHLORIDE 0.9 % IV BOLUS
1000.0000 mL | Freq: Once | INTRAVENOUS | Status: AC
  Administered 2019-05-31: 18:00:00 1000 mL via INTRAVENOUS

## 2019-05-31 NOTE — Discharge Instructions (Addendum)
If you develop worsening, continued, or recurrent abdominal pain, uncontrolled vomiting, fever, chest or back pain, or any other new/concerning symptoms then return to the ER for evaluation.   It is very important to get follow-up for the liver lesion seen on CT scan.  You will need a biopsy of these.  If you have a family physician they can schedule this, otherwise call the oncologist listed to help set this up as an outpatient.

## 2019-05-31 NOTE — ED Notes (Signed)
Pt ambulated to BR with steady gait.

## 2019-05-31 NOTE — ED Notes (Signed)
Pt said she felt nauseous when she drank the ginger ale and decided to keep to the ice chips.

## 2019-05-31 NOTE — ED Provider Notes (Signed)
[facility] DEPT Provider Note   CSN: [id number] Arrival date & time: 05/31/19  1703     History Chief Complaint  Patient presents with  . Abdominal Pain    Anna Moses is a 44 y.o. female.  HPI 44 year old female presents with abdominal pain and vomiting.  Started about 2 or 3 days ago.  Went to urgent care yesterday but still having vomiting.  She states at first there was a little bit of blood in her emesis and stool but no longer.  No further diarrhea but she is also not eating.  She is having severe pain in her left lower quadrant.  She is had abdominal pain and vomiting issues in the fall and winter of last year but those pains were on the right side.  She states she does use marijuana but has not in a month.  No alcohol use.  No fevers.  Sometimes when she tries to eat she will have pain refluxing from her epigastrium up to her chest.  No significant back pain or urinary symptoms.  No vaginal bleeding or discharge.   Past Medical History:  Diagnosis Date  . Asthma   . Bronchitis   . Depression   . Obesity   . Schizophrenia ([facility])     There are no problems to display for this patient.   Past Surgical History:  Procedure Laterality Date  . CESAREAN SECTION       OB History   No obstetric history on file.     No family history on file.  Social History   Tobacco Use  . Smoking status: Current Every Day Smoker  . Smokeless tobacco: Never Used  Substance Use Topics  . Alcohol use: No  . Drug use: Not Currently    Types: Marijuana    Home Medications Prior to Admission medications   Medication Sig Start Date End Date Taking? Authorizing Provider  bismuth subsalicylate (PEPTO BISMOL) 262 MG/15ML suspension Take 30 mLs by mouth every 6 (six) hours as needed for indigestion.    Yes [provider]  esomeprazole (NEXIUM) 40 MG capsule Take 1 capsule (40 mg total) by mouth daily. 05/30/19  Yes Hagler, Aaron Edelman, MD    ondansetron (ZOFRAN-ODT) 4 MG disintegrating tablet Take 1 tablet (4 mg total) by mouth every 8 (eight) hours as needed for nausea or vomiting. 05/30/19  Yes Hagler, Aaron Edelman, MD  sucralfate (CARAFATE) 1 g tablet Take 1 tablet (1 g total) by mouth 4 (four) times daily -  with meals and at bedtime. 05/30/19  Yes Hagler, Aaron Edelman, MD  HYDROcodone-acetaminophen (NORCO) 5-325 MG tablet Take 1 tablet by mouth every 4 (four) hours as needed for severe pain. 05/31/19   Sherwood Gambler, MD  promethazine (PHENERGAN) 25 MG tablet Take 1 tablet (25 mg total) by mouth every 6 (six) hours as needed for nausea or vomiting. 05/31/19   Sherwood Gambler, MD  diphenhydrAMINE (BENADRYL) 25 MG tablet Take 1 tablet (25 mg total) by mouth every 6 (six) hours. Patient not taking: Reported on 12/12/2018 10/21/17 05/30/19  Charlesetta Shanks, MD  famotidine (PEPCID) 20 MG tablet Take 1 tablet (20 mg total) by mouth 2 (two) times daily. 04/04/19 05/30/19  Isla Pence, MD  Ibuprofen-diphenhydrAMINE Cit (ADVIL PM) 200-38 MG TABS Take 2 tablets by mouth 2 (two) times daily as needed (headaches).  05/30/19  [provider]  metoCLOPramide (REGLAN) 10 MG tablet Take 1 tablet (10 mg total) by mouth every 6 (six) hours as needed for  nausea (nausea/headache). 12/12/18 05/30/19  Antonietta Breach, PA-C    Allergies    Patient has no known allergies.  Review of Systems   Review of Systems  Constitutional: Negative for fever.  Cardiovascular: Negative for chest pain.  Gastrointestinal: Positive for abdominal pain, diarrhea, nausea and vomiting.  Genitourinary: Positive for decreased urine volume. Negative for dysuria, vaginal bleeding and vaginal discharge.  Musculoskeletal: Negative for back pain.  All other systems reviewed and are negative.   Physical Exam Updated Vital Signs BP 117/63   Pulse 63   Temp 98.1 F (36.7 C) (Oral)   Resp 18   LMP 05/16/2019   SpO2 100%   Physical Exam Vitals and nursing note reviewed.   Constitutional:      Appearance: She is well-developed.  HENT:     Head: Normocephalic and atraumatic.     Right Ear: External ear normal.     Left Ear: External ear normal.     Nose: Nose normal.  Eyes:     General:        Right eye: No discharge.        Left eye: No discharge.  Cardiovascular:     Rate and Rhythm: Regular rhythm. Bradycardia present.     Heart sounds: Normal heart sounds.  Pulmonary:     Effort: Pulmonary effort is normal.     Breath sounds: Normal breath sounds.  Abdominal:     Palpations: Abdomen is soft.     Tenderness: There is abdominal tenderness in the left lower quadrant. There is no right CVA tenderness or left CVA tenderness.  Skin:    General: Skin is warm and dry.  Neurological:     Mental Status: She is alert.  Psychiatric:        Mood and Affect: Mood is not anxious.     ED Results / Procedures / Treatments   Labs (all labs ordered are listed, but only abnormal results are displayed) Labs Reviewed  URINALYSIS, ROUTINE W REFLEX MICROSCOPIC - Abnormal; Notable for the following components:      Result Value   Specific Gravity, Urine >1.046 (*)    Ketones, ur 20 (*)    All other components within normal limits  COMPREHENSIVE METABOLIC PANEL - Abnormal; Notable for the following components:   Potassium 3.3 (*)    Glucose, Bld 112 (*)    AST 14 (*)    All other components within normal limits  CBC WITH DIFFERENTIAL/PLATELET - Abnormal; Notable for the following components:   Hemoglobin 11.1 (*)    HCT 34.7 (*)    Platelets 472 (*)    All other components within normal limits  LIPASE, BLOOD  I-STAT BETA HCG BLOOD, ED ([facility], [facility], [facility] ONLY)    EKG EKG Interpretation  Date/Time:  Friday May 31 2019 17:35:47 EST Ventricular Rate:  62 PR Interval:    QRS Duration: 90 QT Interval:  444 QTC Calculation: 451 R Axis:   68 Text Interpretation: Sinus rhythm no acute ST/T changes similar to Dec 2020 Confirmed by Sherwood Gambler [id number])  on 05/31/2019 5:38:43 PM   Radiology CT Chest W Contrast  Result Date: 05/31/2019 CLINICAL DATA:  44 year old female with history of cancer of unknown primary. Staging exam. EXAM: CT CHEST WITH CONTRAST TECHNIQUE: Multidetector CT imaging of the chest was performed during intravenous contrast administration. CONTRAST:  22mL OMNIPAQUE IOHEXOL 300 MG/ML  SOLN COMPARISON:  No priors. FINDINGS: Cardiovascular: Heart size is normal. There is no significant pericardial fluid, thickening or pericardial  calcification. No significant atherosclerotic disease in the thoracic aorta. No calcified atherosclerotic plaque in the coronary arteries. Mediastinum/Nodes: No pathologically enlarged mediastinal or hilar lymph nodes. Please note that accurate exclusion of hilar adenopathy is limited on noncontrast CT scans. Esophagus is unremarkable in appearance. No axillary lymphadenopathy. Lungs/Pleura: No acute consolidative airspace disease. No pleural effusions. No suspicious appearing pulmonary nodules or masses are noted. Upper Abdomen: Please see separate dictation for contemporaneously obtained CT the abdomen and pelvis obtained 05/31/2019 for full description of findings beneath the diaphragm. Musculoskeletal: There are no aggressive appearing lytic or blastic lesions noted in the visualized portions of the skeleton. IMPRESSION: 1. No evidence of primary malignancy and no findings to suggest metastatic disease in the thorax. 2. No acute findings in the thorax. Electronically Signed   By: Vinnie Langton M.D.   On: 05/31/2019 20:57   CT ABDOMEN PELVIS W CONTRAST  Result Date: 05/31/2019 CLINICAL DATA:  Onset nausea, vomiting and diarrhea 05/28/2019. Left side abdominal and epigastric pain. EXAM: CT ABDOMEN AND PELVIS WITH CONTRAST TECHNIQUE: Multidetector CT imaging of the abdomen and pelvis was performed using the standard protocol following bolus administration of intravenous contrast. CONTRAST:  100 mL OMNIPAQUE  IOHEXOL 300 MG/ML  SOLN COMPARISON:  CT abdomen and pelvis 02/02/2019, 12/12/2018 and 01/27/2017. FINDINGS: Lower chest: Lung bases clear.  No pleural or pericardial effusion. Hepatobiliary: 2 new hypoattenuating lesions are seen in the right hepatic lobe. On image 16 of series 2, a 1.2 cm lesion is identified on image 24 of series 2, a 1.3 cm lesion is seen. Additional subtle lesions are seen in the right hepatic lobe on image 24 and the left hepatic lobe on image 15. The liver otherwise appears normal. Gallbladder and biliary tree are unremarkable. Pancreas: Unremarkable. No pancreatic ductal dilatation or surrounding inflammatory changes. Spleen: Normal in size without focal abnormality. Adrenals/Urinary Tract: Adrenal glands are unremarkable. Kidneys are normal, without renal calculi, focal lesion, or hydronephrosis. Bladder is unremarkable. Stomach/Bowel: Stomach is within normal limits. Appendix appears normal. No evidence of bowel wall thickening, distention, or inflammatory changes. Vascular/Lymphatic: No significant vascular findings are present. No enlarged abdominal or pelvic lymph nodes. Reproductive: Uterus and bilateral adnexa are unremarkable. Other: None. Musculoskeletal: No acute or focal abnormality. IMPRESSION: Multiple small liver lesions which are new since the most recent CT scan and most suspicious for metastatic disease. Primary lesion is not visualized. Recommend chest CT for further evaluation. These results were called by telephone at the time of interpretation on 05/31/2019 at 7:30 pm to provider Sherwood Gambler , who verbally acknowledged these results. Electronically Signed   By: Inge Rise M.D.   On: 05/31/2019 19:32    Procedures Procedures (including critical care time)  Medications Ordered in ED Medications  sodium chloride 0.9 % bolus 1,000 mL (0 mLs Intravenous Stopped 05/31/19 1957)  HYDROmorphone (DILAUDID) injection 1 mg (1 mg Intravenous Given 05/31/19 1737)   ondansetron (ZOFRAN) injection 4 mg (4 mg Intravenous Given 05/31/19 1737)  iohexol (OMNIPAQUE) 300 MG/ML solution 100 mL (100 mLs Intravenous Contrast Given 05/31/19 1847)  potassium chloride SA (KLOR-CON) CR tablet 40 mEq (40 mEq Oral Given 05/31/19 1958)  iohexol (OMNIPAQUE) 300 MG/ML solution 75 mL (75 mLs Intravenous Contrast Given 05/31/19 2040)  promethazine (PHENERGAN) injection 25 mg (25 mg Intravenous Given 05/31/19 2201)    ED Course  I have reviewed the triage vital signs and the nursing notes.  Pertinent labs & imaging results that were available during my care of the patient were  reviewed by me and considered in my medical decision making (see chart for details).    MDM Rules/Calculators/A&P                      Patient is feeling better with anti-emetics and pain medicine. Given location of her pain, as well as this being different, CT was obtained. No acute pathology to explain pain, but does have new liver lesions. CT chest without obvious malignancy. Discussed case with oncology, Dr. Lorenso Courier. Patient will need outpatient liver biopsy. However, given she's tolerating PO with pain control, does not need admission. Dr. Lorenso Courier will help arrange outpatient management. Otherwise, d/c home with pain/nausea control with return precautions.  Final Clinical Impression(s) / ED Diagnoses Final diagnoses:  Liver lesion  Left sided abdominal pain  Hypokalemia    Rx / DC Orders ED Discharge Orders         Ordered    HYDROcodone-acetaminophen (NORCO) 5-325 MG tablet  Every 4 hours PRN     05/31/19 2334    promethazine (PHENERGAN) 25 MG tablet  Every 6 hours PRN     05/31/19 2334           Sherwood Gambler, MD 06/01/19 0020

## 2019-05-31 NOTE — ED Triage Notes (Addendum)
44 yo female brought in by [facility] from home c/o n/v/d since Tuesday. S/B urgent care yesterday and given Zofran and Omeprazole. Sts unable to hold meds down with decreased PO intake. C/O left sided and epigastric abd pain. Per chart notes, pt was also seen here yesterday.

## 2019-06-01 ENCOUNTER — Other Ambulatory Visit: Payer: Self-pay | Admitting: Hematology and Oncology

## 2019-06-01 DIAGNOSIS — R160 Hepatomegaly, not elsewhere classified: Secondary | ICD-10-CM

## 2019-06-01 LAB — NOVEL CORONAVIRUS, NAA (HOSP ORDER, SEND-OUT TO REF LAB; TAT 18-24 HRS): SARS-CoV-2, NAA: NOT DETECTED

## 2019-06-03 ENCOUNTER — Emergency Department (HOSPITAL_COMMUNITY)
Admission: EM | Admit: 2019-06-03 | Discharge: 2019-06-04 | Disposition: A | Discharge status: Home / Self Care | Payer: Medicaid Other | Attending: Emergency Medicine | Admitting: Emergency Medicine

## 2019-06-03 ENCOUNTER — Ambulatory Visit (INDEPENDENT_AMBULATORY_CARE_PROVIDER_SITE_OTHER): Payer: Medicaid Other | Admitting: Gastroenterology

## 2019-06-03 ENCOUNTER — Encounter (HOSPITAL_COMMUNITY): Payer: Self-pay | Admitting: Emergency Medicine

## 2019-06-03 ENCOUNTER — Encounter (HOSPITAL_COMMUNITY): Payer: Self-pay | Admitting: Radiology

## 2019-06-03 ENCOUNTER — Telehealth: Payer: Self-pay | Admitting: Hematology and Oncology

## 2019-06-03 ENCOUNTER — Encounter: Payer: Self-pay | Admitting: Gastroenterology

## 2019-06-03 ENCOUNTER — Emergency Department (HOSPITAL_COMMUNITY): Payer: Medicaid Other

## 2019-06-03 ENCOUNTER — Other Ambulatory Visit: Payer: Self-pay

## 2019-06-03 VITALS — BP 128/70 | HR 78 | Ht 61.0 in | Wt 245.0 lb

## 2019-06-03 DIAGNOSIS — R112 Nausea with vomiting, unspecified: Secondary | ICD-10-CM | POA: Diagnosis not present

## 2019-06-03 DIAGNOSIS — J45909 Unspecified asthma, uncomplicated: Secondary | ICD-10-CM | POA: Insufficient documentation

## 2019-06-03 DIAGNOSIS — R0789 Other chest pain: Secondary | ICD-10-CM | POA: Diagnosis not present

## 2019-06-03 DIAGNOSIS — R079 Chest pain, unspecified: Secondary | ICD-10-CM

## 2019-06-03 DIAGNOSIS — R1310 Dysphagia, unspecified: Secondary | ICD-10-CM | POA: Diagnosis not present

## 2019-06-03 DIAGNOSIS — R110 Nausea: Secondary | ICD-10-CM | POA: Diagnosis not present

## 2019-06-03 DIAGNOSIS — K769 Liver disease, unspecified: Secondary | ICD-10-CM | POA: Diagnosis not present

## 2019-06-03 DIAGNOSIS — F17210 Nicotine dependence, cigarettes, uncomplicated: Secondary | ICD-10-CM | POA: Diagnosis not present

## 2019-06-03 DIAGNOSIS — Z01818 Encounter for other preprocedural examination: Secondary | ICD-10-CM | POA: Insufficient documentation

## 2019-06-03 LAB — I-STAT BETA HCG BLOOD, ED (MC, WL, AP ONLY): I-stat hCG, quantitative: <5 m[IU]/mL (ref <5)

## 2019-06-03 LAB — COMPREHENSIVE METABOLIC PANEL
ALT: 17 U/L (ref 0–44)
AST: 15 U/L (ref 15–41)
Albumin: 4 g/dL (ref 3.5–5.0)
Alkaline Phosphatase: 51 U/L (ref 38–126)
Anion gap: 11 (ref 5–15)
BUN: 11 mg/dL (ref 6–20)
CO2: 24 mmol/L (ref 22–32)
Calcium: 9.4 mg/dL (ref 8.9–10.3)
Chloride: 105 mmol/L (ref 98–111)
Creatinine, Ser: 0.87 mg/dL (ref 0.44–1.00)
GFR calc Af Amer: >60 mL/min (ref >60)
GFR calc non Af Amer: >60 mL/min (ref >60)
Glucose, Bld: 95 mg/dL (ref 70–99)
Potassium: 3.6 mmol/L (ref 3.5–5.1)
Sodium: 140 mmol/L (ref 135–145)
Total Bilirubin: 0.6 mg/dL (ref 0.3–1.2)
Total Protein: 8.2 g/dL — ABNORMAL HIGH (ref 6.5–8.1)

## 2019-06-03 LAB — URINALYSIS, ROUTINE W REFLEX MICROSCOPIC
Bilirubin Urine: NEGATIVE
Glucose, UA: NEGATIVE mg/dL
Hgb urine dipstick: NEGATIVE
Ketones, ur: 80 mg/dL — AB
Leukocytes,Ua: NEGATIVE
Nitrite: NEGATIVE
Protein, ur: 30 mg/dL — AB
Specific Gravity, Urine: 1.031 — ABNORMAL HIGH (ref 1.005–1.030)
pH: 6 (ref 5.0–8.0)

## 2019-06-03 LAB — CBC
HCT: 40.9 % (ref 36.0–46.0)
Hemoglobin: 12.6 g/dL (ref 12.0–15.0)
MCH: 26.4 pg (ref 26.0–34.0)
MCHC: 30.8 g/dL (ref 30.0–36.0)
MCV: 85.7 fL (ref 80.0–100.0)
Platelets: 535 10*3/uL — ABNORMAL HIGH (ref 150–400)
RBC: 4.77 MIL/uL (ref 3.87–5.11)
RDW: 14.3 % (ref 11.5–15.5)
WBC: 10.3 10*3/uL (ref 4.0–10.5)
nRBC: 0 % (ref 0.0–0.2)

## 2019-06-03 LAB — LIPASE, BLOOD: Lipase: 28 U/L (ref 11–51)

## 2019-06-03 MED ORDER — HYDROMORPHONE HCL 1 MG/ML IJ SOLN
1.0000 mg | Freq: Once | INTRAMUSCULAR | Status: AC
  Administered 2019-06-03: 1 mg via INTRAVENOUS
  Filled 2019-06-03: qty 1

## 2019-06-03 MED ORDER — ONDANSETRON HCL 4 MG/2ML IJ SOLN
4.0000 mg | Freq: Once | INTRAMUSCULAR | Status: AC
  Administered 2019-06-03: 4 mg via INTRAVENOUS
  Filled 2019-06-03: qty 2

## 2019-06-03 MED ORDER — SODIUM CHLORIDE 0.9% FLUSH
3.0000 mL | Freq: Once | INTRAVENOUS | Status: AC
  Administered 2019-06-03: 3 mL via INTRAVENOUS

## 2019-06-03 MED ORDER — SUPREP BOWEL PREP KIT 17.5-3.13-1.6 GM/177ML PO SOLN: 1.0000 | ORAL | 0 refills | Status: DC

## 2019-06-03 MED ORDER — SODIUM CHLORIDE 0.9 % IV BOLUS
1000.0000 mL | Freq: Once | INTRAVENOUS | Status: AC
  Administered 2019-06-03: 1000 mL via INTRAVENOUS

## 2019-06-03 NOTE — ED Provider Notes (Signed)
[facility] DEPT Provider Note   CSN: [id number] Arrival date & time: 06/03/19  1809     History Chief Complaint  Patient presents with  . Emesis  . Abdominal Pain    Anna Moses is a 44 y.o. female.  HPI She presents for evaluation of ongoing abdominal pain.  She was seen in the ED, 4 days ago for same.  At that time she was evaluated with CT imaging, found to have new liver lesions and the case was discussed with her oncologist who plans on arranging for a liver biopsy.  She was discharged with symptomatic treatment.  She describes her pain today as actually in the center of her chest.  She does not currently have abdominal pain.  She denies fever, chills, cough, shortness of breath, vomiting, diarrhea or dizziness.  There are no other known modifying factors.        Past Medical History:  Diagnosis Date  . Asthma   . Bronchitis   . Depression   . Obesity   . Schizophrenia [facility])     Patient Active Problem List   Diagnosis Date Noted  . Dysphagia 06/03/2019  . Nausea and vomiting 06/03/2019  . Liver lesion 06/03/2019  . Preop examination 06/03/2019    Past Surgical History:  Procedure Laterality Date  . CESAREAN SECTION       OB History   No obstetric history on file.     Family History  Problem Relation Age of Onset  . Crohn's disease Mother   . Liver disease Father   . Clotting disorder Father   . Liver disease Brother     Social History   Tobacco Use  . Smoking status: Current Every Day Smoker    Types: Cigarettes  . Smokeless tobacco: Never Used  Substance Use Topics  . Alcohol use: No  . Drug use: Not Currently    Types: Marijuana    Home Medications Prior to Admission medications   Medication Sig Start Date End Date Taking? Authorizing Provider  HYDROcodone-acetaminophen (NORCO) 5-325 MG tablet Take 1 tablet by mouth every 4 (four) hours as needed for severe pain. 05/31/19  Yes Sherwood Gambler, MD   promethazine (PHENERGAN) 25 MG tablet Take 1 tablet (25 mg total) by mouth every 6 (six) hours as needed for nausea or vomiting. 05/31/19  Yes Sherwood Gambler, MD  Na Sulfate-K Sulfate-Mg Sulf (SUPREP BOWEL PREP KIT) 17.5-3.13-1.6 GM/177ML SOLN Take 1 kit by mouth as directed. For colonoscopy prep 06/03/19   Zehr, Laban Emperor, PA-C  diphenhydrAMINE (BENADRYL) 25 MG tablet Take 1 tablet (25 mg total) by mouth every 6 (six) hours. Patient not taking: Reported on 12/12/2018 10/21/17 05/30/19  Charlesetta Shanks, MD  famotidine (PEPCID) 20 MG tablet Take 1 tablet (20 mg total) by mouth 2 (two) times daily. 04/04/19 05/30/19  Isla Pence, MD  Ibuprofen-diphenhydrAMINE Cit (ADVIL PM) 200-38 MG TABS Take 2 tablets by mouth 2 (two) times daily as needed (headaches).  05/30/19  [provider]  metoCLOPramide (REGLAN) 10 MG tablet Take 1 tablet (10 mg total) by mouth every 6 (six) hours as needed for nausea (nausea/headache). 12/12/18 05/30/19  Antonietta Breach, PA-C    Allergies    Patient has no known allergies.  Review of Systems   Review of Systems  All other systems reviewed and are negative.   Physical Exam Updated Vital Signs BP (!) 162/64 (BP Location: Right Arm)   Pulse 68   Temp 99.8 F (37.7 C) (Oral)  Resp 20   Ht '5\' 1"'$  (1.549 m)   Wt 111.1 kg   LMP 05/16/2019   SpO2 100%   BMI 46.29 kg/m   Physical Exam Vitals and nursing note reviewed.  Constitutional:      General: She is not in acute distress.    Appearance: She is well-developed. She is obese. She is not ill-appearing, toxic-appearing or diaphoretic.  HENT:     Head: Normocephalic and atraumatic.     Right Ear: External ear normal.     Left Ear: External ear normal.  Eyes:     Conjunctiva/sclera: Conjunctivae normal.     Pupils: Pupils are equal, round, and reactive to light.  Neck:     Trachea: Phonation normal.  Cardiovascular:     Rate and Rhythm: Normal rate and regular rhythm.     Heart sounds: Normal heart  sounds.  Pulmonary:     Effort: Pulmonary effort is normal.     Breath sounds: Normal breath sounds.  Chest:     Chest wall: Tenderness (Mid anterior chest, mild) present.  Abdominal:     General: There is no distension.     Palpations: Abdomen is soft.     Tenderness: There is no abdominal tenderness.  Musculoskeletal:        General: Normal range of motion.     Cervical back: Normal range of motion and neck supple.  Skin:    General: Skin is warm and dry.  Neurological:     Mental Status: She is alert and oriented to person, place, and time.     Cranial Nerves: No cranial nerve deficit.     Sensory: No sensory deficit.     Motor: No abnormal muscle tone.     Coordination: Coordination normal.  Psychiatric:        Mood and Affect: Mood normal.        Behavior: Behavior normal.        Thought Content: Thought content normal.        Judgment: Judgment normal.     ED Results / Procedures / Treatments   Labs (all labs ordered are listed, but only abnormal results are displayed) Labs Reviewed  COMPREHENSIVE METABOLIC PANEL - Abnormal; Notable for the following components:      Result Value   Total Protein 8.2 (*)    All other components within normal limits  CBC - Abnormal; Notable for the following components:   Platelets 535 (*)    All other components within normal limits  URINALYSIS, ROUTINE W REFLEX MICROSCOPIC - Abnormal; Notable for the following components:   APPearance HAZY (*)    Specific Gravity, Urine 1.031 (*)    Ketones, ur 80 (*)    Protein, ur 30 (*)    Bacteria, UA RARE (*)    All other components within normal limits  LIPASE, BLOOD  I-STAT BETA HCG BLOOD, ED ([facility], [facility], [facility] ONLY)    EKG EKG Interpretation  Date/Time:  Monday June 03 2019 22:52:36 EST Ventricular Rate:  53 PR Interval:    QRS Duration: 100 QT Interval:  486 QTC Calculation: 457 R Axis:   61 Text Interpretation: Sinus rhythm since last tracing no significant change Confirmed by  Daleen Bo [phone]) on 06/03/2019 11:00:06 PM   Radiology DG Chest Port 1 View  Result Date: 06/03/2019 CLINICAL DATA:  Mid chest pain and nausea since this afternoon EXAM: PORTABLE CHEST 1 VIEW COMPARISON:  CT 05/31/2019, chest radiograph 04/04/2019 FINDINGS: Accounting for low volumes and  patient body habitus, the lungs are clear. No consolidation, features of edema, pneumothorax, or effusion. The cardiomediastinal contours are unremarkable. No acute osseous or soft tissue abnormality. IMPRESSION: No acute cardiopulmonary abnormality. Electronically Signed   By: Lovena Le M.D.   On: 06/03/2019 22:44    Procedures Procedures (including critical care time)  Medications Ordered in ED Medications  sodium chloride flush (NS) 0.9 % injection 3 mL (3 mLs Intravenous Given 06/03/19 2254)  HYDROmorphone (DILAUDID) injection 1 mg (1 mg Intravenous Given 06/03/19 2216)  ondansetron (ZOFRAN) injection 4 mg (4 mg Intravenous Given 06/03/19 2216)  sodium chloride 0.9 % bolus 1,000 mL (1,000 mLs Intravenous New Bag/Given (Non-Interop) 06/03/19 2254)    ED Course  I have reviewed the triage vital signs and the nursing notes.  Pertinent labs & imaging results that were available during my care of the patient were reviewed by me and considered in my medical decision making (see chart for details).  Clinical Course as of Jun 02 2354  Mon Jun 03, 2019  2321 Normal  CBC(!) [EW]  2321 Normal  I-Stat beta hCG blood, ED [EW]  2321 No infiltrate or CHF, interpreted by me  DG Chest [facility] [EW]  2322 Normal except presence of ketones, and protein with rare bacteria.  Also urine specific gravity elevated.  Urinalysis, Routine w reflex microscopic(!) [EW]  2349 Normal except total protein high  Comprehensive metabolic panel(!) [EW]    Clinical Course User Index [EW] Daleen Bo, MD   MDM Rules/Calculators/A&P                       Patient Vitals for the past 24 hrs:  BP Temp Temp src  Pulse Resp SpO2 Height Weight  06/03/19 2118 (!) 162/64 99.8 F (37.7 C) Oral 68 20 100 % '5\' 1"'$  (1.549 m) 111.1 kg  06/03/19 1821 (!) 129/44 98.5 F (36.9 C) Oral 69 (!) 22 100 % - -    11:54 PM3 Reevaluation with update and discussion. After initial assessment and treatment, an updated evaluation reveals she states that her pain is better and has no further complaints.  She states that she has pain medicine and nausea medicine to use at home.Daleen Bo   Medical Decision Making: Nonspecific chest pain, doubt ACS, PE or pneumonia.  Patient being worked up for liver lesions with anticipated biopsy as an outpatient and scheduled upper endoscopy.  No indication for further ED treatment or hospitalization at this time  Anna Moses was evaluated in Emergency Department on 06/03/2019 for the symptoms described in the history of present illness. She was evaluated in the context of the global COVID-19 pandemic, which necessitated consideration that the patient might be at risk for infection with the SARS-CoV-2 virus that causes COVID-19. Institutional protocols and algorithms that pertain to the evaluation of patients at risk for COVID-19 are in a state of rapid change based on information released by regulatory bodies including the CDC and federal and state organizations. These policies and algorithms were followed during the patient's care in the ED.   CRITICAL CARE- yes Performed by: Daleen Bo   Nursing Notes Reviewed/ Care Coordinated Applicable Imaging Reviewed Interpretation of Laboratory Data incorporated into ED treatment  The patient appears reasonably screened and/or stabilized for discharge and I doubt any other medical condition or other [facility] requiring further screening, evaluation, or treatment in the ED at this time prior to discharge.  Plan: Home Medications-continue usual; Home Treatments-gradual advance  diet; return here if the recommended treatment, does not improve  the symptoms; Recommended follow up-GI and oncology as scheduled     Final Clinical Impression(s) / ED Diagnoses Final diagnoses:  Chest pain, unspecified type    Rx / DC Orders ED Discharge Orders    None       Daleen Bo, MD 06/03/19 2356

## 2019-06-03 NOTE — Progress Notes (Signed)
Reviewed and agree with management plan.  Lourdez Mcgahan T. Walton Digilio, MD FACG [facility]  

## 2019-06-03 NOTE — ED Triage Notes (Signed)
Pt went today across the street for an appt where she thought was going to have endoscopy but she didn't. She didn't take her medications and having abd pains with n/v since. Reports this been ongoing thing since before Christmas.

## 2019-06-03 NOTE — ED Notes (Signed)
Pt is a/o vss in no acute distress. Pt states she is no longer nauseous and she is tolerating ice chips well.

## 2019-06-03 NOTE — Progress Notes (Signed)
Anna Moses Female, 44 y.o., 17-Apr-1975 MRN:  [id number] Phone:  [phone] [name]) PCP:  Patient, No Pcp Per Coverage:  Medicaid [place]/Medicaid [place] [facility] 06/25/2019 at 8:30 AM  RE: CT Biopsy Received: Today Message Contents  [name], MD  Garibaldi, [name]  Ok   [place] core liver lesion  R/o met   DDH       Previous Messages   ----- Message -----  From: [name]  Sent: 06/03/2019  8:47 AM EST  To: Ir Procedure Requests  Subject: CT Biopsy                     Procedure: CT Biopsy   Reason: Liver mass, liver lesions concerning for metastatic disease. Requesting biopsy   History:  CT in computer   Provider:  Ledell Peoples IV   Provider Contact:  [phone]

## 2019-06-03 NOTE — Patient Instructions (Signed)
If you are age 42 or older, your body mass index should be between 23-30. Your Body mass index is 46.29 kg/m. If this is out of the aforementioned range listed, please consider follow up with your Primary Care Provider.  If you are age 95 or younger, your body mass index should be between 19-25. Your Body mass index is 46.29 kg/m. If this is out of the aformentioned range listed, please consider follow up with your Primary Care Provider.    You have been scheduled for an endoscopy and colonoscopy. Please follow the written instructions given to you at your visit today. Please pick up your prep supplies at the pharmacy within the next 1-3 days. If you use inhalers (even only as needed), please bring them with you on the day of your procedure.   We have sent the following medications to your pharmacy for you to pick up at your convenience: Suprep   Thank you for choosing me and [facility].  Janett Billow Zehr-PA

## 2019-06-03 NOTE — Telephone Encounter (Signed)
A new pt appt has been scheduled for Anna Moses to see Dr. Lorenso Courier on 2/26 at 1pm. Pt aware to arrive 15 minutes early.

## 2019-06-03 NOTE — Progress Notes (Signed)
06/03/2019 Anna Moses [id number] 1976/02/11   HISTORY OF PRESENT ILLNESS: This is a 44 year old female who is new to our office.  She is here today for follow-up of multiple ER visits.  She tells me that about a week or 2 before Christmas she started having dysphagia.  Says that everything that she eats or drinks feels like it gets stuck.  She is also has a lot of nausea and vomiting and has been able to keep down very little.  She says that her family tells her it looks like she has lost weight.  She reports right upper quadrant abdominal pain.  She tells me that she tends to be somewhat constipated and uses MiraLAX, but recently she had had some diarrhea for several days that was very dark in color.  That has since resolved.  She says that she has seen red blood in her stools on occasion, but nothing with that recently.  She has had multiple ER visits since December.  She has had 3 CT scans of the abdomen/pelvis since September.  Most recent CT scan from just 3 days ago on February 19 showed multiple small liver lesions which are new since the prior CT scan just 2 months prior on December 24.  Says that these are most suspicious for metastatic disease but no primary lesion has been visualized on CT of the abdomen, pelvis, or chest.  She is supposed to be scheduled for biopsy of these lesions in the near future.  She tells me that her mother had colon polyps and has inflammatory bowel disease.  She says that her brother had biliary atresia and had a liver transplant when he was 42 years old.  She says that her father had liver disease, she believes liver cancer.  She was given pain medication in the emergency department as well as promethazine, which she does says help with her nausea.  Lab studies unrevealing except for a mild anemia on recent labs with a hemoglobin of 11.1 grams.  Past Medical History:  Diagnosis Date  . Asthma   . Bronchitis   . Depression   . Obesity   . Schizophrenia  [facility])    Past Surgical History:  Procedure Laterality Date  . CESAREAN SECTION      reports that she has been smoking cigarettes. She has never used smokeless tobacco. She reports previous drug use. Drug: Marijuana. She reports that she does not drink alcohol. family history includes Clotting disorder in her father; Crohn's disease in her mother; Liver disease in her brother and father. No Known Allergies    Outpatient Encounter Medications as of 06/03/2019  Medication Sig  . HYDROcodone-acetaminophen (NORCO) 5-325 MG tablet Take 1 tablet by mouth every 4 (four) hours as needed for severe pain.  . promethazine (PHENERGAN) 25 MG tablet Take 1 tablet (25 mg total) by mouth every 6 (six) hours as needed for nausea or vomiting.  . [DISCONTINUED] bismuth subsalicylate (PEPTO BISMOL) 262 MG/15ML suspension Take 30 mLs by mouth every 6 (six) hours as needed for indigestion.   . [DISCONTINUED] diphenhydrAMINE (BENADRYL) 25 MG tablet Take 1 tablet (25 mg total) by mouth every 6 (six) hours. (Patient not taking: Reported on 12/12/2018)  . [DISCONTINUED] esomeprazole (NEXIUM) 40 MG capsule Take 1 capsule (40 mg total) by mouth daily.  . [DISCONTINUED] famotidine (PEPCID) 20 MG tablet Take 1 tablet (20 mg total) by mouth 2 (two) times daily.  . [DISCONTINUED] Ibuprofen-diphenhydrAMINE Cit (ADVIL PM) 200-38 MG TABS  Take 2 tablets by mouth 2 (two) times daily as needed (headaches).  . [DISCONTINUED] metoCLOPramide (REGLAN) 10 MG tablet Take 1 tablet (10 mg total) by mouth every 6 (six) hours as needed for nausea (nausea/headache).  . [DISCONTINUED] ondansetron (ZOFRAN-ODT) 4 MG disintegrating tablet Take 1 tablet (4 mg total) by mouth every 8 (eight) hours as needed for nausea or vomiting.  . [DISCONTINUED] sucralfate (CARAFATE) 1 g tablet Take 1 tablet (1 g total) by mouth 4 (four) times daily -  with meals and at bedtime.   No facility-administered encounter medications on file as of 06/03/2019.      REVIEW OF SYSTEMS  : All other systems reviewed and negative except where noted in the History of Present Illness.   PHYSICAL EXAM: BP 128/70   Pulse 78   Ht 5\' 1"  (1.549 m)   Wt 245 lb (111.1 kg)   LMP 05/16/2019   BMI 46.29 kg/m  General: Well developed AA female in no acute distress Head: Normocephalic and atraumatic Eyes:  Sclerae anicteric, conjunctiva pink. Ears: Normal auditory acuity Lungs: Clear throughout to auscultation; no increased WOB. Heart: Regular rate and rhythm; no M/R/G. Abdomen: Soft, non-distended.  BS present.  Mild diffuse TTP. Rectal:  Will be done at the time of colonoscopy. Musculoskeletal: Symmetrical with no gross deformities  Skin: No lesions on visible extremities Extremities: No edema  Neurological: Alert oriented x 4, grossly non-focal Psychological:  Alert and cooperative. Normal mood and affect  ASSESSMENT AND PLAN: *44 year old female who is here today with primary complaints of nausea/vomiting and dysphagia.  She also had a recent CT scan that showed multiple small liver lesions that are new since prior CT scan just 2 months earlier and are suspicious for metastatic disease, but no primary lesion or malignancy source has been identified by CT scan imaging.  She is supposed to have a biopsy of these lesions in the near future.  She definitely needs an endoscopy with her associated upper GI symptoms, but I think with this potential unknown malignancy she also needs colonoscopy.  We will plan for EGD and colonoscopy with Dr. Fuller Plan.  **The risks, benefits, and alternatives to EGD and colonoscopy were discussed with the patient and she consents to proceed.   CC:  No ref. provider found

## 2019-06-04 ENCOUNTER — Ambulatory Visit: Payer: Medicaid Other | Admitting: Gastroenterology

## 2019-06-07 ENCOUNTER — Inpatient Hospital Stay: Payer: Medicaid Other

## 2019-06-07 ENCOUNTER — Other Ambulatory Visit: Payer: Self-pay

## 2019-06-07 ENCOUNTER — Inpatient Hospital Stay: Payer: Medicaid Other | Attending: Hematology and Oncology | Admitting: Hematology and Oncology

## 2019-06-07 ENCOUNTER — Encounter: Payer: Self-pay | Admitting: Hematology and Oncology

## 2019-06-07 VITALS — BP 130/88 | HR 112 | Temp 97.9°F | Resp 18 | Ht 61.0 in | Wt 249.2 lb

## 2019-06-07 DIAGNOSIS — D751 Secondary polycythemia: Secondary | ICD-10-CM | POA: Insufficient documentation

## 2019-06-07 DIAGNOSIS — F17210 Nicotine dependence, cigarettes, uncomplicated: Secondary | ICD-10-CM | POA: Insufficient documentation

## 2019-06-07 DIAGNOSIS — Z79899 Other long term (current) drug therapy: Secondary | ICD-10-CM | POA: Diagnosis not present

## 2019-06-07 DIAGNOSIS — D509 Iron deficiency anemia, unspecified: Secondary | ICD-10-CM | POA: Diagnosis not present

## 2019-06-07 DIAGNOSIS — K769 Liver disease, unspecified: Secondary | ICD-10-CM | POA: Diagnosis present

## 2019-06-07 DIAGNOSIS — E669 Obesity, unspecified: Secondary | ICD-10-CM | POA: Diagnosis not present

## 2019-06-07 DIAGNOSIS — R160 Hepatomegaly, not elsewhere classified: Secondary | ICD-10-CM

## 2019-06-07 DIAGNOSIS — F209 Schizophrenia, unspecified: Secondary | ICD-10-CM | POA: Insufficient documentation

## 2019-06-07 DIAGNOSIS — F329 Major depressive disorder, single episode, unspecified: Secondary | ICD-10-CM | POA: Diagnosis not present

## 2019-06-07 DIAGNOSIS — Z0000 Encounter for general adult medical examination without abnormal findings: Secondary | ICD-10-CM

## 2019-06-07 LAB — CMP (CANCER CENTER ONLY)
ALT: 12 U/L (ref 0–44)
AST: 10 U/L — ABNORMAL LOW (ref 15–41)
Albumin: 3.5 g/dL (ref 3.5–5.0)
Alkaline Phosphatase: 54 U/L (ref 38–126)
Anion gap: 7 (ref 5–15)
BUN: 12 mg/dL (ref 6–20)
CO2: 26 mmol/L (ref 22–32)
Calcium: 8.5 mg/dL — ABNORMAL LOW (ref 8.9–10.3)
Chloride: 108 mmol/L (ref 98–111)
Creatinine: 0.89 mg/dL (ref 0.44–1.00)
GFR, Est AFR Am: >60 mL/min (ref >60)
GFR, Estimated: >60 mL/min (ref >60)
Glucose, Bld: 94 mg/dL (ref 70–99)
Potassium: 3.6 mmol/L (ref 3.5–5.1)
Sodium: 141 mmol/L (ref 135–145)
Total Bilirubin: <0.2 mg/dL — ABNORMAL LOW (ref 0.3–1.2)
Total Protein: 7.2 g/dL (ref 6.5–8.1)

## 2019-06-07 LAB — CBC WITH DIFFERENTIAL (CANCER CENTER ONLY)
Abs Immature Granulocytes: 0.03 10*3/uL (ref 0.00–0.07)
Basophils Absolute: 0.1 10*3/uL (ref 0.0–0.1)
Basophils Relative: 1 %
Eosinophils Absolute: 0.1 10*3/uL (ref 0.0–0.5)
Eosinophils Relative: 1 %
HCT: 37.8 % (ref 36.0–46.0)
Hemoglobin: 12 g/dL (ref 12.0–15.0)
Immature Granulocytes: 0 %
Lymphocytes Relative: 47 %
Lymphs Abs: 4 10*3/uL (ref 0.7–4.0)
MCH: 27.2 pg (ref 26.0–34.0)
MCHC: 31.7 g/dL (ref 30.0–36.0)
MCV: 85.7 fL (ref 80.0–100.0)
Monocytes Absolute: 0.4 10*3/uL (ref 0.1–1.0)
Monocytes Relative: 4 %
Neutro Abs: 3.9 10*3/uL (ref 1.7–7.7)
Neutrophils Relative %: 47 %
Platelet Count: 489 10*3/uL — ABNORMAL HIGH (ref 150–400)
RBC: 4.41 MIL/uL (ref 3.87–5.11)
RDW: 14.5 % (ref 11.5–15.5)
WBC Count: 8.5 10*3/uL (ref 4.0–10.5)
nRBC: 0 % (ref 0.0–0.2)

## 2019-06-07 LAB — LACTATE DEHYDROGENASE: LDH: 152 U/L (ref 98–192)

## 2019-06-07 LAB — SAVE SMEAR(SSMR), FOR PROVIDER SLIDE REVIEW

## 2019-06-07 LAB — C-REACTIVE PROTEIN: CRP: 1 mg/dL — ABNORMAL HIGH (ref <1.0)

## 2019-06-07 LAB — SEDIMENTATION RATE: Sed Rate: 22 mm/hr (ref 0–22)

## 2019-06-07 NOTE — Progress Notes (Signed)
[place] [phone]) [phone]   Fax:([phone]) [place] NOTE  Patient Care Team: Patient, No Pcp Per as PCP - General (General Practice) Jonnie Finner, RN as Oncology Nurse Navigator  Hematological/Oncological History # Liver Masses 1) 05/31/2019: presented to [facility] Emergency Department with abdominal pain and vomiting. CT abdomen showed multiple small liver lesions which are new since 12/2018 CT scan and most suspicious for metastatic disease. 2) 06/07/2019: establish care with Dr. Lorenso Courier  3) 06/11/2019: intended date of US guided biopsy of liver lesion.   CHIEF COMPLAINTS/PURPOSE OF CONSULTATION:  "Spots of the Liver "  HISTORY OF PRESENTING ILLNESS:  Anna Moses 44 y.o. female with medical history significant for obesity, schizophrenia, asthma, and depression who presents for evaluation of newly diagnosed liver lesions.   On review of the previous records Anna Moses initially presented to the emergency department on 05/30/2019 with intermittent epigastric abdominal pain that has started 3 days prior.  At the time she noted they were similar to "hunger pains" without radiation.  She was discharged at that time.  On 05/31/2019 the patient return to the emergency department with continued abdominal pain.  At that time she had a CT abdomen pelvis performed which showed multiple small liver lesions which were new since her last CT scan in September 2020.  These were concerning for metastatic disease.  Due to concern for these liver lesions the patient was referred to oncology for further evaluation and management.  On exam today Anna Moses notes that she feels improved from her prior emergency department visit on 05/31/2019.  She reports that she does continue to have some abdominal pain but is mostly focused on the right lower quadrant.  She also notes that she has not had a bowel movement in 5 days, but that she has not had any issues with nausea or  vomiting since discharge from the emergency department.  Patient reports that the symptoms that she has been having have been persistent over the last several months since September 2020.  She notes that she has been having continued stomach pain and episodes of not being able to keep food down.  She notes that she vomits clear vomitus and has issues with a "grabbing pain, in her abdomen.  Other health issues she has includes schizophrenia and bronchitis.  She notes that she has been a consistent smoker since the age of approximately 46 but that of the moment she is attempting to cut back.  Her family history is remarkable for thyroid disease in her mother, liver cancer in her father, mesothelioma in her maternal grandfather, and biliary atresia requiring liver transplant in her brother.   In terms of health maintenance she is currently scheduled for colonoscopy on 06/27/2018.  She notes that it has been over 3 years since she last had a Pap smear.  She denies having any bumps or lumps in her neck, lymph nodes, or breasts.  Otherwise she denies having any fevers, chills, sweats, shortness of breath, or chest pain.  A full 10 point ROS was otherwise negative.  MEDICAL HISTORY:  Past Medical History:  Diagnosis Date  . Asthma   . Bronchitis   . Depression   . Obesity   . Schizophrenia ([place])     SURGICAL HISTORY: Past Surgical History:  Procedure Laterality Date  . CESAREAN SECTION      SOCIAL HISTORY: Social History   Socioeconomic History  . Marital status: Single    Spouse name: Not on  file  . Number of children: 4  . Years of education: Not on file  . Highest education level: Not on file  Occupational History  . Occupation: stay at home mother  Tobacco Use  . Smoking status: Current Every Day Smoker    Types: Cigarettes  . Smokeless tobacco: Never Used  Substance and Sexual Activity  . Alcohol use: No  . Drug use: Not Currently    Types: Marijuana  . Sexual activity: Not on  file  Other Topics Concern  . Not on file  Social History Narrative  . Not on file   Social Determinants of Health   Financial Resource Strain:   . Difficulty of Paying Living Expenses: Not on file  Food Insecurity:   . Worried About Charity fundraiser in the Last Year: Not on file  . Ran Out of Food in the Last Year: Not on file  Transportation Needs:   . Lack of Transportation (Medical): Not on file  . Lack of Transportation (Non-Medical): Not on file  Physical Activity:   . Days of Exercise per Week: Not on file  . Minutes of Exercise per Session: Not on file  Stress:   . Feeling of Stress : Not on file  Social Connections:   . Frequency of Communication with Friends and Family: Not on file  . Frequency of Social Gatherings with Friends and Family: Not on file  . Attends Religious Services: Not on file  . Active Member of Clubs or Organizations: Not on file  . Attends Archivist Meetings: Not on file  . Marital Status: Not on file  Intimate Partner Violence:   . Fear of Current or Ex-Partner: Not on file  . Emotionally Abused: Not on file  . Physically Abused: Not on file  . Sexually Abused: Not on file    FAMILY HISTORY: Family History  Problem Relation Age of Onset  . Crohn's disease Mother   . Liver disease Father   . Clotting disorder Father   . Liver disease Brother     ALLERGIES:  has No Known Allergies.  MEDICATIONS:  Current Outpatient Medications  Medication Sig Dispense Refill  . HYDROcodone-acetaminophen (NORCO) 5-325 MG tablet Take 1 tablet by mouth every 4 (four) hours as needed for severe pain. 10 tablet 0  . Na Sulfate-K Sulfate-Mg Sulf (SUPREP BOWEL PREP KIT) 17.5-3.13-1.6 GM/177ML SOLN Take 1 kit by mouth as directed. For colonoscopy prep 354 mL 0  . promethazine (PHENERGAN) 25 MG tablet Take 1 tablet (25 mg total) by mouth every 6 (six) hours as needed for nausea or vomiting. 10 tablet 0   No current facility-administered  medications for this visit.    REVIEW OF SYSTEMS:   Constitutional: ( - ) fevers, ( - )  chills , ( - ) night sweats Eyes: ( - ) blurriness of vision, ( - ) double vision, ( - ) watery eyes Ears, nose, mouth, throat, and face: ( - ) mucositis, ( - ) sore throat Respiratory: ( - ) cough, ( - ) dyspnea, ( - ) wheezes Cardiovascular: ( - ) palpitation, ( - ) chest discomfort, ( - ) lower extremity swelling Gastrointestinal:  ( - ) nausea, ( + ) heartburn, ( - ) change in bowel habits Skin: ( - ) abnormal skin rashes Lymphatics: ( - ) new lymphadenopathy, ( - ) easy bruising Neurological: ( - ) numbness, ( - ) tingling, ( - ) new weaknesses Behavioral/Psych: ( - ) mood change, ( - )  new changes  All other systems were reviewed with the patient and are negative.  PHYSICAL EXAMINATION: ECOG PERFORMANCE STATUS: 1 - Symptomatic but completely ambulatory  Vitals:   06/07/19 1300  BP: 130/88  Pulse: (!) 112  Resp: 18  Temp: 97.9 F (36.6 C)  SpO2: 100%   Filed Weights   06/07/19 1300  Weight: 249 lb 3.2 oz (113 kg)    GENERAL: well appearing obese African American female in NAD  SKIN: skin color, texture, turgor are normal, no rashes or significant lesions EYES: conjunctiva are pink and non-injected, sclera clear LUNGS: clear to auscultation and percussion with normal breathing effort HEART: regular rate & rhythm and no murmurs and no lower extremity edema ABDOMEN: soft, non-tender, non-distended, normal bowel sounds Musculoskeletal: no cyanosis of digits and no clubbing  PSYCH: alert & oriented x 3, fluent speech NEURO: no focal motor/sensory deficits  LABORATORY DATA:  I have reviewed the data as listed CBC Latest Ref Rng & Units 06/03/2019 05/31/2019 05/30/2019  WBC 4.0 - 10.5 K/uL 10.3 10.4 11.4(H)  Hemoglobin 12.0 - 15.0 g/dL 12.6 11.1(L) 12.0  Hematocrit 36.0 - 46.0 % 40.9 34.7(L) 37.3  Platelets 150 - 400 K/uL 535(H) 472(H) 547(H)    CMP Latest Ref Rng & Units 06/03/2019  05/31/2019 05/30/2019  Glucose 70 - 99 mg/dL 95 112(H) 128(H)  BUN 6 - 20 mg/dL '11 13 8  '$ Creatinine 0.44 - 1.00 mg/dL 0.87 0.94 0.94  Sodium 135 - 145 mmol/L 140 140 140  Potassium 3.5 - 5.1 mmol/L 3.6 3.3(L) 3.4(L)  Chloride 98 - 111 mmol/L 105 105 105  CO2 22 - 32 mmol/L '24 23 24  '$ Calcium 8.9 - 10.3 mg/dL 9.4 9.1 9.6  Total Protein 6.5 - 8.1 g/dL 8.2(H) 7.7 7.7  Total Bilirubin 0.3 - 1.2 mg/dL 0.6 0.5 0.7  Alkaline Phos 38 - 126 U/L 51 45 47  AST 15 - 41 U/L 15 14(L) 14(L)  ALT 0 - 44 U/L '17 13 14     '$ PATHOLOGY: None relevant to review.   RADIOGRAPHIC STUDIES: I have personally reviewed the radiological images as listed and agreed with the findings in the report: small liver lesions concerning for metastatic disease. No clear primary source of these lesions.  CT Chest W Contrast  Result Date: 05/31/2019 CLINICAL DATA:  44 year old female with history of cancer of unknown primary. Staging exam. EXAM: CT CHEST WITH CONTRAST TECHNIQUE: Multidetector CT imaging of the chest was performed during intravenous contrast administration. CONTRAST:  9m OMNIPAQUE IOHEXOL 300 MG/ML  SOLN COMPARISON:  No priors. FINDINGS: Cardiovascular: Heart size is normal. There is no significant pericardial fluid, thickening or pericardial calcification. No significant atherosclerotic disease in the thoracic aorta. No calcified atherosclerotic plaque in the coronary arteries. Mediastinum/Nodes: No pathologically enlarged mediastinal or hilar lymph nodes. Please note that accurate exclusion of hilar adenopathy is limited on noncontrast CT scans. Esophagus is unremarkable in appearance. No axillary lymphadenopathy. Lungs/Pleura: No acute consolidative airspace disease. No pleural effusions. No suspicious appearing pulmonary nodules or masses are noted. Upper Abdomen: Please see separate dictation for contemporaneously obtained CT the abdomen and pelvis obtained 05/31/2019 for full description of findings beneath the  diaphragm. Musculoskeletal: There are no aggressive appearing lytic or blastic lesions noted in the visualized portions of the skeleton. IMPRESSION: 1. No evidence of primary malignancy and no findings to suggest metastatic disease in the thorax. 2. No acute findings in the thorax. Electronically Signed   By: DVinnie LangtonM.D.   On: 05/31/2019  20:57   CT ABDOMEN PELVIS W CONTRAST  Result Date: 05/31/2019 CLINICAL DATA:  Onset nausea, vomiting and diarrhea 05/28/2019. Left side abdominal and epigastric pain. EXAM: CT ABDOMEN AND PELVIS WITH CONTRAST TECHNIQUE: Multidetector CT imaging of the abdomen and pelvis was performed using the standard protocol following bolus administration of intravenous contrast. CONTRAST:  100 mL OMNIPAQUE IOHEXOL 300 MG/ML  SOLN COMPARISON:  CT abdomen and pelvis 02/02/2019, 12/12/2018 and 01/27/2017. FINDINGS: Lower chest: Lung bases clear.  No pleural or pericardial effusion. Hepatobiliary: 2 new hypoattenuating lesions are seen in the right hepatic lobe. On image 16 of series 2, a 1.2 cm lesion is identified on image 24 of series 2, a 1.3 cm lesion is seen. Additional subtle lesions are seen in the right hepatic lobe on image 24 and the left hepatic lobe on image 15. The liver otherwise appears normal. Gallbladder and biliary tree are unremarkable. Pancreas: Unremarkable. No pancreatic ductal dilatation or surrounding inflammatory changes. Spleen: Normal in size without focal abnormality. Adrenals/Urinary Tract: Adrenal glands are unremarkable. Kidneys are normal, without renal calculi, focal lesion, or hydronephrosis. Bladder is unremarkable. Stomach/Bowel: Stomach is within normal limits. Appendix appears normal. No evidence of bowel wall thickening, distention, or inflammatory changes. Vascular/Lymphatic: No significant vascular findings are present. No enlarged abdominal or pelvic lymph nodes. Reproductive: Uterus and bilateral adnexa are unremarkable. Other: None.  Musculoskeletal: No acute or focal abnormality. IMPRESSION: Multiple small liver lesions which are new since the most recent CT scan and most suspicious for metastatic disease. Primary lesion is not visualized. Recommend chest CT for further evaluation. These results were called by telephone at the time of interpretation on 05/31/2019 at 7:30 pm to provider Sherwood Gambler , who verbally acknowledged these results. Electronically Signed   By: Inge Rise M.D.   On: 05/31/2019 19:32   DG Chest Port 1 View  Result Date: 06/03/2019 CLINICAL DATA:  Mid chest pain and nausea since this afternoon EXAM: PORTABLE CHEST 1 VIEW COMPARISON:  CT 05/31/2019, chest radiograph 04/04/2019 FINDINGS: Accounting for low volumes and patient body habitus, the lungs are clear. No consolidation, features of edema, pneumothorax, or effusion. The cardiomediastinal contours are unremarkable. No acute osseous or soft tissue abnormality. IMPRESSION: No acute cardiopulmonary abnormality. Electronically Signed   By: Lovena Le M.D.   On: 06/03/2019 22:44    ASSESSMENT & PLAN Anna Moses 44 y.o. female with medical history significant for obesity, schizophrenia, asthma, and depression who presents for evaluation of newly diagnosed liver lesions. At this time it appears as though this is a malignant process, though we will need tissue diagnosis to confirm.  The spots on the liver are quite small and it is possible that this is a benign process, though biopsy will help to confirm this.  Cancers that can have this type of spread include GYN malignancies, breast cancer, and GI primary.  As such we will order some tumor markers to help [place] identify any possible malignant processes.  Today we will order CEA, CA 19-9, CA-125, AFP as part of initial evaluation.  Additionally she has a longstanding thrombocytosis which is potentially secondary to iron deficiency.  We will order iron panel and inflammatory markers today in order to  determine the etiology.  We will plan to have the patient return if the biopsy results require further work-up or intervention.  In the event that is a benign process we would recommend close follow-up with the primary care provider.  Unfortunately this time the patient does not have  a PCP and with her numerous emergency department visits I think she would benefit from having a consistent primary care provider.  # Liver Masses, Workup Underway --unclear what the primary tumor may be based off the initial imaging --will await the results of the biopsy on 06/11/2019 to determine prognosis and what further evaluation is required. -- today will order CEA, CA 19-9, CA-125, and AFP as part of the initial evaluation. Additionally will collect LDH and a peripheral blood film --given the chronic thrombocytosis will collect iron studies and inflammatory markers --plan to have the patient return following the results of the biopsy to discuss the next steps in the workup.  #Symptom Management --patient is not currently having any N/V/D --abdominal pain is under control with OTC medications, no indications for opioid pain medications at this time --continue to monitor.   Orders Placed This Encounter  Procedures  . CBC with Differential (Cancer Center Only)    Standing Status:   Future    Number of Occurrences:   1    Standing Expiration Date:   06/06/2020  . CMP ([facility] only)    Standing Status:   Future    Number of Occurrences:   1    Standing Expiration Date:   06/06/2020  . Lactate dehydrogenase (LDH)    Standing Status:   Future    Number of Occurrences:   1    Standing Expiration Date:   06/06/2020  . Iron and TIBC    Standing Status:   Future    Number of Occurrences:   1    Standing Expiration Date:   06/06/2020  . Ferritin    Standing Status:   Future    Number of Occurrences:   1    Standing Expiration Date:   06/06/2020  . Sedimentation rate    Standing Status:   Future     Number of Occurrences:   1    Standing Expiration Date:   06/06/2020  . C-reactive protein    Standing Status:   Future    Number of Occurrences:   1    Standing Expiration Date:   06/06/2020  . AFP tumor marker    Standing Status:   Future    Number of Occurrences:   1    Standing Expiration Date:   06/06/2020  . HCG, tumor marker    Standing Status:   Future    Number of Occurrences:   1    Standing Expiration Date:   06/06/2020  . CA 125    Standing Status:   Future    Number of Occurrences:   1    Standing Expiration Date:   06/06/2020  . CA 19.9    Standing Status:   Future    Number of Occurrences:   1    Standing Expiration Date:   06/06/2020  . CEA (IN HOUSE-[facility])    Standing Status:   Future    Number of Occurrences:   1    Standing Expiration Date:   06/06/2020  . Save Smear ([facility])    Standing Status:   Future    Number of Occurrences:   1    Standing Expiration Date:   06/06/2020  . Ambulatory referral to Internal Medicine    Referral Priority:   Routine    Referral Type:   Consultation    Referral Reason:   Specialty Services Required    Requested Specialty:   Internal Medicine    Number of  Visits Requested:   1    All questions were answered. The patient knows to call the clinic with any problems, questions or concerns.  A total of more than 45 minutes were spent on this encounter and over half of that time was spent on counseling and coordination of care as outlined above.   Ledell Peoples, MD Department of Hematology/Oncology [place] [facility] Phone: [phone] Pager: [phone] Email: [email or web address]  06/07/2019 1:57 PM

## 2019-06-08 LAB — CA 125: Cancer Antigen (CA) 125: 9 U/mL (ref 0.0–38.1)

## 2019-06-08 LAB — BETA HCG QUANT (REF LAB): hCG Quant: <1 m[IU]/mL

## 2019-06-08 LAB — AFP TUMOR MARKER: AFP, Serum, Tumor Marker: 1.5 ng/mL (ref 0.0–8.3)

## 2019-06-08 LAB — CANCER ANTIGEN 19-9: CA 19-9: <2 U/mL (ref 0–35)

## 2019-06-10 ENCOUNTER — Telehealth: Payer: Self-pay | Admitting: Hematology and Oncology

## 2019-06-10 ENCOUNTER — Other Ambulatory Visit: Payer: Self-pay | Admitting: Radiology

## 2019-06-10 LAB — CEA (IN HOUSE-CHCC): CEA (CHCC-In House): 1.34 ng/mL (ref 0.00–5.00)

## 2019-06-10 LAB — IRON AND TIBC
Iron: 31 ug/dL — ABNORMAL LOW (ref 41–142)
Saturation Ratios: 8 % — ABNORMAL LOW (ref 21–57)
TIBC: 399 ug/dL (ref 236–444)
UIBC: 368 ug/dL (ref 120–384)

## 2019-06-10 LAB — FERRITIN: Ferritin: 20 ng/mL (ref 11–307)

## 2019-06-10 NOTE — Telephone Encounter (Signed)
Scheduled per 2/26 los. Called and spoke with patient. Confirmed appt  

## 2019-06-11 ENCOUNTER — Ambulatory Visit (HOSPITAL_COMMUNITY)
Admission: RE | Admit: 2019-06-11 | Discharge: 2019-06-11 | Disposition: A | Discharge status: Home / Self Care | Payer: Medicaid Other | Source: Ambulatory Visit | Attending: Hematology and Oncology | Admitting: Hematology and Oncology

## 2019-06-11 ENCOUNTER — Other Ambulatory Visit: Payer: Self-pay

## 2019-06-11 ENCOUNTER — Encounter (HOSPITAL_COMMUNITY): Payer: Self-pay

## 2019-06-11 DIAGNOSIS — R160 Hepatomegaly, not elsewhere classified: Secondary | ICD-10-CM

## 2019-06-11 DIAGNOSIS — Z8379 Family history of other diseases of the digestive system: Secondary | ICD-10-CM | POA: Diagnosis not present

## 2019-06-11 DIAGNOSIS — D134 Benign neoplasm of liver: Secondary | ICD-10-CM | POA: Diagnosis not present

## 2019-06-11 DIAGNOSIS — F17210 Nicotine dependence, cigarettes, uncomplicated: Secondary | ICD-10-CM | POA: Insufficient documentation

## 2019-06-11 DIAGNOSIS — Z79899 Other long term (current) drug therapy: Secondary | ICD-10-CM | POA: Diagnosis not present

## 2019-06-11 LAB — CBC
HCT: 36.1 % (ref 36.0–46.0)
Hemoglobin: 11.5 g/dL — ABNORMAL LOW (ref 12.0–15.0)
MCH: 27.3 pg (ref 26.0–34.0)
MCHC: 31.9 g/dL (ref 30.0–36.0)
MCV: 85.5 fL (ref 80.0–100.0)
Platelets: 490 10*3/uL — ABNORMAL HIGH (ref 150–400)
RBC: 4.22 MIL/uL (ref 3.87–5.11)
RDW: 14.6 % (ref 11.5–15.5)
WBC: 7.4 10*3/uL (ref 4.0–10.5)
nRBC: 0 % (ref 0.0–0.2)

## 2019-06-11 LAB — PROTIME-INR
INR: 0.9 (ref 0.8–1.2)
Prothrombin Time: 12.5 seconds (ref 11.4–15.2)

## 2019-06-11 MED ORDER — FENTANYL CITRATE (PF) 100 MCG/2ML IJ SOLN
INTRAMUSCULAR | Status: AC
  Filled 2019-06-11: qty 2

## 2019-06-11 MED ORDER — MIDAZOLAM HCL 2 MG/2ML IJ SOLN
INTRAMUSCULAR | Status: AC
  Filled 2019-06-11: qty 2

## 2019-06-11 MED ORDER — MIDAZOLAM HCL 2 MG/2ML IJ SOLN
INTRAMUSCULAR | Status: AC | PRN
  Administered 2019-06-11 (×2): 1 mg via INTRAVENOUS

## 2019-06-11 MED ORDER — FENTANYL CITRATE (PF) 100 MCG/2ML IJ SOLN
INTRAMUSCULAR | Status: AC | PRN
  Administered 2019-06-11 (×2): 50 ug via INTRAVENOUS

## 2019-06-11 MED ORDER — GELATIN ABSORBABLE 12-7 MM EX MISC
CUTANEOUS | Status: AC
  Filled 2019-06-11: qty 1

## 2019-06-11 MED ORDER — SODIUM CHLORIDE 0.9 % IV SOLN: INTRAVENOUS | Status: DC

## 2019-06-11 MED ORDER — LIDOCAINE HCL (PF) 1 % IJ SOLN
INTRAMUSCULAR | Status: AC
  Filled 2019-06-11: qty 30

## 2019-06-11 NOTE — Procedures (Signed)
Interventional Radiology Procedure Note  Procedure: US guided liver mass biopsy.    Complications: None Recommendations:  - Follow up path - Do not submerge for 7 days - Routine care   Signed,  Dulcy Fanny. Earleen Newport, DO

## 2019-06-11 NOTE — H&P (Addendum)
Chief Complaint: Patient was seen in consultation today for liver lesion biopsy at the request of Elliott T IV  Referring Physician(s): Dorsey,John T IV  Supervising Physician: Corrie Mckusick  Patient Status: [facility] - Out-pt  History of Present Illness: Anna Moses is a 44 y.o. female   Pt has been having abd pain and nausea x 6 months Was seen in ED 12/2018 CT Ab/Pel  12/2018: negative Again seen in ED 03/2019: CT Ab/Pel: stable- no new findings  Continued intermittent pain for months To ED 05/31/19: CT Ab/Pel: IMPRESSION: Multiple small liver lesions which are new since the most recent CT scan and most suspicious for metastatic disease. Primary lesion is not visualized. Recommend chest CT for further evaluation.  Referred to Dr Loren[facility] for evaluation and management  Scheduled now for biopsy of liver lesion  Past Medical History:  Diagnosis Date  . Asthma   . Bronchitis   . Depression   . Obesity   . Schizophrenia [facility])     Past Surgical History:  Procedure Laterality Date  . CESAREAN SECTION      Allergies: Patient has no known allergies.  Medications: Prior to Admission medications   Medication Sig Start Date End Date Taking? Authorizing Provider  HYDROcodone-acetaminophen (NORCO) 5-325 MG tablet Take 1 tablet by mouth every 4 (four) hours as needed for severe pain. 05/31/19  Yes Sherwood Gambler, MD  promethazine (PHENERGAN) 25 MG tablet Take 1 tablet (25 mg total) by mouth every 6 (six) hours as needed for nausea or vomiting. 05/31/19  Yes Sherwood Gambler, MD  Na Sulfate-K Sulfate-Mg Sulf (SUPREP BOWEL PREP KIT) 17.5-3.13-1.6 GM/177ML SOLN Take 1 kit by mouth as directed. For colonoscopy prep 06/03/19   Zehr, Laban Emperor, PA-C  diphenhydrAMINE (BENADRYL) 25 MG tablet Take 1 tablet (25 mg total) by mouth every 6 (six) hours. Patient not taking: Reported on 12/12/2018 10/21/17 05/30/19  Charlesetta Shanks, MD  famotidine (PEPCID) 20 MG tablet Take 1  tablet (20 mg total) by mouth 2 (two) times daily. 04/04/19 05/30/19  Isla Pence, MD  Ibuprofen-diphenhydrAMINE Cit (ADVIL PM) 200-38 MG TABS Take 2 tablets by mouth 2 (two) times daily as needed (headaches).  05/30/19  [provider]  metoCLOPramide (REGLAN) 10 MG tablet Take 1 tablet (10 mg total) by mouth every 6 (six) hours as needed for nausea (nausea/headache). 12/12/18 05/30/19  Antonietta Breach, PA-C     Family History  Problem Relation Age of Onset  . Crohn's disease Mother   . Liver disease Father   . Clotting disorder Father   . Liver disease Brother     Social History   Socioeconomic History  . Marital status: Single    Spouse name: Not on file  . Number of children: 4  . Years of education: Not on file  . Highest education level: Not on file  Occupational History  . Occupation: stay at home mother  Tobacco Use  . Smoking status: Current Every Day Smoker    Types: Cigarettes  . Smokeless tobacco: Never Used  Substance and Sexual Activity  . Alcohol use: No  . Drug use: Not Currently    Types: Marijuana  . Sexual activity: Not on file  Other Topics Concern  . Not on file  Social History Narrative  . Not on file   Social Determinants of Health   Financial Resource Strain:   . Difficulty of Paying Living Expenses: Not on file  Food Insecurity:   . Worried About [employer] of  Food in the Last Year: Not on file  . Ran Out of Food in the Last Year: Not on file  Transportation Needs:   . Lack of Transportation (Medical): Not on file  . Lack of Transportation (Non-Medical): Not on file  Physical Activity:   . Days of Exercise per Week: Not on file  . Minutes of Exercise per Session: Not on file  Stress:   . Feeling of Stress : Not on file  Social Connections:   . Frequency of Communication with Friends and Family: Not on file  . Frequency of Social Gatherings with Friends and Family: Not on file  . Attends Religious Services: Not on file  . Active  Member of Clubs or Organizations: Not on file  . Attends Archivist Meetings: Not on file  . Marital Status: Not on file    Review of Systems: A 12 point ROS discussed and pertinent positives are indicated in the HPI above.  All other systems are negative.  Review of Systems  Constitutional: Positive for activity change. Negative for fatigue and fever.  Respiratory: Negative for cough and shortness of breath.   Cardiovascular: Negative for chest pain.  Gastrointestinal: Positive for abdominal pain and nausea.  Neurological: Negative for weakness.  Psychiatric/Behavioral: Negative for behavioral problems and confusion.    Vital Signs: BP (!) 140/98   Pulse 90   Temp (!) 97.4 F (36.3 C)   Ht '5\' 1"'$  (1.549 m)   Wt 240 lb (108.9 kg)   LMP 05/16/2019   SpO2 100%   BMI 45.35 kg/m   Physical Exam Vitals reviewed.  Cardiovascular:     Rate and Rhythm: Normal rate and regular rhythm.     Heart sounds: Normal heart sounds.  Pulmonary:     Breath sounds: Normal breath sounds.  Abdominal:     Palpations: Abdomen is soft.     Tenderness: There is no abdominal tenderness.  Musculoskeletal:        General: Normal range of motion.  Skin:    General: Skin is warm and dry.  Neurological:     Mental Status: She is alert and oriented to person, place, and time.  Psychiatric:        Behavior: Behavior normal.        Thought Content: Thought content normal.        Judgment: Judgment normal.     Imaging: CT Chest W Contrast  Result Date: 05/31/2019 CLINICAL DATA:  44 year old female with history of cancer of unknown primary. Staging exam. EXAM: CT CHEST WITH CONTRAST TECHNIQUE: Multidetector CT imaging of the chest was performed during intravenous contrast administration. CONTRAST:  68m OMNIPAQUE IOHEXOL 300 MG/ML  SOLN COMPARISON:  No priors. FINDINGS: Cardiovascular: Heart size is normal. There is no significant pericardial fluid, thickening or pericardial calcification.  No significant atherosclerotic disease in the thoracic aorta. No calcified atherosclerotic plaque in the coronary arteries. Mediastinum/Nodes: No pathologically enlarged mediastinal or hilar lymph nodes. Please note that accurate exclusion of hilar adenopathy is limited on noncontrast CT scans. Esophagus is unremarkable in appearance. No axillary lymphadenopathy. Lungs/Pleura: No acute consolidative airspace disease. No pleural effusions. No suspicious appearing pulmonary nodules or masses are noted. Upper Abdomen: Please see separate dictation for contemporaneously obtained CT the abdomen and pelvis obtained 05/31/2019 for full description of findings beneath the diaphragm. Musculoskeletal: There are no aggressive appearing lytic or blastic lesions noted in the visualized portions of the skeleton. IMPRESSION: 1. No evidence of primary malignancy and no findings to  suggest metastatic disease in the thorax. 2. No acute findings in the thorax. Electronically Signed   By: Vinnie Langton M.D.   On: 05/31/2019 20:57   CT ABDOMEN PELVIS W CONTRAST  Result Date: 05/31/2019 CLINICAL DATA:  Onset nausea, vomiting and diarrhea 05/28/2019. Left side abdominal and epigastric pain. EXAM: CT ABDOMEN AND PELVIS WITH CONTRAST TECHNIQUE: Multidetector CT imaging of the abdomen and pelvis was performed using the standard protocol following bolus administration of intravenous contrast. CONTRAST:  100 mL OMNIPAQUE IOHEXOL 300 MG/ML  SOLN COMPARISON:  CT abdomen and pelvis 02/02/2019, 12/12/2018 and 01/27/2017. FINDINGS: Lower chest: Lung bases clear.  No pleural or pericardial effusion. Hepatobiliary: 2 new hypoattenuating lesions are seen in the right hepatic lobe. On image 16 of series 2, a 1.2 cm lesion is identified on image 24 of series 2, a 1.3 cm lesion is seen. Additional subtle lesions are seen in the right hepatic lobe on image 24 and the left hepatic lobe on image 15. The liver otherwise appears normal. Gallbladder and  biliary tree are unremarkable. Pancreas: Unremarkable. No pancreatic ductal dilatation or surrounding inflammatory changes. Spleen: Normal in size without focal abnormality. Adrenals/Urinary Tract: Adrenal glands are unremarkable. Kidneys are normal, without renal calculi, focal lesion, or hydronephrosis. Bladder is unremarkable. Stomach/Bowel: Stomach is within normal limits. Appendix appears normal. No evidence of bowel wall thickening, distention, or inflammatory changes. Vascular/Lymphatic: No significant vascular findings are present. No enlarged abdominal or pelvic lymph nodes. Reproductive: Uterus and bilateral adnexa are unremarkable. Other: None. Musculoskeletal: No acute or focal abnormality. IMPRESSION: Multiple small liver lesions which are new since the most recent CT scan and most suspicious for metastatic disease. Primary lesion is not visualized. Recommend chest CT for further evaluation. These results were called by telephone at the time of interpretation on 05/31/2019 at 7:30 pm to provider Sherwood Gambler , who verbally acknowledged these results. Electronically Signed   By: Inge Rise M.D.   On: 05/31/2019 19:32   DG Chest Port 1 View  Result Date: 06/03/2019 CLINICAL DATA:  Mid chest pain and nausea since this afternoon EXAM: PORTABLE CHEST 1 VIEW COMPARISON:  CT 05/31/2019, chest radiograph 04/04/2019 FINDINGS: Accounting for low volumes and patient body habitus, the lungs are clear. No consolidation, features of edema, pneumothorax, or effusion. The cardiomediastinal contours are unremarkable. No acute osseous or soft tissue abnormality. IMPRESSION: No acute cardiopulmonary abnormality. Electronically Signed   By: Lovena Le M.D.   On: 06/03/2019 22:44    Labs:  CBC: Recent Labs    05/31/19 1741 06/03/19 2230 06/07/19 1355 06/11/19 1145  WBC 10.4 10.3 8.5 7.4  HGB 11.1* 12.6 12.0 11.5*  HCT 34.7* 40.9 37.8 36.1  PLT 472* 535* 489* 490*    COAGS: Recent Labs     06/11/19 1145  INR 0.9    BMP: Recent Labs    05/30/19 1233 05/31/19 1741 06/03/19 2230 06/07/19 1355  NA 140 140 140 141  K 3.4* 3.3* 3.6 3.6  CL 105 105 105 108  CO2 '24 23 24 26  '$ GLUCOSE 128* 112* 95 94  BUN '8 13 11 12  '$ CALCIUM 9.6 9.1 9.4 8.5*  CREATININE 0.94 0.94 0.87 0.89  GFRNONAA >60 >60 >60 >60  GFRAA >60 >60 >60 >60    LIVER FUNCTION TESTS: Recent Labs    05/30/19 1233 05/31/19 1741 06/03/19 2230 06/07/19 1355  BILITOT 0.7 0.5 0.6 <0.2*  AST 14* 14* 15 10*  ALT '14 13 17 12  '$ ALKPHOS 47 45 51  54  PROT 7.7 7.7 8.2* 7.2  ALBUMIN 3.8 3.9 4.0 3.5    TUMOR MARKERS: No results for input(s): AFPTM, CEA, CA199, CHROMGRNA in the last 8760 hours.  Assessment and Plan:  Intermittent abd pain for months Imaging revealing liver lesions suspicious for metastasis Scheduled now for liver lesion biopsy Risks and benefits of liver lesion biopsy was discussed with the patient and/or patient's family including, but not limited to bleeding, infection, damage to adjacent structures or low yield requiring additional tests.  All of the questions were answered and there is agreement to proceed. Consent signed and in chart.   Thank you for this interesting consult.  I greatly enjoyed meeting Deziray Nabi and look forward to participating in their care.  A copy of this report was sent to the requesting provider on this date.  Electronically Signed: Lavonia Drafts, PA-C 06/11/2019, 12:48 PM   I spent a total of  30 Minutes   in face to face in clinical consultation, greater than 50% of which was counseling/coordinating care for liver lesion biopsy

## 2019-06-11 NOTE — Discharge Instructions (Addendum)

## 2019-06-14 LAB — SURGICAL PATHOLOGY

## 2019-06-21 ENCOUNTER — Other Ambulatory Visit: Payer: Self-pay

## 2019-06-21 ENCOUNTER — Inpatient Hospital Stay: Payer: Medicaid Other

## 2019-06-21 ENCOUNTER — Inpatient Hospital Stay: Payer: Medicaid Other | Attending: Hematology and Oncology | Admitting: Hematology and Oncology

## 2019-06-21 VITALS — BP 112/70 | HR 80 | Temp 97.8°F | Resp 18 | Ht 61.0 in | Wt 248.9 lb

## 2019-06-21 DIAGNOSIS — D7218 Eosinophilia in diseases classified elsewhere: Secondary | ICD-10-CM

## 2019-06-21 DIAGNOSIS — F329 Major depressive disorder, single episode, unspecified: Secondary | ICD-10-CM | POA: Diagnosis not present

## 2019-06-21 DIAGNOSIS — K769 Liver disease, unspecified: Secondary | ICD-10-CM | POA: Diagnosis not present

## 2019-06-21 DIAGNOSIS — R160 Hepatomegaly, not elsewhere classified: Secondary | ICD-10-CM

## 2019-06-21 DIAGNOSIS — E669 Obesity, unspecified: Secondary | ICD-10-CM | POA: Insufficient documentation

## 2019-06-21 DIAGNOSIS — F17210 Nicotine dependence, cigarettes, uncomplicated: Secondary | ICD-10-CM | POA: Insufficient documentation

## 2019-06-21 DIAGNOSIS — F209 Schizophrenia, unspecified: Secondary | ICD-10-CM | POA: Diagnosis not present

## 2019-06-21 DIAGNOSIS — R109 Unspecified abdominal pain: Secondary | ICD-10-CM | POA: Diagnosis not present

## 2019-06-21 DIAGNOSIS — J45909 Unspecified asthma, uncomplicated: Secondary | ICD-10-CM | POA: Diagnosis not present

## 2019-06-21 LAB — CBC WITH DIFFERENTIAL (CANCER CENTER ONLY)
Abs Immature Granulocytes: 0.02 10*3/uL (ref 0.00–0.07)
Basophils Absolute: 0.1 10*3/uL (ref 0.0–0.1)
Basophils Relative: 1 %
Eosinophils Absolute: 0 10*3/uL (ref 0.0–0.5)
Eosinophils Relative: 1 %
HCT: 37.5 % (ref 36.0–46.0)
Hemoglobin: 11.9 g/dL — ABNORMAL LOW (ref 12.0–15.0)
Immature Granulocytes: 0 %
Lymphocytes Relative: 29 %
Lymphs Abs: 2.5 10*3/uL (ref 0.7–4.0)
MCH: 27 pg (ref 26.0–34.0)
MCHC: 31.7 g/dL (ref 30.0–36.0)
MCV: 85 fL (ref 80.0–100.0)
Monocytes Absolute: 0.5 10*3/uL (ref 0.1–1.0)
Monocytes Relative: 5 %
Neutro Abs: 5.8 10*3/uL (ref 1.7–7.7)
Neutrophils Relative %: 64 %
Platelet Count: 548 10*3/uL — ABNORMAL HIGH (ref 150–400)
RBC: 4.41 MIL/uL (ref 3.87–5.11)
RDW: 14.3 % (ref 11.5–15.5)
WBC Count: 8.9 10*3/uL (ref 4.0–10.5)
nRBC: 0 % (ref 0.0–0.2)

## 2019-06-21 LAB — CMP (CANCER CENTER ONLY)
ALT: 11 U/L (ref 0–44)
AST: 11 U/L — ABNORMAL LOW (ref 15–41)
Albumin: 3.7 g/dL (ref 3.5–5.0)
Alkaline Phosphatase: 63 U/L (ref 38–126)
Anion gap: 8 (ref 5–15)
BUN: 11 mg/dL (ref 6–20)
CO2: 24 mmol/L (ref 22–32)
Calcium: 9.1 mg/dL (ref 8.9–10.3)
Chloride: 107 mmol/L (ref 98–111)
Creatinine: 0.85 mg/dL (ref 0.44–1.00)
GFR, Est AFR Am: >60 mL/min (ref >60)
GFR, Estimated: >60 mL/min (ref >60)
Glucose, Bld: 104 mg/dL — ABNORMAL HIGH (ref 70–99)
Potassium: 4.1 mmol/L (ref 3.5–5.1)
Sodium: 139 mmol/L (ref 135–145)
Total Bilirubin: 0.4 mg/dL (ref 0.3–1.2)
Total Protein: 7.5 g/dL (ref 6.5–8.1)

## 2019-06-21 LAB — VITAMIN B12: Vitamin B-12: 233 pg/mL (ref 180–914)

## 2019-06-21 LAB — LACTATE DEHYDROGENASE: LDH: 150 U/L (ref 98–192)

## 2019-06-21 LAB — SAVE SMEAR(SSMR), FOR PROVIDER SLIDE REVIEW

## 2019-06-21 NOTE — Progress Notes (Signed)
[place] [phone]   [phone]  PROGRESS NOTE  Patient Care Team: Patient, No Pcp Per as PCP - General (General Practice)  Hematological/Oncological History # Liver Masses 1) 05/31/2019: presented to [facility] with abdominal pain and vomiting. CT abdomen showed multiple small liver lesions which are new since 12/2018 CT scan and most suspicious for metastatic disease. 2) 06/07/2019: establish care with Dr. Lorenso Courier  3) 06/11/2019:  US guided biopsy of liver lesion, findings consistent with localized inflammation   Interval History:  Anna Moses 44 y.o. female with medical history significant for liver masses of unclear etiology who presents for a follow up visit. The patient's last visit was on 06/07/2019 at which time she established care. In the interim since the last visit she had a biopsy on 06/11/2019 which showed nonspecific inflammation and no clear malignant cells.   On exam today Anna Moses notes that she feels well.  She notes that she has not had any major issues since her last visit with [place] on 06/07/2019.  She notes that she does occasionally have some pain on the right side of her abdomen which can be 8 at 10 in severity.  The pain is fluctuant and does not appear related to eating or bowel movements.  She notes that she has not had any nausea, vomiting, or diarrhea.  She has had no issues constipation.  She does report that she has developed no lymphadenopathy, skin rashes, or weight loss since her last visit.  Given concern for this eosinophilic infiltrate in the liver we discussed possible risks parasitic infection.  The patient does not have a cat or contact with cats.  She also notes that she "burns" her food and that she does not eat any raw meats or fish.  She also does not walk outside barefoot and has not been close with anyone else that has been sick recently.  Overall she is at her baseline stable level of  health.  A full 10 point ROS is listed below  MEDICAL HISTORY:  Past Medical History:  Diagnosis Date   Asthma    Bronchitis    Depression    Obesity    Schizophrenia ([place])     SURGICAL HISTORY: Past Surgical History:  Procedure Laterality Date   CESAREAN SECTION      SOCIAL HISTORY: Social History   Socioeconomic History   Marital status: Single    Spouse name: Not on file   Number of children: 4   Years of education: Not on file   Highest education level: Not on file  Occupational History   Occupation: stay at home mother  Tobacco Use   Smoking status: Current Every Day Smoker    Types: Cigarettes   Smokeless tobacco: Never Used  Substance and Sexual Activity   Alcohol use: No   Drug use: Not Currently    Types: Marijuana   Sexual activity: Not on file  Other Topics Concern   Not on file  Social History Narrative   Not on file   Social Determinants of Health   Financial Resource Strain:    Difficulty of Paying Living Expenses:   Food Insecurity:    Worried About Charity fundraiser in the Last Year:    Arboriculturist in the Last Year:   Transportation Needs:    Lack of Transportation (Medical):    Lack of Transportation (Non-Medical):   Physical Activity:    Days of Exercise per  Week:    Minutes of Exercise per Session:   Stress:    Feeling of Stress :   Social Connections:    Frequency of Communication with Friends and Family:    Frequency of Social Gatherings with Friends and Family:    Attends Religious Services:    Active Member of Clubs or Organizations:    Attends Music therapist:    Marital Status:   Intimate Partner Violence:    Fear of Current or Ex-Partner:    Emotionally Abused:    Physically Abused:    Sexually Abused:     FAMILY HISTORY: Family History  Problem Relation Age of Onset   Crohn's disease Mother    Liver disease Father    Clotting disorder Father    Liver  disease Brother     ALLERGIES:  has No Known Allergies.  MEDICATIONS:  Current Outpatient Medications  Medication Sig Dispense Refill   HYDROcodone-acetaminophen (NORCO) 5-325 MG tablet Take 1 tablet by mouth every 4 (four) hours as needed for severe pain. 10 tablet 0   Na Sulfate-K Sulfate-Mg Sulf (SUPREP BOWEL PREP KIT) 17.5-3.13-1.6 GM/177ML SOLN Take 1 kit by mouth as directed. For colonoscopy prep 354 mL 0   promethazine (PHENERGAN) 25 MG tablet Take 1 tablet (25 mg total) by mouth every 6 (six) hours as needed for nausea or vomiting. 10 tablet 0   No current facility-administered medications for this visit.    REVIEW OF SYSTEMS:   Constitutional: ( - ) fevers, ( - )  chills , ( - ) night sweats Eyes: ( - ) blurriness of vision, ( - ) double vision, ( - ) watery eyes Ears, nose, mouth, throat, and face: ( - ) mucositis, ( - ) sore throat Respiratory: ( - ) cough, ( - ) dyspnea, ( - ) wheezes Cardiovascular: ( - ) palpitation, ( - ) chest discomfort, ( - ) lower extremity swelling Gastrointestinal:  ( - ) nausea, ( - ) heartburn, ( - ) change in bowel habits Skin: ( - ) abnormal skin rashes Lymphatics: ( - ) new lymphadenopathy, ( - ) easy bruising Neurological: ( - ) numbness, ( - ) tingling, ( - ) new weaknesses Behavioral/Psych: ( - ) mood change, ( - ) new changes  All other systems were reviewed with the patient and are negative.  PHYSICAL EXAMINATION: ECOG PERFORMANCE STATUS: 0 - Asymptomatic  Vitals:   06/21/19 0816  BP: 112/70  Pulse: 80  Resp: 18  Temp: 97.8 F (36.6 C)  SpO2: 100%   Filed Weights   06/21/19 0816  Weight: 248 lb 14.4 oz (112.9 kg)    GENERAL: well appearing middle aged [place] American female, alert, no distress and comfortable SKIN: skin color, texture, turgor are normal, no rashes or significant lesions EYES: conjunctiva are pink and non-injected, sclera clear LUNGS: clear to auscultation and percussion with normal breathing  effort HEART: regular rate & rhythm and no murmurs and no lower extremity edema ABDOMEN: soft, non-tender, non-distended, normal bowel sounds Musculoskeletal: no cyanosis of digits and no clubbing  PSYCH: alert & oriented x 3, fluent speech NEURO: no focal motor/sensory deficits  LABORATORY DATA:  I have reviewed the data as listed CBC Latest Ref Rng & Units 06/21/2019 06/11/2019 06/07/2019  WBC 4.0 - 10.5 K/uL 8.9 7.4 8.5  Hemoglobin 12.0 - 15.0 g/dL 11.9(L) 11.5(L) 12.0  Hematocrit 36.0 - 46.0 % 37.5 36.1 37.8  Platelets 150 - 400 K/uL 548(H) 490(H) 489(H)  CMP Latest Ref Rng & Units 06/21/2019 06/07/2019 06/03/2019  Glucose 70 - 99 mg/dL 104(H) 94 95  BUN 6 - 20 mg/dL '11 12 11  '$ Creatinine 0.44 - 1.00 mg/dL 0.85 0.89 0.87  Sodium 135 - 145 mmol/L 139 141 140  Potassium 3.5 - 5.1 mmol/L 4.1 3.6 3.6  Chloride 98 - 111 mmol/L 107 108 105  CO2 22 - 32 mmol/L '24 26 24  '$ Calcium 8.9 - 10.3 mg/dL 9.1 8.5(L) 9.4  Total Protein 6.5 - 8.1 g/dL 7.5 7.2 8.2(H)  Total Bilirubin 0.3 - 1.2 mg/dL 0.4 <0.2(L) 0.6  Alkaline Phos 38 - 126 U/L 63 54 51  AST 15 - 41 U/L 11(L) 10(L) 15  ALT 0 - 44 U/L '11 12 17    '$ RADIOGRAPHIC STUDIES: I have personally reviewed the radiological images as listed and agreed with the findings in the report: reviewed previously, liver lesions noted.   CT Chest W Contrast  Result Date: 05/31/2019 CLINICAL DATA:  44 year old female with history of cancer of unknown primary. Staging exam. EXAM: CT CHEST WITH CONTRAST TECHNIQUE: Multidetector CT imaging of the chest was performed during intravenous contrast administration. CONTRAST:  27m OMNIPAQUE IOHEXOL 300 MG/ML  SOLN COMPARISON:  No priors. FINDINGS: Cardiovascular: Heart size is normal. There is no significant pericardial fluid, thickening or pericardial calcification. No significant atherosclerotic disease in the thoracic aorta. No calcified atherosclerotic plaque in the coronary arteries. Mediastinum/Nodes: No  pathologically enlarged mediastinal or hilar lymph nodes. Please note that accurate exclusion of hilar adenopathy is limited on noncontrast CT scans. Esophagus is unremarkable in appearance. No axillary lymphadenopathy. Lungs/Pleura: No acute consolidative airspace disease. No pleural effusions. No suspicious appearing pulmonary nodules or masses are noted. Upper Abdomen: Please see separate dictation for contemporaneously obtained CT the abdomen and pelvis obtained 05/31/2019 for full description of findings beneath the diaphragm. Musculoskeletal: There are no aggressive appearing lytic or blastic lesions noted in the visualized portions of the skeleton. IMPRESSION: 1. No evidence of primary malignancy and no findings to suggest metastatic disease in the thorax. 2. No acute findings in the thorax. Electronically Signed   By: DVinnie LangtonM.D.   On: 05/31/2019 20:57   CT ABDOMEN PELVIS W CONTRAST  Result Date: 05/31/2019 CLINICAL DATA:  Onset nausea, vomiting and diarrhea 05/28/2019. Left side abdominal and epigastric pain. EXAM: CT ABDOMEN AND PELVIS WITH CONTRAST TECHNIQUE: Multidetector CT imaging of the abdomen and pelvis was performed using the standard protocol following bolus administration of intravenous contrast. CONTRAST:  100 mL OMNIPAQUE IOHEXOL 300 MG/ML  SOLN COMPARISON:  CT abdomen and pelvis 02/02/2019, 12/12/2018 and 01/27/2017. FINDINGS: Lower chest: Lung bases clear.  No pleural or pericardial effusion. Hepatobiliary: 2 new hypoattenuating lesions are seen in the right hepatic lobe. On image 16 of series 2, a 1.2 cm lesion is identified on image 24 of series 2, a 1.3 cm lesion is seen. Additional subtle lesions are seen in the right hepatic lobe on image 24 and the left hepatic lobe on image 15. The liver otherwise appears normal. Gallbladder and biliary tree are unremarkable. Pancreas: Unremarkable. No pancreatic ductal dilatation or surrounding inflammatory changes. Spleen: Normal in  size without focal abnormality. Adrenals/Urinary Tract: Adrenal glands are unremarkable. Kidneys are normal, without renal calculi, focal lesion, or hydronephrosis. Bladder is unremarkable. Stomach/Bowel: Stomach is within normal limits. Appendix appears normal. No evidence of bowel wall thickening, distention, or inflammatory changes. Vascular/Lymphatic: No significant vascular findings are present. No enlarged abdominal or pelvic lymph nodes. Reproductive: Uterus and  bilateral adnexa are unremarkable. Other: None. Musculoskeletal: No acute or focal abnormality. IMPRESSION: Multiple small liver lesions which are new since the most recent CT scan and most suspicious for metastatic disease. Primary lesion is not visualized. Recommend chest CT for further evaluation. These results were called by telephone at the time of interpretation on 05/31/2019 at 7:30 pm to provider Sherwood Gambler , who verbally acknowledged these results. Electronically Signed   By: Inge Rise M.D.   On: 05/31/2019 19:32   DG Chest Port 1 View  Result Date: 06/03/2019 CLINICAL DATA:  Mid chest pain and nausea since this afternoon EXAM: PORTABLE CHEST 1 VIEW COMPARISON:  CT 05/31/2019, chest radiograph 04/04/2019 FINDINGS: Accounting for low volumes and patient body habitus, the lungs are clear. No consolidation, features of edema, pneumothorax, or effusion. The cardiomediastinal contours are unremarkable. No acute osseous or soft tissue abnormality. IMPRESSION: No acute cardiopulmonary abnormality. Electronically Signed   By: Lovena Le M.D.   On: 06/03/2019 22:44   [place] CORE BIOPSY (LIVER)  Result Date: 06/11/2019 INDICATION: 44 year old female referred for targeted liver mass biopsy EXAM: ULTRASOUND-GUIDED RIGHT LIVER MASS BIOPSY MEDICATIONS: None. ANESTHESIA/SEDATION: Moderate (conscious) sedation was employed during this procedure. A total of Versed 2.0 mg and Fentanyl 100 mcg was administered intravenously. Moderate Sedation  Time: 18 minutes. The patient's level of consciousness and vital signs were monitored continuously by radiology nursing throughout the procedure under my direct supervision. FLUOROSCOPY TIME:  None COMPLICATIONS: None PROCEDURE: The procedure, risks, benefits, and alternatives were explained to the patient. Questions regarding the procedure were encouraged and answered. The patient understands and consents to the procedure. Ultrasound survey of the right liver lobe performed with images stored and sent to PACs. The right lower thorax/right upper abdomen was prepped with chlorhexidine in a sterile fashion, and a sterile drape was applied covering the operative field. A sterile gown and sterile gloves were used for the procedure. Local anesthesia was provided with 1% Lidocaine. Once the patient is prepped and draped sterilely and the skin and subcutaneous tissues were generously infiltrated with 1% lidocaine, a small stab incision was made with an 11 blade scalpel. A 17 gauge introducer needle was advanced under ultrasound guidance in an intercostal location into the right liver lobe, targeting small hypoechoic lesion of the right liver. The stylet was removed, and 4 separate 18 gauge core biopsy were retrieved of the mass. Samples were placed into formalin for transportation to the lab. Gel-Foam pledgets were then infused with a small amount of saline for assistance with hemostasis. The needle was removed, and a final ultrasound image was performed. The patient tolerated the procedure well and remained hemodynamically stable throughout. No complications were encountered and no significant blood loss was encounter. IMPRESSION: Status post ultrasound-guided biopsy of small hypoechoic right liver lesion. Signed, Dulcy Fanny. Dellia Nims, RPVI [facility] Radiology Electronically Signed   By: Corrie Mckusick D.O.   On: 06/11/2019 16:38    ASSESSMENT & PLAN Anna Moses  44 y.o. female with medical history significant for liver masses of unclear etiology who presents for a follow up visit.  In the interim since the last visit the patient had a biopsy of the liver lesion which returned as inflammatory infiltrate containing eosinophils.  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 2019;13(2):183-190).    In order to progress the work-up today we will order rheumatological and infectious work-up to determine if there is some form of disorder provoking eosinophils.  If this work-up is unrevealing I would recommend that we could either rebiopsy the lesion, or perform short interval imaging to assure that there has been no progression.  I discussed these findings with the patient today who voiced her understanding.  #Liver Masses --initial results of the biopsy show inflammatory infiltrate with eosinophils. This could represent tissue proximal to a malignancy vs eosinophilic liver infiltration.  --today will order studies to assess for eosinophilic conditions, including ANA, ANCA, vitamin b12, tryptase, and parasitology studies. --in the event none of these studies are revealing, we can consider 1) repeat biopsy or 2) short interval re-imaging. At this time I favor an approach of re-imaging. Will discuss with patient after the above labs return. --placeholder RTC in 2 months with CT scan order for mid April 2021. RTC sooner if abnormality noted in the above studies.   Orders Placed This Encounter  Procedures   Fecal, O&P    Standing Status:   Future    Standing Expiration Date:   06/20/2020   CBC with Differential (Cancer Center Only)    Standing Status:   Future    Number of Occurrences:   1    Standing Expiration Date:   06/20/2020   Save Smear (SSMR)    Standing Status:    Future    Number of Occurrences:   1    Standing Expiration Date:   06/20/2020   CMP ([place] only)    Standing Status:   Future    Number of Occurrences:   1    Standing Expiration Date:   06/20/2020   Lactate dehydrogenase (LDH)    Standing Status:   Future    Number of Occurrences:   1    Standing Expiration Date:   06/20/2020   Vitamin B12    Standing Status:   Future    Number of Occurrences:   1    Standing Expiration Date:   06/20/2020   Tryptase    Standing Status:   Future    Number of Occurrences:   1    Standing Expiration Date:   06/20/2020   Strongyloides antibody   IgE    Standing Status:   Future    Number of Occurrences:   1    Standing Expiration Date:   06/20/2020   ANA, IFA (with reflex)    Standing Status:   Future    Number of Occurrences:   1    Standing Expiration Date:   06/20/2020   ANCA [place]    Standing Status:   Future    Number of Occurrences:   1    Standing Expiration Date:   06/20/2020    All questions were answered. The patient knows to call the clinic with any problems, questions or concerns.  A total of more than 30 minutes were spent on this encounter and over half of that time was spent on counseling and coordination of care as outlined above.   Ledell Peoples, MD Department of Hematology/Oncology [place] at [facility] Phone: [phone] Pager: [phone] Email: [email or web address]  06/21/2019 10:43 AM   Literature Support:  Sim DW, Son DJ, Roslyn Smiling, Jun [facility] What Are the Clinical Features and Etiology of Eosinophilic Liver Infiltration?. Gut Liver.  2019;13(2):183-190. [id number]  --Eosinophilic liver infiltration (ELI) can be observed on computed tomography (CT), and represents a common, focal, eosinophil-related inflammation, with or without necrosis. The lesions appear on CT scans as multiple, hypoattenuated, small, round lesions that have blurred margins  and are mainly discerned during the portal phase.

## 2019-06-23 LAB — TRYPTASE: Tryptase: 5.4 ug/L (ref 2.2–13.2)

## 2019-06-24 ENCOUNTER — Telehealth: Payer: Self-pay | Admitting: Hematology and Oncology

## 2019-06-24 LAB — ANCA TITERS
Atypical P-ANCA titer: <1:20 {titer}
C-ANCA: <1:20 {titer}
P-ANCA: <1:20 {titer}

## 2019-06-24 LAB — ANTINUCLEAR ANTIBODIES, IFA: ANA Ab, IFA: NEGATIVE

## 2019-06-24 LAB — IGE: IgE (Immunoglobulin E), Serum: 561 IU/mL — ABNORMAL HIGH (ref 6–495)

## 2019-06-24 NOTE — Telephone Encounter (Signed)
Scheduled per 3/12 los. Called and spoke with patient. Confirmed appt

## 2019-06-25 ENCOUNTER — Ambulatory Visit (INDEPENDENT_AMBULATORY_CARE_PROVIDER_SITE_OTHER): Payer: Medicaid Other

## 2019-06-25 ENCOUNTER — Other Ambulatory Visit: Payer: Self-pay

## 2019-06-25 ENCOUNTER — Other Ambulatory Visit: Payer: Self-pay | Admitting: Gastroenterology

## 2019-06-25 DIAGNOSIS — D7218 Eosinophilia in diseases classified elsewhere: Secondary | ICD-10-CM

## 2019-06-25 DIAGNOSIS — Z1159 Encounter for screening for other viral diseases: Secondary | ICD-10-CM

## 2019-06-25 DIAGNOSIS — K769 Liver disease, unspecified: Secondary | ICD-10-CM | POA: Diagnosis not present

## 2019-06-25 LAB — SARS CORONAVIRUS 2 (TAT 6-24 HRS): SARS Coronavirus 2: NEGATIVE

## 2019-06-27 ENCOUNTER — Encounter: Payer: Self-pay | Admitting: Gastroenterology

## 2019-06-27 ENCOUNTER — Other Ambulatory Visit: Payer: Self-pay

## 2019-06-27 ENCOUNTER — Ambulatory Visit (AMBULATORY_SURGERY_CENTER): Payer: Medicaid Other | Admitting: Gastroenterology

## 2019-06-27 VITALS — BP 124/83 | HR 77 | Temp 97.5°F | Resp 14 | Ht 61.0 in | Wt 245.0 lb

## 2019-06-27 DIAGNOSIS — R112 Nausea with vomiting, unspecified: Secondary | ICD-10-CM

## 2019-06-27 DIAGNOSIS — D125 Benign neoplasm of sigmoid colon: Secondary | ICD-10-CM | POA: Diagnosis not present

## 2019-06-27 DIAGNOSIS — R935 Abnormal findings on diagnostic imaging of other abdominal regions, including retroperitoneum: Secondary | ICD-10-CM

## 2019-06-27 DIAGNOSIS — R1310 Dysphagia, unspecified: Secondary | ICD-10-CM

## 2019-06-27 DIAGNOSIS — K3189 Other diseases of stomach and duodenum: Secondary | ICD-10-CM | POA: Diagnosis not present

## 2019-06-27 DIAGNOSIS — D122 Benign neoplasm of ascending colon: Secondary | ICD-10-CM

## 2019-06-27 DIAGNOSIS — D123 Benign neoplasm of transverse colon: Secondary | ICD-10-CM | POA: Diagnosis not present

## 2019-06-27 DIAGNOSIS — K2970 Gastritis, unspecified, without bleeding: Secondary | ICD-10-CM | POA: Diagnosis not present

## 2019-06-27 HISTORY — PX: COLONOSCOPY: SHX174

## 2019-06-27 HISTORY — PX: UPPER GASTROINTESTINAL ENDOSCOPY: SHX188

## 2019-06-27 MED ORDER — PANTOPRAZOLE SODIUM 40 MG PO TBEC: 40.0000 mg | DELAYED_RELEASE_TABLET | Freq: Two times a day (BID) | ORAL | 2 refills | Status: DC

## 2019-06-27 MED ORDER — PANTOPRAZOLE SODIUM 40 MG PO TBEC: 40.0000 mg | DELAYED_RELEASE_TABLET | Freq: Every day | ORAL | 12 refills | Status: DC

## 2019-06-27 MED ORDER — SODIUM CHLORIDE 0.9 % IV SOLN: 500.0000 mL | Freq: Once | INTRAVENOUS | Status: DC

## 2019-06-27 NOTE — Op Note (Signed)
[place] Patient Name: Anna Moses Procedure Date: 06/27/2019 10:37 AM MRN: [id number] Endoscopist: Ladene Artist , MD Age: 44 Referring MD:  Date of Birth: [birth year] Gender: Female Account #: [id number] Procedure:                Upper GI endoscopy Indications:              Dysphagia, Nausea with vomiting Medicines:                Monitored Anesthesia Care Procedure:                Pre-Anesthesia Assessment:                           - Prior to the procedure, a History and Physical                            was performed, and patient medications and                            allergies were reviewed. The patient's tolerance of                            previous anesthesia was also reviewed. The risks                            and benefits of the procedure and the sedation                            options and risks were discussed with the patient.                            All questions were answered, and informed consent                            was obtained. Prior Anticoagulants: The patient has                            taken no previous anticoagulant or antiplatelet                            agents. ASA Grade Assessment: II - A patient with                            mild systemic disease. After reviewing the risks                            and benefits, the patient was deemed in                            satisfactory condition to undergo the procedure.                           After obtaining informed consent, the endoscope was  passed under direct vision. Throughout the                            procedure, the patient's blood pressure, pulse, and                            oxygen saturations were monitored continuously. The                            Endoscope was introduced through the mouth, and                            advanced to the second part of duodenum. The upper                            GI endoscopy was  accomplished without difficulty.                            The patient tolerated the procedure well. Scope In: Scope Out: Findings:                 No endoscopic abnormality was evident in the                            esophagus to explain the patient's complaint of                            dysphagia. It was decided, however, to proceed with                            dilation of the entire esophagus. A guidewire was                            placed and the scope was withdrawn. Dilation was                            performed with a Savary dilator with no resistance                            at 16 mm.                           A few localized small erosions with no bleeding and                            no stigmata of recent bleeding were found in the                            prepyloric region of the stomach. Biopsies were                            taken with a cold forceps for histology.  Diffuse moderate inflammation characterized by                            erythema, friability and granularity was found in                            the gastric fundus and in the gastric body.                            Biopsies were taken with a cold forceps for                            histology.                           The exam of the stomach was otherwise normal.                           The duodenal bulb and second portion of the                            duodenum were normal. Complications:            No immediate complications. Estimated Blood Loss:     Estimated blood loss was minimal. Impression:               - No endoscopic esophageal abnormality to explain                            patient's dysphagia. Esophagus dilated.                           - Erosive gastropathy with no bleeding and no                            stigmata of recent bleeding. Biopsied.                           - Gastritis. Biopsied.                           - Normal  duodenal bulb and second portion of the                            duodenum. Recommendation:           - Patient has a contact number available for                            emergencies. The signs and symptoms of potential                            delayed complications were discussed with the                            patient. Return to normal activities tomorrow.  Written discharge instructions were provided to the                            patient.                           - Clear liquid diet for 2 hours, then advance as                            tolerated to soft diet today.                           - Resume prior diet tomorrow.                           - Antireflux measures long term.                           - Continue present medications.                           - Await pathology results.                           - Protonix (pantoprazole) 40 mg PO BID for 2                            months, then 40 mg po qam for 1 year. Ladene Artist, MD 06/27/2019 11:23:45 AM This report has been signed electronically.

## 2019-06-27 NOTE — Progress Notes (Signed)
Called to room to assist during endoscopic procedure.  Patient ID and intended procedure confirmed with present staff. Received instructions for my participation in the procedure from the performing physician.  

## 2019-06-27 NOTE — Progress Notes (Signed)
Temp check by:JB Vital check by:DT  The medical and surgical history was reviewed and verified with the patient. 

## 2019-06-27 NOTE — Progress Notes (Signed)
PT taken to PACU. Monitors in place. VSS. Report given to RN. 

## 2019-06-27 NOTE — Op Note (Signed)
[facility] Patient Name: Anna Moses Procedure Date: 06/27/2019 10:37 AM MRN: [id number] Endoscopist: Ladene Artist , MD Age: 44 Referring MD:  Date of Birth: 1975-06-17 Gender: Female Account #: [id number] Procedure:                Colonoscopy Indications:              Abnormal CT of the GI tract Medicines:                Monitored Anesthesia Care Procedure:                Pre-Anesthesia Assessment:                           - Prior to the procedure, a History and Physical                            was performed, and patient medications and                            allergies were reviewed. The patient's tolerance of                            previous anesthesia was also reviewed. The risks                            and benefits of the procedure and the sedation                            options and risks were discussed with the patient.                            All questions were answered, and informed consent                            was obtained. Prior Anticoagulants: The patient has                            taken no previous anticoagulant or antiplatelet                            agents. ASA Grade Assessment: II - A patient with                            mild systemic disease. After reviewing the risks                            and benefits, the patient was deemed in                            satisfactory condition to undergo the procedure.                           After obtaining informed consent, the colonoscope  was passed under direct vision. Throughout the                            procedure, the patient's blood pressure, pulse, and                            oxygen saturations were monitored continuously. The                            Colonoscope was introduced through the anus and                            advanced to the the cecum, identified by                            appendiceal orifice and ileocecal valve.  The                            ileocecal valve, appendiceal orifice, and rectum                            were photographed. The quality of the bowel                            preparation was adequate. The colonoscopy was                            performed without difficulty. The patient tolerated                            the procedure well. Scope In: 10:49:45 AM Scope Out: 11:04:35 AM Scope Withdrawal Time: 0 hours 10 minutes 46 seconds  Total Procedure Duration: 0 hours 14 minutes 50 seconds  Findings:                 The perianal and digital rectal examinations were                            normal.                           Four sessile polyps were found in the sigmoid colon                            (1), transverse colon (2) and ascending colon (1).                            The polyps were 5 to 8 mm in size. These polyps                            were removed with a cold snare. Resection and                            retrieval were complete.  The exam was otherwise without abnormality on                            direct and retroflexion views. Complications:            No immediate complications. Estimated blood loss:                            None. Estimated Blood Loss:     Estimated blood loss: none. Impression:               - Four 5 to 8 mm polyps in the sigmoid colon, in                            the transverse colon and in the ascending colon,                            removed with a cold snare. Resected and retrieved.                           - The examination was otherwise normal on direct                            and retroflexion views. Recommendation:           - Repeat colonoscopy after studies are complete for                            surveillance based on pathology results with a more                            extensive bowel prep.                           - Patient has a contact number available for                             emergencies. The signs and symptoms of potential                            delayed complications were discussed with the                            patient. Return to normal activities tomorrow.                            Written discharge instructions were provided to the                            patient.                           - Resume previous diet.                           - Continue present medications.                           -  Await pathology results. Ladene Artist, MD 06/27/2019 11:10:01 AM This report has been signed electronically.

## 2019-06-27 NOTE — Patient Instructions (Signed)
Protonix (pantoprazole) 40 mg by mouth twice daily for 2 months, then 40mg  by mouth every morning for 1 year.  Handouts provided on polyps, gastritis, anti-reflux regimen, and post dilation diet.  YOU HAD AN ENDOSCOPIC PROCEDURE TODAY AT [facility]:   Refer to the procedure report that was given to you for any specific questions about what was found during the examination.  If the procedure report does not answer your questions, please call your gastroenterologist to clarify.  If you requested that your care partner not be given the details of your procedure findings, then the procedure report has been included in a sealed envelope for you to review at your convenience later.  YOU SHOULD EXPECT: Some feelings of bloating in the abdomen. Passage of more gas than usual.  Walking can help get rid of the air that was put into your GI tract during the procedure and reduce the bloating. If you had a lower endoscopy (such as a colonoscopy or flexible sigmoidoscopy) you may notice spotting of blood in your stool or on the toilet paper. If you underwent a bowel prep for your procedure, you may not have a normal bowel movement for a few days.  Please Note:  You might notice some irritation and congestion in your nose or some drainage.  This is from the oxygen used during your procedure.  There is no need for concern and it should clear up in a day or so.  SYMPTOMS TO REPORT IMMEDIATELY:   Following lower endoscopy (colonoscopy or flexible sigmoidoscopy):  Excessive amounts of blood in the stool  Significant tenderness or worsening of abdominal pains  Swelling of the abdomen that is new, acute  Fever of 100F or higher   Following upper endoscopy (EGD)  Vomiting of blood or coffee ground material  New chest pain or pain under the shoulder blades  Painful or persistently difficult swallowing  New shortness of breath  Fever of 100F or higher  Black, tarry-looking stools  For urgent  or emergent issues, a gastroenterologist can be reached at any hour by calling [phone]. Do not use MyChart messaging for urgent concerns.    DIET:  Clear liquids for 2 hours (until 1:30pm), then soft diet for the rest of today (see handout).  Drink plenty of fluids but you should avoid alcoholic beverages for 24 hours.  ACTIVITY:  You should plan to take it easy for the rest of today and you should NOT DRIVE or use heavy machinery until tomorrow (because of the sedation medicines used during the test).    FOLLOW UP: Our staff will call the number listed on your records 48-72 hours following your procedure to check on you and address any questions or concerns that you may have regarding the information given to you following your procedure. If we do not reach you, we will leave a message.  We will attempt to reach you two times.  During this call, we will ask if you have developed any symptoms of COVID 19. If you develop any symptoms (ie: fever, flu-like symptoms, shortness of breath, cough etc.) before then, please call [phone].  If you test positive for Covid 19 in the 2 weeks post procedure, please call and report this information to [place].    If any biopsies were taken you will be contacted by phone or by letter within the next 1-3 weeks.  Please call us at [phone] if you have not heard about the biopsies in 3 weeks.  SIGNATURES/CONFIDENTIALITY: You and/or your care partner have signed paperwork which will be entered into your electronic medical record.  These signatures attest to the fact that that the information above on your After Visit Summary has been reviewed and is understood.  Full responsibility of the confidentiality of this discharge information lies with you and/or your care-partner.

## 2019-06-28 LAB — OVA + PARASITE EXAM

## 2019-06-28 LAB — O&P RESULT

## 2019-07-01 ENCOUNTER — Telehealth: Payer: Self-pay | Admitting: *Deleted

## 2019-07-01 NOTE — Telephone Encounter (Signed)
  Follow up Call-  Call back number 06/27/2019  Post procedure Call Back phone  # [phone]  Permission to leave phone message Yes     Patient questions:  Do you have a fever, pain , or abdominal swelling? No. Pain Score  0 *  Have you tolerated food without any problems? Yes.    Have you been able to return to your normal activities? Yes.    Do you have any questions about your discharge instructions: Diet   No. Medications  No. Follow up visit  No.  Do you have questions or concerns about your Care? No.  Actions: * If pain score is 4 or above: No action needed, pain <4.   1. Have you developed a fever since your procedure? no  2.   Have you had an respiratory symptoms (SOB or cough) since your procedure? no  3.   Have you tested positive for COVID 19 since your procedure no  4.   Have you had any family members/close contacts diagnosed with the COVID 19 since your procedure?  no   If yes to any of these questions please route to Joylene John, RN and Alphonsa Gin, Therapist, sports.

## 2019-07-01 NOTE — Telephone Encounter (Signed)
Follow up call made, no answer, left message 

## 2019-07-08 ENCOUNTER — Other Ambulatory Visit: Payer: Self-pay

## 2019-07-08 MED ORDER — DOXYCYCLINE HYCLATE 100 MG PO CAPS: 100.0000 mg | ORAL_CAPSULE | Freq: Two times a day (BID) | ORAL | 0 refills | Status: AC

## 2019-07-08 MED ORDER — METRONIDAZOLE 250 MG PO TABS: 250.0000 mg | ORAL_TABLET | Freq: Four times a day (QID) | ORAL | 0 refills | Status: AC

## 2019-07-08 MED ORDER — BISMUTH SUBSALICYLATE 262 MG PO CHEW: 524.0000 mg | CHEWABLE_TABLET | Freq: Four times a day (QID) | ORAL | 0 refills | Status: AC

## 2019-07-23 ENCOUNTER — Ambulatory Visit: Payer: Self-pay | Admitting: Licensed Clinical Social Worker

## 2019-07-23 ENCOUNTER — Other Ambulatory Visit: Payer: Self-pay

## 2019-07-23 ENCOUNTER — Encounter: Payer: Self-pay | Admitting: Family Medicine

## 2019-07-23 ENCOUNTER — Ambulatory Visit (INDEPENDENT_AMBULATORY_CARE_PROVIDER_SITE_OTHER): Payer: Medicaid Other | Admitting: Family Medicine

## 2019-07-23 VITALS — BP 108/68 | HR 80 | Ht 61.0 in | Wt 251.8 lb

## 2019-07-23 DIAGNOSIS — R1310 Dysphagia, unspecified: Secondary | ICD-10-CM

## 2019-07-23 DIAGNOSIS — A048 Other specified bacterial intestinal infections: Secondary | ICD-10-CM

## 2019-07-23 DIAGNOSIS — K769 Liver disease, unspecified: Secondary | ICD-10-CM | POA: Diagnosis not present

## 2019-07-23 DIAGNOSIS — F209 Schizophrenia, unspecified: Secondary | ICD-10-CM | POA: Diagnosis present

## 2019-07-23 DIAGNOSIS — J302 Other seasonal allergic rhinitis: Secondary | ICD-10-CM | POA: Diagnosis not present

## 2019-07-23 MED ORDER — PANTOPRAZOLE SODIUM 40 MG PO TBEC: 40.0000 mg | DELAYED_RELEASE_TABLET | Freq: Two times a day (BID) | ORAL | 2 refills | Status: DC

## 2019-07-23 MED ORDER — LORATADINE 10 MG PO TABS: 10.0000 mg | ORAL_TABLET | Freq: Every day | ORAL | 11 refills | Status: DC

## 2019-07-23 NOTE — Chronic Care Management (AMB) (Signed)
   Social Work  Care Management Consultation  07/23/2019 Name: Bilen Gole MRN: [id number] DOB: 11/11/1975  Graylon Good Jacqline Kubota is a 44 y.o. year old female who sees Benay Pike, MD for primary care. LCSW was consulted by PCP for information /resources to assistance patient with reconnecting to psychiatry . Patient was not interviewed or contacted during this encounter however LCSW collaborated with PCP .  Patient denies SI or HI  Recommendation: After consultation with provider it is determined that patient may benefit from contacting psychiatry to restart medications .   OBJECTIVE:   Depression screen PHQ 2/9 07/23/2019  Decreased Interest 1  Down, Depressed, Hopeless 2  PHQ - 2 Score 3    Intervention: Relevant list of providers  discussed and shared with PCP. Provider gave information to patient.   Review of patient status, including review of consultants reports, relevant laboratory and other test results, and collaboration with appropriate care team members and the patient's provider was performed as part of comprehensive patient evaluation and provision of chronic care management services.    Plan: LCSW is available to follow up with the patient if further intervention please place formal CCM referral  Casimer Lanius, [place] / [place]   [phone] 1:39 PM

## 2019-07-23 NOTE — Patient Instructions (Signed)
It was nice to meet you today,  We addressed the following issues today: -I I I prescribed a medicine called Claritin for you for allergies, that you should take instead of your current allergy medicine.  -I would like you to continue taking the Protonix that was prescribed by your gastroenterologist.  I would also like you to call them and clarify with them whether or not you are supposed to still be taking the doxycycline and metronidazole.  -I have given you a list of psychiatric resources and highlighted the 2 I think you should contact first.  Neoma Laming will call you in a few days to see if you have any issues with scheduling these appointments.  I would like to see you back in 3 to 4 weeks to reassess these issues.  Have a great day,  Clemetine Marker, MD

## 2019-07-23 NOTE — Progress Notes (Signed)
SUBJECTIVE:   CHIEF COMPLAINT / HPI: new patient/establish care  Schizophrenia: diagnosed 6 or 7 years ago.  Last took medications two or three years ago.  She was taking depakote,  And two other medications (possibly clozaril?). States she has been Occupational psychologist voices.  She has had thoughts of hurting herself and others, but has no specific plan. She says the last time she had suicidal thoguhts was in February. She also states she has Depressed thoguhts, and feels lonely. She lives alone and doesn't get out of the house much. she Martin Majestic to a therapist when she was in [place] but has not established with a therapist or psychiatrist here.  No family history of schizophrenia that she is aware of.   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 These were biopsied and showed inflammation with eosinophilia. She has been seeing an oncologist for this but states they do not think this is malignancy but will continue to monitor.  Father died of liver disease and her brother had biliary atresia as a child.   Dysphagia: recent dilation. Swallowing has been Better since the dilation. No issues curerntly.    H. Pylori: pt was prescribed quad therapy for H. Pylori apporximately two weeks ago. She states she was told to keep taking metronidazole and doxycycline.  Not taking the protonix currently.  Has epigastric pain. The pain is Worse before she eats.  Also endorses Burning in throat and reflux.    Allergies: Ears, eyes, nose itching and sneezing.  Taking diphenydramine.   PMH: schizophrenia, seasonal allergies  PSH:  two c/sections.   FH: mom, brother, sister has psych history, but she doesn't know the details.  Father died at 38 recently d/t liver cancer. Not an every day drinker. Brother had liver transplant when he was 3 for biliary atresia.;    SH: lives alone. No job.  Smokes 1/2 ppd.  No alcohol use.  Smokes marijuana daily. Not sexually active.     Allergies: no medication allergies.      OBJECTIVE:   BP 108/68   Pulse 80   Ht 5\' 1"  (1.549 m)   Wt 114.2 kg   LMP 06/14/2019 (Approximate)   SpO2 97%   BMI 47.58 kg/m   General: alert and oriented. No acute distress.  HEENT: NCAT. Moist oral mucosa.  CV: RRR. No murmurs. 2+ radial pulse b/l Pulm: LCTAB. No wheezes or crackles.  Abd: obese pannus.  No tenderness.  Normal bowel sounds.  MSK: equal strength upper and lower extremities b/l.   Neuro: CN 2-12 grossly intact.  Sensation intact.  Reflexes equal bilaterally Psych: good eye contract.  Speech is occasionally delayed, moreso when talking about her schizophrenia symptoms.  Does not appear distracted.  Affect restricted.    ASSESSMENT/PLAN:   Schizophrenia ([place]) Reports being diagnosed 6-7 years ago and on 3 medications (of which can only remember depakote), but not taking anything for past 2-3 years.  Endorses hx of SI, HI, and auditory hallucinations. Not endorsing SI since February. May have some underlying depression (possibly schizoaffective disorder).  I have given the patient a list of psychiatric resources she can contact to establish care with. I have also put in a referral to Casimer Lanius for chronic care management to follow up with her this week to make sure an appointment has been made.   H. pylori infection Pt was prescribed quad therapy with doxy, flagyl, protonix, and bismuth.  She is on the last day of her  14 day course but has been taking some leftover doxy/flagyl and has several left over.  She states she was told to take an additional course of the abx but not the protonix.  I informed her this is contradictory to what I had read in her chart and told her to call her GI doctor to confirm which medications she is supposed to be taking.  I refilled her protonix as she stated she was out and her notes say she is to continue on them for several more months.    Liver lesion Found on CT scan. Biopsy indicated inflammatory process with eosinophilia.  Pt  states her oncologist is going to continue working it up but believes it is not malignant.   Dysphagia Symptoms much improved after dilation.   Seasonal allergies Endorses seasonal rhinitis, itching of eyes, nose, and ears.  Has been taking diphenhydramine.  -prescribed claritin.  - d/c diphenydramine     Benay Pike, MD [place]

## 2019-07-24 DIAGNOSIS — A048 Other specified bacterial intestinal infections: Secondary | ICD-10-CM | POA: Insufficient documentation

## 2019-07-24 DIAGNOSIS — F209 Schizophrenia, unspecified: Secondary | ICD-10-CM | POA: Insufficient documentation

## 2019-07-24 DIAGNOSIS — J302 Other seasonal allergic rhinitis: Secondary | ICD-10-CM | POA: Insufficient documentation

## 2019-07-24 NOTE — Assessment & Plan Note (Signed)
Symptoms much improved after dilation.

## 2019-07-24 NOTE — Assessment & Plan Note (Signed)
Endorses seasonal rhinitis, itching of eyes, nose, and ears.  Has been taking diphenhydramine.  -prescribed claritin.  - d/c diphenydramine

## 2019-07-24 NOTE — Assessment & Plan Note (Signed)
Pt was prescribed quad therapy with doxy, flagyl, protonix, and bismuth.  She is on the last day of her 14 day course but has been taking some leftover doxy/flagyl and has several left over.  She states she was told to take an additional course of the abx but not the protonix.  I informed her this is contradictory to what I had read in her chart and told her to call her GI doctor to confirm which medications she is supposed to be taking.  I refilled her protonix as she stated she was out and her notes say she is to continue on them for several more months.

## 2019-07-24 NOTE — Assessment & Plan Note (Signed)
Found on CT scan. Biopsy indicated inflammatory process with eosinophilia.  Pt states her oncologist is going to continue working it up but believes it is not malignant.

## 2019-07-24 NOTE — Assessment & Plan Note (Signed)
Reports being diagnosed 6-7 years ago and on 3 medications (of which can only remember depakote), but not taking anything for past 2-3 years.  Endorses hx of SI, HI, and auditory hallucinations. Not endorsing SI since February. May have some underlying depression (possibly schizoaffective disorder).  I have given the patient a list of psychiatric resources she can contact to establish care with. I have also put in a referral to Casimer Lanius for chronic care management to follow up with her this week to make sure an appointment has been made.

## 2019-07-29 ENCOUNTER — Encounter (HOSPITAL_COMMUNITY): Payer: Self-pay

## 2019-07-29 ENCOUNTER — Other Ambulatory Visit: Payer: Self-pay

## 2019-07-29 ENCOUNTER — Ambulatory Visit (HOSPITAL_COMMUNITY)
Admission: RE | Admit: 2019-07-29 | Discharge: 2019-07-29 | Disposition: A | Discharge status: Home / Self Care | Payer: Medicaid Other | Source: Ambulatory Visit | Attending: Hematology and Oncology | Admitting: Hematology and Oncology

## 2019-07-29 DIAGNOSIS — K769 Liver disease, unspecified: Secondary | ICD-10-CM | POA: Insufficient documentation

## 2019-07-29 MED ORDER — IOHEXOL 300 MG/ML  SOLN
100.0000 mL | Freq: Once | INTRAMUSCULAR | Status: AC | PRN
  Administered 2019-07-29: 100 mL via INTRAVENOUS

## 2019-07-29 MED ORDER — SODIUM CHLORIDE (PF) 0.9 % IJ SOLN
INTRAMUSCULAR | Status: AC
  Filled 2019-07-29: qty 50

## 2019-07-30 ENCOUNTER — Ambulatory Visit: Payer: Medicaid Other | Admitting: Licensed Clinical Social Worker

## 2019-07-30 DIAGNOSIS — F209 Schizophrenia, unspecified: Secondary | ICD-10-CM

## 2019-07-30 NOTE — Chronic Care Management (AMB) (Signed)
Care Management   Clinical Social Work initial Note  07/30/2019 Name: Anna Moses MRN: [id number] DOB: 1975-09-28  The Care Management team was consulted by PCP to assist the patient with Mental Health Counseling and Resources and connecting to psychiatry for medication management.   Anna Moses is a 44 y.o. year old female who sees Benay Pike, MD for primary care. LCSW reached out to [facility] today by phone to introduce self, assess needs and barriers to care.   Assessment: Patient is pleasant and engaged in conversation.  Patient has been off of her medication for several years and states she needs to start them again as soon as possible.  Patient has not had a mental health provider in [place].  She reviewed the list provided by PCP but did not know where to start.  Patient states she does not like being around a lot of people so does not want to go agencies that have a lot of people.  Per patient her mother can accompany her to the appointment if needed.  She also has support from daughter for transportation needs.      Review of patient status, including review of consultants reports, relevant laboratory and other test results, and collaboration with appropriate care team members and the patient's provider was performed as part of comprehensive patient evaluation and provision of chronic care management services.    Advance Directive Status: Not addressed during this encounter SDOH (Social Determinants of Health) assessments performed: Yes:  SDOH Interventions     Most Recent Value  SDOH Interventions  SDOH Interventions for the Following Domains  Depression  Depression Interventions/Treatment   Referral to Psychiatry      Goals Addressed            This Visit's Progress   . psychatry and counseling resources       CARE PLAN ENTRY (see longitudinal plan of care for additional care plan information)  Current Barriers:  . Patient with Schizophrenia  acknowledges deficits with connecting to mental health provider for counseling and medication management.  . Patient needs Support, Education, and Care Coordination in order to meet unmet mental health needs Clinical Social Work Goal(s):  Marland Kitchen Over the next 30 days, patient will work with connect with psychiatry for medication management and ongoing counseling.  Interventions:  . Assessed patient's understanding, education, previous treatment and care coordination needs  . Collaborated with appropriate clinical care team members regarding patient needs . Discussed several options for mental health treatment based on need and insurance. Assisted patient with narrowing the options down to [facility] ) . Assisted patient with making phone call to schedule visit with psychiatry.  [facility]  (Psychiatry only; [place], [place], [place] [address] [phone] . Appointment with [facility] April 23rd at 1:30, after patient is connected with Dr. Altamese Bolt he will refer her to a counselor that he works closely with Patient Self Care Activities & Deficits:  . Patient is unable to independently navigate community resource options without care coordination support . Patient is able to keep appointment scheduled today and is motivated for treatment  Initial goal documentation        Outpatient Encounter Medications as of 07/30/2019  Medication Sig  . loratadine (CLARITIN) 10 MG tablet Take 1 tablet (10 mg total) by mouth daily.  . pantoprazole (PROTONIX) 40 MG tablet Take 1 tablet (40 mg total) by mouth daily.  . pantoprazole (PROTONIX) 40 MG tablet  Take 1 tablet (40 mg total) by mouth 2 (two) times daily.  . [DISCONTINUED] diphenhydrAMINE (BENADRYL) 25 MG tablet Take 1 tablet (25 mg total) by mouth every 6 (six) hours. (Patient not taking: Reported on 12/12/2018)  . [DISCONTINUED] famotidine (PEPCID) 20 MG tablet Take 1 tablet (20 mg total) by mouth 2 (two) times daily.  . [DISCONTINUED]  Ibuprofen-diphenhydrAMINE Cit (ADVIL PM) 200-38 MG TABS Take 2 tablets by mouth 2 (two) times daily as needed (headaches).  . [DISCONTINUED] metoCLOPramide (REGLAN) 10 MG tablet Take 1 tablet (10 mg total) by mouth every 6 (six) hours as needed for nausea (nausea/headache).   No facility-administered encounter medications on file as of 07/30/2019.       Information about Care Management services was shared with Ms.  Heidelberger today including:  1. Care Management services include personalized support from designated clinical staff supervised by her physician, including individualized plan of care and coordination with other care providers 2. Remind patient of 24/7 contact phone numbers to provider's office for assistance with urgent and routine care needs. 3. Care Management services are voluntary and patient may stop at any time .  Patient agreed to services provided today and verbal consent obtained.    Follow Up Plan:  LCSW will F/U with patient after her initial appointment with Dr. Altamese Seaforth at [place], [place] / [place]   [phone] 1:32 PM

## 2019-07-30 NOTE — Patient Instructions (Signed)
Licensed Clinical Social Worker Visit Information Anna Moses  it was nice speaking with you. Please call me directly if you have questions [phone] Goals we discussed today: getting you connect to psychiatry and counseling Goals Addressed            This Visit's Progress   . psychatry and counseling resources       CARE PLAN ENTRY (see longitudinal plan of care for additional care plan information)  Current Barriers:  . Patient with Schizophrenia acknowledges deficits with connecting to mental health provider for counseling and medication management.  . Patient needs Support, Education, and Care Coordination in order to meet unmet mental health needs Clinical Social Work Goal(s):  Marland Kitchen Over the next 30 days, patient will work with connect with psychiatry for medication management and ongoing counseling.  Interventions:  . Assessed patient's understanding, education, previous treatment and care coordination needs  . Collaborated with appropriate clinical care team members regarding patient needs . Discussed several options for mental health treatment based on need and insurance. Assisted patient with narrowing the options down to [facility] ) . Assisted patient with making phone call to schedule visit with psychiatry.  [place]  (Psychiatry only; [place], [place], [place] [address] [phone] . Appointment with [facility] April 23rd at 1:30, after patient is connected with Dr. Altamese [place] he will refer her to a counselor that he works closely with Patient Self Care Activities & Deficits:  . Patient is unable to independently navigate community resource options without care coordination support . Patient is able to keep appointment scheduled today and is motivated for treatment  Initial goal documentation      Materials provided:  [name] received Care Management services today:  1. Care Management services include personalized support from designated clinical  staff supervised by her physician, including individualized plan of care and coordination with other care providers 2. 24/7 contact [phone] for assistance for urgent and routine care needs. 3. Care Management are voluntary services and be declined at any time by calling the office.  Patient  verbally agreed to assistance and services provided by embedded care coordination/care management team today.   Patient verbalizes understanding of instructions provided today.  Follow up plan:  SW will follow up with patient by phone over the next week  Maurine Cane, LCSW

## 2019-08-05 ENCOUNTER — Telehealth: Payer: Self-pay | Admitting: Hematology and Oncology

## 2019-08-05 DIAGNOSIS — K769 Liver disease, unspecified: Secondary | ICD-10-CM

## 2019-08-05 NOTE — Telephone Encounter (Signed)
Called Ms. Piccione to discuss the results of her CT scan.  Fortunately that time it appears that the lesions on her liver are markedly decreasing in size.  Patient previously had a biopsy which showed benign findings.  Given this I would recommend a repeat CT scan in approximately 3 months time in order to assure that these are continuing to decrease in size and hopefully will dissipated.  I discussed these findings with the patient.  She currently scheduled for an appointment on 08/08/2019, but given the stable CT scan findings and her reports of no new symptoms I would recommend that we cancel this appointment and reschedule it for 3 months from now with her next CT scan.  The patient voiced her understanding of this plan.  Ledell Peoples, MD Department of Hematology/Oncology [place] at [facility] Phone: [phone] Pager: [phone] Email: [email or web address]

## 2019-08-05 NOTE — Telephone Encounter (Signed)
R/s 4/29 appt per MD request. Called and left msg. Mailed printout

## 2019-08-07 ENCOUNTER — Ambulatory Visit: Payer: Medicaid Other | Admitting: Licensed Clinical Social Worker

## 2019-08-07 ENCOUNTER — Other Ambulatory Visit: Payer: Self-pay

## 2019-08-07 DIAGNOSIS — F209 Schizophrenia, unspecified: Secondary | ICD-10-CM

## 2019-08-07 DIAGNOSIS — Z7189 Other specified counseling: Secondary | ICD-10-CM

## 2019-08-07 NOTE — Chronic Care Management (AMB) (Signed)
Care Management   Clinical Social Work Follow Up   08/07/2019 Name: Anna Moses MRN: [id number] DOB: 09-20-1975  Referred by: Benay Pike, MD  Reason for referral : Care Coordination (connect with psychiatry)  Anna Moses is a 44 y.o. year old female who is a primary care patient of Jeannine Kitten Garen Lah, MD.  Reason for follow-up: Phone encounter with patient today for ongoing assessment and brief interventions to assist with care coordination needs.   Assessment: Patient is making progress and completed goal of connecting for medication management.   Review of patient status, including review of consultants reports, relevant laboratory and other test results, and collaboration with appropriate care team members and the patient's provider was performed as part of comprehensive patient evaluation and provision of care management services.    SDOH (Social Determinants of Health) assessments performed: No   Goals Addressed            This Visit's Progress   . COMPLETED: psychiatry and counseling resources       CARE PLAN ENTRY (see longitudinal plan of care for additional care plan information)  Current Barriers & Progress:  . Patient with Schizophrenia acknowledges deficits with connecting to mental health provider for counseling and medication management.  . Patient needs Support, Education, and Care Coordination in order to meet unmet mental health needs . Patient kept appointment with [facility], Rx called in for medication/ Patient has F/U with [facility] in 2 weeks . Patient states out of town and did not pick up medication will pick up tomorrow and start taking Clinical Social Work Goal(s):  Marland Kitchen Over the next 30 days, patient will work with connect with psychiatry for medication management and ongoing counseling.  Interventions:  . Assessed patient's understanding, education and care coordination needs  . Collaborated with appropriate clinical care team members  regarding patient needs . Discussed update on appointment at [place] and medication compliance . patient is now followed by psychiatry.  [place]  (Psychiatry only; [place], [place],  [address] [phone]. They will connect her to a counselor. Patient Self Care Activities & Deficits:  . Patient is unable to independently navigate community resource options without care coordination support . Patient is able to keep appointment scheduled with assistance of her mother and is motivated for treatment  Please see past updates related to this goal by clicking on the "Past Updates" button in the selected goal        Outpatient Encounter Medications as of 08/07/2019  Medication Sig  . loratadine (CLARITIN) 10 MG tablet Take 1 tablet (10 mg total) by mouth daily.  . pantoprazole (PROTONIX) 40 MG tablet Take 1 tablet (40 mg total) by mouth daily.  . pantoprazole (PROTONIX) 40 MG tablet Take 1 tablet (40 mg total) by mouth 2 (two) times daily.  . [DISCONTINUED] diphenhydrAMINE (BENADRYL) 25 MG tablet Take 1 tablet (25 mg total) by mouth every 6 (six) hours. (Patient not taking: Reported on 12/12/2018)  . [DISCONTINUED] famotidine (PEPCID) 20 MG tablet Take 1 tablet (20 mg total) by mouth 2 (two) times daily.  . [DISCONTINUED] Ibuprofen-diphenhydrAMINE Cit (ADVIL PM) 200-38 MG TABS Take 2 tablets by mouth 2 (two) times daily as needed (headaches).  . [DISCONTINUED] metoCLOPramide (REGLAN) 10 MG tablet Take 1 tablet (10 mg total) by mouth every 6 (six) hours as needed for nausea (nausea/headache).   No facility-administered encounter medications on file as of 08/07/2019.   Plan:  1.  No additional services need from LCSW at this time 2. Patient will call office if needed  Casimer Lanius, [place] [facility] [employer]   [phone] 11:09 AM

## 2019-08-07 NOTE — Patient Instructions (Signed)
Licensed Clinical Social Worker Visit Information Ms. Schullo  it was nice speaking with you. Please call me directly if you have questions [phone] Goals we discussed today: connecting with psychiatry Goals Addressed            This Visit's Progress   . COMPLETED: psychiatry and counseling resources       CARE PLAN ENTRY (see longitudinal plan of care for additional care plan information)  Current Barriers & Progress:  . Patient with Schizophrenia acknowledges deficits with connecting to mental health provider for counseling and medication management.  . Patient needs Support, Education, and Care Coordination in order to meet unmet mental health needs . Patient kept appointment with [facility], Rx called in for medication/ Patient has F/U with [facility] in 2 weeks . Patient states out of town and did not pick up medication will pick up tomorrow and start taking Clinical Social Work Goal(s):  Marland Kitchen Over the next 30 days, patient will work with connect with psychiatry for medication management and ongoing counseling.  Interventions:  . Assessed patient's understanding, education and care coordination needs  . Collaborated with appropriate clinical care team members regarding patient needs . Discussed update on appointment at [place] and medication compliance . patient is now followed by psychiatry.  [place]  (Psychiatry only; [place], [place], [place] [address] [phone]. They will connect her to a counselor. Patient Self Care Activities & Deficits:  . Patient is unable to independently navigate community resource options without care coordination support . Patient is able to keep appointment scheduled with assistance of her mother and is motivated for treatment  Please see past updates related to this goal by clicking on the "Past Updates" button in the selected goal       Materials provided:  Ms. Nix received Care Management services today:   1. Care Management services include personalized support from designated clinical staff supervised by her physician, including individualized plan of care and coordination with other care providers 2. 24/7 contact [phone] for assistance for urgent and routine care needs. 3. Care Management are voluntary services and be declined at any time by calling the office.  Patient verbalizes understanding of instructions provided today.  Follow up plan: No follow up scheduled   Maurine Cane, LCSW

## 2019-08-08 ENCOUNTER — Ambulatory Visit: Payer: Medicaid Other | Admitting: Hematology and Oncology

## 2019-08-08 ENCOUNTER — Other Ambulatory Visit: Payer: Medicaid Other

## 2019-08-20 ENCOUNTER — Encounter: Payer: Self-pay | Admitting: Family Medicine

## 2019-08-20 ENCOUNTER — Ambulatory Visit (INDEPENDENT_AMBULATORY_CARE_PROVIDER_SITE_OTHER): Payer: Medicaid Other | Admitting: Family Medicine

## 2019-08-20 ENCOUNTER — Other Ambulatory Visit: Payer: Medicaid Other

## 2019-08-20 ENCOUNTER — Other Ambulatory Visit: Payer: Self-pay

## 2019-08-20 VITALS — BP 112/88 | HR 74 | Ht 61.0 in | Wt 241.0 lb

## 2019-08-20 DIAGNOSIS — F209 Schizophrenia, unspecified: Secondary | ICD-10-CM

## 2019-08-20 DIAGNOSIS — J302 Other seasonal allergic rhinitis: Secondary | ICD-10-CM | POA: Diagnosis not present

## 2019-08-20 DIAGNOSIS — K769 Liver disease, unspecified: Secondary | ICD-10-CM

## 2019-08-20 DIAGNOSIS — Z6841 Body Mass Index (BMI) 40.0 and over, adult: Secondary | ICD-10-CM | POA: Diagnosis not present

## 2019-08-20 LAB — POCT GLYCOSYLATED HEMOGLOBIN (HGB A1C): Hemoglobin A1C: 5.4 % (ref 4.0–5.6)

## 2019-08-20 MED ORDER — LORATADINE 10 MG PO TBDP: 10.0000 mg | ORAL_TABLET | Freq: Every day | ORAL | 12 refills | Status: DC

## 2019-08-20 MED ORDER — CHLORHEXIDINE GLUCONATE 4 % EX LIQD: CUTANEOUS | 0 refills | Status: DC

## 2019-08-20 NOTE — Assessment & Plan Note (Signed)
Continue current medication regimen and follow up with Dr. Altamese [place] on June 1st. Keep an eye out for any unwanted side effects. Call back if symptoms worsen

## 2019-08-20 NOTE — Progress Notes (Addendum)
    SUBJECTIVE:   CHIEF COMPLAINT / HPI:   Schizophrenia: She has been seeing Dr. Altamese [place] for therapy and he started her on risperidone and zoloft 1 week ago. Notes that the sessions have been helpful and the medications have started to improve her symptoms. She does still hear voices but the frequency of this has decreased with the medication. Additionally she has tried to go to [place]  by herself and walked around the shopping center which she handled alright though she still endorses fear of crowded areas. Overall she feels good on these medications and doses. She was on this regimen previously in [place] and states that it had worked well for her. No side effects of note. Next appointment with Dr. Altamese [place] June 1st.   Allergies: Did not receive her Claritin prescription from the pharmacy. Still taking dyphenhydramine to help with her allergies. Continued symptoms - itchy eyes, ears, nose and sneezing.   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 Daughter has this too. She uses Carl[place] Adam on her groin and does not shave or use anything on her armpits. States that salt pork help her abscesses draw to a head and drain pus. Has not noticed any sinus tracks. Most recent lesion started 3 days ago. She does smoke.    PERTINENT  PMH / PSH: Schizophrenia, seasonal allergies  OBJECTIVE:   BP 112/88   Pulse 74   Ht 5\' 1"  (1.549 m)   Wt 241 lb (109.3 kg)   LMP 08/04/2019 (Exact Date)   SpO2 100%   BMI 45.54 kg/m   Physical Exam  Constitutional: She is well-developed, well-nourished, and in no distress.  Skin: Skin is warm.  Early forming abscess, with head under L armpit. Mild erythema surrounding abscess, painful to palpation. No drainage     ASSESSMENT/PLAN:  Health Maintenance  - pap smear -  Schedule next appointment  Schizophrenia [facility]) Continue current medication regimen and follow up with Dr. Altamese [place] on June  1st. Keep an eye out for any unwanted side effects. Call back if symptoms worsen  Seasonal allergies Sent new prescription of Claritin to her pharmacy. She can pick up OTC if she would like.   Hydradenitis Prescribing Chlorhexidine for active lesions. Use once daily for one week then once weekly. Continue warm compresses. She was not interested in surgery or intralesional corticosteroids at this time.  [address]   Resident Addendum I have separately seen and examined the patient.  I have discussed the findings and exam with the student and agree with the above note.  I helped develop the management plan that is described in the student's note and I agree with the content.    Addison Naegeli, MD PGY-2 [facility] residency program

## 2019-08-20 NOTE — Progress Notes (Deleted)
    SUBJECTIVE:   CHIEF COMPLAINT / HPI:   Schizophrenia:  [facility].   Seasonal allergies: claritin?  Pap smear  PERTINENT  PMH / PSH: ***  OBJECTIVE:   There were no vitals taken for this visit.  ***  ASSESSMENT/PLAN:   No problem-specific Assessment & Plan notes found for this encounter.     Benay Pike, MD [place]

## 2019-08-20 NOTE — Assessment & Plan Note (Signed)
Sent new prescription of Claritin to her pharmacy. She can pick up OTC if she would like.

## 2019-08-20 NOTE — Patient Instructions (Addendum)
It was great seeing you today, thank for coming in!  Today we talked about: Schizophrenia - symptoms improving and medications seem to be working well. Please keep a look out for any unwanted symptoms   Will resend Claritin prescription to pharmacy. You can pick this up over the counter as well if you would like.   hidradenitis  - discussed treatment options today. We will proceed with warm compresses for now and hibiclens liquid.  Use this once a day for one week and then once a week thereafter.    I have ordered some labwork for you.  I will call you with the results.   I would like to see you back in three months.    Have a great day.    Clemetine Marker, MD   Hidradenitis Suppurativa Hidradenitis suppurativa is a long-term (chronic) skin disease. It is similar to a severe form of acne, but it affects areas of the body where acne would be unusual, especially areas of the body where skin rubs against skin and becomes moist. These include:  Underarms.  Groin.  Genital area.  Buttocks.  Upper thighs.  Breasts. Hidradenitis suppurativa may start out as small lumps or pimples caused by blocked sweat glands or hair follicles. Pimples may develop into deep sores that break open (rupture) and drain pus. Over time, affected areas of skin may thicken and become scarred. This condition is rare and does not spread from person to person (non-contagious). What are the causes? The exact cause of this condition is not known. It may be related to:  Female and female hormones.  An overactive disease-fighting system (immune system). The immune system may over-react to blocked hair follicles or sweat glands and cause swelling and pus-filled sores. What increases the risk? You are more likely to develop this condition if you:  Are female.  Are 60-58 years old.  Have a family history of hidradenitis suppurativa.  Have a personal history of acne.  Are overweight.  Smoke.  Take the medicine  lithium. What are the signs or symptoms? The first symptoms are usually painful bumps in the skin, similar to pimples. The condition may get worse over time (progress), or it may only cause mild symptoms. If the disease progresses, symptoms may include:  Skin bumps getting bigger and growing deeper into the skin.  Bumps rupturing and draining pus.  Itchy, infected skin.  Skin getting thicker and scarred.  Tunnels under the skin (fistulas) where pus drains from a bump.  Pain during daily activities, such as pain during walking if your groin area is affected.  Emotional problems, such as stress or depression. This condition may affect your appearance and your ability or willingness to wear certain clothes or do certain activities. How is this diagnosed? This condition is diagnosed by a health care provider who specializes in skin diseases (dermatologist). You may be diagnosed based on:  Your symptoms and medical history.  A physical exam.  Testing a pus sample for infection.  Blood tests. How is this treated? Your treatment will depend on how severe your symptoms are. The same treatment will not work for everybody with this condition. You may need to try several treatments to find what works best for you. Treatment may include:  Cleaning and bandaging (dressing) your wounds as needed.  Lifestyle changes, such as new skin care routines.  Taking medicines, such as: ? Antibiotics. ? Acne medicines. ? Medicines to reduce the activity of the immune system. ? A diabetes medicine (  metformin). ? Birth control pills, for women. ? Steroids to reduce swelling and pain.  Working with a mental health care provider, if you experience emotional distress due to this condition. If you have severe symptoms that do not get better with medicine, you may need surgery. Surgery may involve:  Using a laser to clear the skin and remove hair follicles.  Opening and draining deep sores.  Removing  the areas of skin that are diseased and scarred. Follow these instructions at home: Medicines   Take over-the-counter and prescription medicines only as told by your health care provider.  If you were prescribed an antibiotic medicine, take it as told by your health care provider. Do not stop taking the antibiotic even if your condition improves. Skin care  If you have open wounds, cover them with a clean dressing as told by your health care provider. Keep wounds clean by washing them gently with soap and water when you bathe.  Do not shave the areas where you get hidradenitis suppurativa.  Do not wear deodorant.  Wear loose-fitting clothes.  Try to avoid getting overheated or sweaty. If you get sweaty or wet, change into clean, dry clothes as soon as you can.  To help relieve pain and itchiness, cover sore areas with a warm, clean washcloth (warm compress) for 5-10 minutes as often as needed.  If told by your health care provider, take a bleach bath twice a week: ? Fill your bathtub halfway with water. ? Pour in  cup of unscented household bleach. ? Soak in the tub for 5-10 minutes. ? Only soak from the neck down. Avoid water on your face and hair. ? Shower to rinse off the bleach from your skin. General instructions  Learn as much as you can about your disease so that you have an active role in your treatment. Work closely with your health care provider to find treatments that work for you.  If you are overweight, work with your health care provider to lose weight as recommended.  Do not use any products that contain nicotine or tobacco, such as cigarettes and e-cigarettes. If you need help quitting, ask your health care provider.  If you struggle with living with this condition, talk with your health care provider or work with a mental health care provider as recommended.  Keep all follow-up visits as told by your health care provider. This is important. Where to find more  information  Hidradenitis [place].: https://www.hs-foundation.org/ Contact a health care provider if you have:  A flare-up of hidradenitis suppurativa.  A fever or chills.  Trouble controlling your symptoms at home.  Trouble doing your daily activities because of your symptoms.  Trouble dealing with emotional problems related to your condition. Summary  Hidradenitis suppurativa is a long-term (chronic) skin disease. It is similar to a severe form of acne, but it affects areas of the body where acne would be unusual.  The first symptoms are usually painful bumps in the skin, similar to pimples. The condition may get worse over time (progress), or it may only cause mild symptoms.  If you have open wounds, cover them with a clean dressing as told by your health care provider. Keep wounds clean by washing them gently with soap and water when you bathe.  Besides skin care, treatment may include medicines, laser treatment, and surgery. This information is not intended to replace advice given to you by your health care provider. Make sure you discuss any questions you have  with your health care provider. Document Revised: 04/05/2017 Document Reviewed: 04/05/2017 Elsevier Patient Education  2020 [employer].

## 2019-08-21 LAB — HIV ANTIBODY (ROUTINE TESTING W REFLEX): HIV Screen 4th Generation wRfx: NONREACTIVE

## 2019-08-21 LAB — HEPATITIS C ANTIBODY: Hep C Virus Ab: <0.1 s/co ratio (ref 0.0–0.9)

## 2019-08-22 ENCOUNTER — Encounter: Payer: Self-pay | Admitting: Family Medicine

## 2019-09-01 ENCOUNTER — Encounter (HOSPITAL_COMMUNITY): Payer: Self-pay | Admitting: Emergency Medicine

## 2019-09-01 ENCOUNTER — Emergency Department (HOSPITAL_COMMUNITY)
Admission: EM | Admit: 2019-09-01 | Discharge: 2019-09-01 | Disposition: A | Discharge status: Home / Self Care | Payer: Medicaid Other | Attending: Emergency Medicine | Admitting: Emergency Medicine

## 2019-09-01 ENCOUNTER — Other Ambulatory Visit: Payer: Self-pay

## 2019-09-01 ENCOUNTER — Emergency Department (HOSPITAL_COMMUNITY): Payer: Medicaid Other

## 2019-09-01 DIAGNOSIS — Z79899 Other long term (current) drug therapy: Secondary | ICD-10-CM | POA: Insufficient documentation

## 2019-09-01 DIAGNOSIS — R1084 Generalized abdominal pain: Secondary | ICD-10-CM

## 2019-09-01 DIAGNOSIS — K7689 Other specified diseases of liver: Secondary | ICD-10-CM | POA: Insufficient documentation

## 2019-09-01 DIAGNOSIS — F17210 Nicotine dependence, cigarettes, uncomplicated: Secondary | ICD-10-CM | POA: Diagnosis not present

## 2019-09-01 DIAGNOSIS — J45909 Unspecified asthma, uncomplicated: Secondary | ICD-10-CM | POA: Diagnosis not present

## 2019-09-01 DIAGNOSIS — K529 Noninfective gastroenteritis and colitis, unspecified: Secondary | ICD-10-CM | POA: Diagnosis not present

## 2019-09-01 DIAGNOSIS — R1013 Epigastric pain: Secondary | ICD-10-CM | POA: Diagnosis present

## 2019-09-01 LAB — URINALYSIS, ROUTINE W REFLEX MICROSCOPIC
Bacteria, UA: NONE SEEN
Bilirubin Urine: NEGATIVE
Glucose, UA: NEGATIVE mg/dL
Ketones, ur: 80 mg/dL — AB
Leukocytes,Ua: NEGATIVE
Nitrite: NEGATIVE
Protein, ur: 100 mg/dL — AB
Specific Gravity, Urine: 1.03 (ref 1.005–1.030)
pH: 5 (ref 5.0–8.0)

## 2019-09-01 LAB — I-STAT BETA HCG BLOOD, ED (MC, WL, AP ONLY): I-stat hCG, quantitative: <5 m[IU]/mL (ref <5)

## 2019-09-01 LAB — CBC
HCT: 39.2 % (ref 36.0–46.0)
Hemoglobin: 12.1 g/dL (ref 12.0–15.0)
MCH: 26.7 pg (ref 26.0–34.0)
MCHC: 30.9 g/dL (ref 30.0–36.0)
MCV: 86.5 fL (ref 80.0–100.0)
Platelets: 479 10*3/uL — ABNORMAL HIGH (ref 150–400)
RBC: 4.53 MIL/uL (ref 3.87–5.11)
RDW: 15.9 % — ABNORMAL HIGH (ref 11.5–15.5)
WBC: 6.8 10*3/uL (ref 4.0–10.5)
nRBC: 0 % (ref 0.0–0.2)

## 2019-09-01 LAB — COMPREHENSIVE METABOLIC PANEL
ALT: 13 U/L (ref 0–44)
AST: 15 U/L (ref 15–41)
Albumin: 4.1 g/dL (ref 3.5–5.0)
Alkaline Phosphatase: 53 U/L (ref 38–126)
Anion gap: 10 (ref 5–15)
BUN: 11 mg/dL (ref 6–20)
CO2: 19 mmol/L — ABNORMAL LOW (ref 22–32)
Calcium: 9.3 mg/dL (ref 8.9–10.3)
Chloride: 112 mmol/L — ABNORMAL HIGH (ref 98–111)
Creatinine, Ser: 0.84 mg/dL (ref 0.44–1.00)
GFR calc Af Amer: >60 mL/min (ref >60)
GFR calc non Af Amer: >60 mL/min (ref >60)
Glucose, Bld: 167 mg/dL — ABNORMAL HIGH (ref 70–99)
Potassium: 3.6 mmol/L (ref 3.5–5.1)
Sodium: 141 mmol/L (ref 135–145)
Total Bilirubin: 0.3 mg/dL (ref 0.3–1.2)
Total Protein: 7.7 g/dL (ref 6.5–8.1)

## 2019-09-01 LAB — LIPASE, BLOOD: Lipase: 22 U/L (ref 11–51)

## 2019-09-01 MED ORDER — MORPHINE SULFATE (PF) 4 MG/ML IV SOLN
4.0000 mg | Freq: Once | INTRAVENOUS | Status: AC
  Administered 2019-09-01: 4 mg via INTRAVENOUS
  Filled 2019-09-01: qty 1

## 2019-09-01 MED ORDER — ONDANSETRON HCL 4 MG/2ML IJ SOLN
4.0000 mg | Freq: Once | INTRAMUSCULAR | Status: AC
  Administered 2019-09-01: 4 mg via INTRAVENOUS
  Filled 2019-09-01: qty 2

## 2019-09-01 MED ORDER — SODIUM CHLORIDE 0.9% FLUSH
3.0000 mL | Freq: Once | INTRAVENOUS | Status: AC
  Administered 2019-09-01: 3 mL via INTRAVENOUS

## 2019-09-01 MED ORDER — SODIUM CHLORIDE 0.9 % IV BOLUS
1000.0000 mL | Freq: Once | INTRAVENOUS | Status: AC
  Administered 2019-09-01: 1000 mL via INTRAVENOUS

## 2019-09-01 MED ORDER — ONDANSETRON 4 MG PO TBDP
4.0000 mg | ORAL_TABLET | Freq: Three times a day (TID) | ORAL | 0 refills | Status: DC | PRN
Start: 2019-09-01 — End: 2020-11-07

## 2019-09-01 MED ORDER — IOHEXOL 300 MG/ML  SOLN
125.0000 mL | Freq: Once | INTRAMUSCULAR | Status: AC | PRN
  Administered 2019-09-01: 125 mL via INTRAVENOUS

## 2019-09-01 MED ORDER — DICYCLOMINE HCL 20 MG PO TABS
20.0000 mg | ORAL_TABLET | Freq: Two times a day (BID) | ORAL | 0 refills | Status: DC
Start: 2019-09-01 — End: 2020-11-11

## 2019-09-01 NOTE — ED Provider Notes (Addendum)
[facility] EMERGENCY DEPARTMENT Provider Note   CSN: [id number] Arrival date & time: 09/01/19  1744     History Chief Complaint  Patient presents with  . Abdominal Pain    Anna Moses is a 44 y.o. female with PMHx asthma, depression, and schizophrenia who presents to the ED today via EMS with complaint of gradual onset, constant, achy, epigastric abdominal pain x 2 days. Pt also complains of nausea, NBNB emesis, and diarrhea. She reports she typically will get pain when she starts her menstrual cycle which she started 3 days ago however her nausea and vomiting will typically dissipate after a day or two. She reports taking Phenergan at home however unable to keep anything down. Pt states she has some lower abdominal cramping typical for her menstrual pain however the excessive vomiting and epigastric pain is what brought her in to the ED today. She states she has been unable to eat or drink anything due to excessive vomiting.   Per chart review: Pt currently being followed by oncology s/2 liver masses. Hematological/Oncological History #Liver Masses 1) 05/31/2019: presented to [facility] Emergency Department with abdominal pain and vomiting. CT abdomen showed multiple small liver lesions which are new since9/2020 CTscan and most suspicious for metastatic disease. 2) 06/07/2019: establish care with Dr. Lorenso Courier  3) 06/11/2019:  US guided biopsy of liver lesion, findings consistent with localized inflammation  The history is provided by the patient and medical records.       Past Medical History:  Diagnosis Date  . Asthma   . Bronchitis   . Depression   . Obesity   . Schizophrenia [facility])     Patient Active Problem List   Diagnosis Date Noted  . Schizophrenia ([place]) 07/24/2019  . H. pylori infection 07/24/2019  . Seasonal allergies 07/24/2019  . Dysphagia 06/03/2019  . Nausea and vomiting 06/03/2019  . Liver lesion 06/03/2019  . Preop examination  06/03/2019    Past Surgical History:  Procedure Laterality Date  . CESAREAN SECTION    . COLONOSCOPY  06/27/2019  . UPPER GASTROINTESTINAL ENDOSCOPY  06/27/2019     OB History   No obstetric history on file.     Family History  Problem Relation Age of Onset  . Crohn's disease Mother   . Liver disease Father   . Clotting disorder Father   . Liver disease Brother   . Colon cancer Neg Hx   . Esophageal cancer Neg Hx   . Rectal cancer Neg Hx   . Stomach cancer Neg Hx     Social History   Tobacco Use  . Smoking status: Current Every Day Smoker    Types: Cigarettes  . Smokeless tobacco: Never Used  Substance Use Topics  . Alcohol use: No  . Drug use: Not Currently    Types: Marijuana    Home Medications Prior to Admission medications   Medication Sig Start Date End Date Taking? Authorizing Provider  chlorhexidine (HIBICLENS) 4 % external liquid Once a day for one week followed by once weekly. 08/20/19   Benay Pike, MD  dicyclomine (BENTYL) 20 MG tablet Take 1 tablet (20 mg total) by mouth 2 (two) times daily. 09/01/19   Alroy Bailiff, Keontre Defino, PA-C  loratadine (CLARITIN REDITABS) 10 MG dissolvable tablet Take 1 tablet (10 mg total) by mouth daily. As needed for allergy symptoms 08/20/19   Benay Pike, MD  ondansetron (ZOFRAN ODT) 4 MG disintegrating tablet Take 1 tablet (4 mg total) by mouth every  8 (eight) hours as needed for nausea or vomiting. 09/01/19   Alroy Bailiff, Saumya Hukill, PA-C  pantoprazole (PROTONIX) 40 MG tablet Take 1 tablet (40 mg total) by mouth daily. 06/27/19   Ladene Artist, MD  pantoprazole (PROTONIX) 40 MG tablet Take 1 tablet (40 mg total) by mouth 2 (two) times daily. 07/23/19   Benay Pike, MD  diphenhydrAMINE (BENADRYL) 25 MG tablet Take 1 tablet (25 mg total) by mouth every 6 (six) hours. Patient not taking: Reported on 12/12/2018 10/21/17 05/30/19  Charlesetta Shanks, MD  famotidine (PEPCID) 20 MG tablet Take 1 tablet (20 mg total) by mouth 2 (two) times  daily. 04/04/19 05/30/19  Isla Pence, MD  Ibuprofen-diphenhydrAMINE Cit (ADVIL PM) 200-38 MG TABS Take 2 tablets by mouth 2 (two) times daily as needed (headaches).  05/30/19  [provider]  metoCLOPramide (REGLAN) 10 MG tablet Take 1 tablet (10 mg total) by mouth every 6 (six) hours as needed for nausea (nausea/headache). 12/12/18 05/30/19  Antonietta Breach, PA-C    Allergies    Patient has no known allergies.  Review of Systems   Review of Systems  Constitutional: Negative for chills and fever.  Respiratory: Negative for shortness of breath.   Cardiovascular: Negative for chest pain.  Gastrointestinal: Positive for abdominal pain, diarrhea, nausea and vomiting.  All other systems reviewed and are negative.   Physical Exam Updated Vital Signs BP 130/65   Pulse (!) 51   Temp 98 F (36.7 C) (Oral)   Resp 16   Ht 5\' 1"  (1.549 m)   Wt 108.9 kg   LMP 09/01/2019   SpO2 99%   BMI 45.35 kg/m   Physical Exam Vitals and nursing note reviewed.  Constitutional:      Appearance: She is not ill-appearing or diaphoretic.  HENT:     Head: Normocephalic and atraumatic.  Eyes:     Conjunctiva/sclera: Conjunctivae normal.  Cardiovascular:     Rate and Rhythm: Normal rate and regular rhythm.     Heart sounds: Normal heart sounds.  Pulmonary:     Effort: Pulmonary effort is normal.     Breath sounds: Normal breath sounds. No wheezing, rhonchi or rales.  Abdominal:     Palpations: Abdomen is soft.     Tenderness: There is generalized abdominal tenderness and tenderness in the epigastric area. There is no right CVA tenderness, left CVA tenderness, guarding or rebound.  Musculoskeletal:     Cervical back: Neck supple.  Skin:    General: Skin is warm and dry.  Neurological:     Mental Status: She is alert.     ED Results / Procedures / Treatments   Labs (all labs ordered are listed, but only abnormal results are displayed) Labs Reviewed  COMPREHENSIVE METABOLIC PANEL -  Abnormal; Notable for the following components:      Result Value   Chloride 112 (*)    CO2 19 (*)    Glucose, Bld 167 (*)    All other components within normal limits  CBC - Abnormal; Notable for the following components:   RDW 15.9 (*)    Platelets 479 (*)    All other components within normal limits  URINALYSIS, ROUTINE W REFLEX MICROSCOPIC - Abnormal; Notable for the following components:   APPearance CLOUDY (*)    Hgb urine dipstick MODERATE (*)    Ketones, ur 80 (*)    Protein, ur 100 (*)    All other components within normal limits  LIPASE, BLOOD  I-STAT BETA HCG  BLOOD, ED ([facility], [facility], [facility] ONLY)    EKG None  Radiology CT Abdomen Pelvis W Contrast  Result Date: 09/01/2019 CLINICAL DATA:  Pain. EXAM: CT ABDOMEN AND PELVIS WITH CONTRAST TECHNIQUE: Multidetector CT imaging of the abdomen and pelvis was performed using the standard protocol following bolus administration of intravenous contrast. CONTRAST:  128mL OMNIPAQUE IOHEXOL 300 MG/ML  SOLN COMPARISON:  07/29/2019 FINDINGS: Lower chest: The lung bases are clear. The heart size is normal. Hepatobiliary: The liver is normal. Normal gallbladder.There is no biliary ductal dilation. Pancreas: Normal contours without ductal dilatation. No peripancreatic fluid collection. Spleen: Unremarkable. Adrenals/Urinary Tract: --Adrenal glands: Unremarkable. --Right kidney/ureter: No hydronephrosis or radiopaque kidney stones. --Left kidney/ureter: No hydronephrosis or radiopaque kidney stones. --Urinary bladder: Unremarkable. Stomach/Bowel: --Stomach/Duodenum: No hiatal hernia or other gastric abnormality. Normal duodenal course and caliber. --Small bowel: Unremarkable. --Colon: Unremarkable. --Appendix: Normal. Vascular/Lymphatic: Normal course and caliber of the major abdominal vessels. --No retroperitoneal lymphadenopathy. --No mesenteric lymphadenopathy. --No pelvic or inguinal lymphadenopathy. Reproductive: A fibroid uterus. Other: There is a small  stable low-attenuation nodule in the low anterior abdominal cavity. The abdominal wall is normal. Musculoskeletal. No acute displaced fractures. IMPRESSION: No acute abdominopelvic abnormality. Electronically Signed   By: Constance Holster M.D.   On: 09/01/2019 20:58    Procedures Procedures (including critical care time)  Medications Ordered in ED Medications  sodium chloride flush (NS) 0.9 % injection 3 mL (3 mLs Intravenous Given 09/01/19 1942)  sodium chloride 0.9 % bolus 1,000 mL (1,000 mLs Intravenous New Bag/Given 09/01/19 1942)  morphine 4 MG/ML injection 4 mg (4 mg Intravenous Given 09/01/19 2015)  ondansetron (ZOFRAN) injection 4 mg (4 mg Intravenous Given 09/01/19 2015)  iohexol (OMNIPAQUE) 300 MG/ML solution 125 mL (125 mLs Intravenous Contrast Given 09/01/19 2040)    ED Course  I have reviewed the triage vital signs and the nursing notes.  Pertinent labs & imaging results that were available during my care of the patient were reviewed by me and considered in my medical decision making (see chart for details).    MDM Rules/Calculators/A&P                      44 year old female presents the ED today complaining of epigastric pain, nausea, vomiting, diarrhea for the past several days.  States this typically happens to her when she starts her menstrual cycle and is complaining of some lower abdominal cramping however this is not severe and she mostly came for the nausea, vomiting, diarrhea.  On arrival to the ED patient is afebrile, nontachycardic nontachypneic.  She appears slightly uncomfortable.  She does have diffuse abdominal tenderness however worse in the epigastrium, no rebound or guarding.  However patient does have a history of multiple liver lesions and is currently being evaluated by oncology.  Given this and abdominal pain will obtain CT scan at this time.  Will obtain labs, fluids, Zofran, morphine for pain.   CBC without leukocytosis.  Hemoglobin stable.  CMP with  glucose 167 and bicarb 19 however no gap.  Chloride 112.  No other electrolyte abnormalities.  Lipase 22. U/A with moderate hgb; pt currently on her menses. No pelvic pain or vaginal discharge.   CT scan without any acute findings.   Reevaluation patient states she would like to try to drink something.  States that she has not vomited since in the ED.  Will fluid challenge.   Able to tolerate water without difficulty.  States she is ready to go home.  Will discharge at this time.  Will discharge with Zofran ODT as well as Bentyl.  Patient has a follow-up appointment with her PCP on Tuesday, advised to keep.  Strict return precautions have been discussed with patient.  She is in agreement with plan is stable for discharge home.   This note was prepared using Dragon voice recognition software and may include unintentional dictation errors due to the inherent limitations of voice recognition software.  Final Clinical Impression(s) / ED Diagnoses Final diagnoses:  Generalized abdominal pain  Gastroenteritis    Rx / DC Orders ED Discharge Orders         Ordered    ondansetron (ZOFRAN ODT) 4 MG disintegrating tablet  Every 8 hours PRN     09/01/19 2131    dicyclomine (BENTYL) 20 MG tablet  2 times daily     09/01/19 2131           Discharge Instructions     Your labwork and imaging were reassuring today. Please pick up medication and take as needed for your symptoms. Follow up with your PCP. Return to the ED for any worsening symptoms.         Eustaquio Maize, PA-C 09/01/19 2134    Valarie Merino, MD 09/02/19 1536

## 2019-09-01 NOTE — ED Notes (Signed)
Anna Moses, mom, [phone] would like an update when available

## 2019-09-01 NOTE — ED Triage Notes (Signed)
Pt to triage via [facility].  C/o generalized abd pain, nausea, vomiting, and diarrhea since yesterday.  EMS administered Zofran 4mg  IV.

## 2019-09-01 NOTE — ED Notes (Signed)
Patient verbalizes understanding of discharge instructions. Opportunity for questioning and answers were provided. Armband removed by staff, pt discharged from ED to home with family via [place].

## 2019-09-01 NOTE — ED Notes (Signed)
Sharee Holster daughter [phone] would like to speak with pt

## 2019-09-01 NOTE — Discharge Instructions (Addendum)
Your labwork and imaging were reassuring today. Please pick up medication and take as needed for your symptoms. Follow up with your PCP. Return to the ED for any worsening symptoms.

## 2019-09-03 ENCOUNTER — Ambulatory Visit (HOSPITAL_COMMUNITY)
Admission: EM | Admit: 2019-09-03 | Discharge: 2019-09-03 | Disposition: A | Discharge status: Home / Self Care | Payer: Medicaid Other | Attending: Family Medicine | Admitting: Family Medicine

## 2019-09-03 ENCOUNTER — Ambulatory Visit (INDEPENDENT_AMBULATORY_CARE_PROVIDER_SITE_OTHER): Payer: Medicaid Other

## 2019-09-03 ENCOUNTER — Encounter: Payer: Self-pay | Admitting: Family Medicine

## 2019-09-03 ENCOUNTER — Telehealth (INDEPENDENT_AMBULATORY_CARE_PROVIDER_SITE_OTHER): Payer: Medicaid Other | Admitting: Family Medicine

## 2019-09-03 ENCOUNTER — Other Ambulatory Visit: Payer: Self-pay

## 2019-09-03 ENCOUNTER — Telehealth: Payer: Self-pay

## 2019-09-03 ENCOUNTER — Encounter (HOSPITAL_COMMUNITY): Payer: Self-pay

## 2019-09-03 DIAGNOSIS — R1013 Epigastric pain: Secondary | ICD-10-CM | POA: Diagnosis not present

## 2019-09-03 DIAGNOSIS — R1115 Cyclical vomiting syndrome unrelated to migraine: Secondary | ICD-10-CM

## 2019-09-03 MED ORDER — LIDOCAINE VISCOUS HCL 2 % MT SOLN
OROMUCOSAL | Status: AC
  Filled 2019-09-03: qty 15

## 2019-09-03 MED ORDER — ALUM & MAG HYDROXIDE-SIMETH 200-200-20 MG/5ML PO SUSP
30.0000 mL | Freq: Once | ORAL | Status: AC
  Administered 2019-09-03: 30 mL via ORAL

## 2019-09-03 MED ORDER — LIDOCAINE VISCOUS HCL 2 % MT SOLN
15.0000 mL | Freq: Once | OROMUCOSAL | Status: AC
  Administered 2019-09-03: 15 mL via ORAL

## 2019-09-03 MED ORDER — ONDANSETRON 4 MG PO TBDP
4.0000 mg | ORAL_TABLET | Freq: Once | ORAL | Status: AC
  Administered 2019-09-03: 4 mg via ORAL

## 2019-09-03 MED ORDER — PROMETHAZINE HCL 25 MG PO TABS
25.0000 mg | ORAL_TABLET | Freq: Four times a day (QID) | ORAL | 0 refills | Status: DC | PRN
Start: 2019-09-03 — End: 2021-02-08

## 2019-09-03 MED ORDER — ALUM & MAG HYDROXIDE-SIMETH 200-200-20 MG/5ML PO SUSP
ORAL | Status: AC
  Filled 2019-09-03: qty 30

## 2019-09-03 MED ORDER — ONDANSETRON 4 MG PO TBDP
ORAL_TABLET | ORAL | Status: AC
  Filled 2019-09-03: qty 1

## 2019-09-03 MED ORDER — SUCRALFATE 1 G PO TABS
1.0000 g | ORAL_TABLET | Freq: Three times a day (TID) | ORAL | 0 refills | Status: DC
Start: 2019-09-03 — End: 2021-02-08

## 2019-09-03 NOTE — Progress Notes (Signed)
[facility] Telemedicine Visit  Patient consented to have virtual visit and was identified by name and date of birth. Method of visit: Telephone  Encounter participants: Patient: Anna Moses - located at home Provider: Benay Pike - located at [facility] Others (if applicable): none  Chief Complaint: n/v abdominal pain  HPI:  Patient has been complaining of nausea and vomiting  since last Thursday when she started her menstrual cycle.  She says this has been occurring with her periods since she was 23, but this time is the worst it has been.  She developed abdominal pain 2 days ago.  She has only had one episode of diarrhea, this was on Thursday.  She has been taking ClearLax during this time.  She also takes pantoprazole and dicyclomine, which she thinks she got from the emergency department.  She is also using Zofran dissolvable tablet which she got at the urgent care.  She is not taking ibuprofen or Tylenol because it upsets her stomach.  She used to take the Depo shot for birth control prior to having her tubes tied, but stopped this medication after the procedure.  When she was taking Depo she did not have this issue with nausea and vomiting.  Patient has a history of migraines and smokes cigarettes.  ROS: per HPI  Pertinent PMHx: schizophrenia,   Exam:  Respiratory: Speaking in full sentences.  No coughing.  Assessment/Plan:  Cyclical vomiting Symptoms appear to be related to her menses.  Likely endometriosis, but CT scan from 2 days ago did not show any abnormalities.  Symptoms were resolved when she was not having her period.  Patient not a good candidate for OCPs, but reasonable to start Depo shot again since it was well-tolerated previously.  Called patient to inform her of this and asked her to make an appointment with [place] for next week or the week after so we can give her the Depo shot and perform the Pap smear that was supposed to happen today.  May  warrant a GI consult or Gync onsult in the future if not improved with treatment.    Time spent during visit with patient: 20 minutes

## 2019-09-03 NOTE — ED Triage Notes (Signed)
Pt reports nausea and abdominal pain x 3 days. Pt was seen at the ED for the same chief complaint 2 days ago. Pt taking Zofran and Bentyl without relief.

## 2019-09-03 NOTE — ED Notes (Signed)
Apical pulse, 48bpm. Pt denies CP, SOB or pain to arm/s, jaw. EKG performed per T. Rozanna Box

## 2019-09-03 NOTE — Discharge Instructions (Addendum)
Your x-ray did not show anything concerning.  You do have moderate in the stool.  I would continue the MiraLAX daily until you get a good bowel movement. Make sure you drink plenty of water.  I am prescribing Phenergan for nausea to use as needed and Carafate to take 4 times a day with meals and at bedtime.  This will help coat the stomach and hopefully help with pain from gastritis. Keep taking your Protonix daily as prescribed If your symptoms continue you will need to follow-up with your primary care or gastroenterologist

## 2019-09-03 NOTE — Telephone Encounter (Signed)
Patient calls nurse line complaining of nausea and vomiting. Patient reports symptoms since the weekend. Patient reports she went to ED and was given nausea medication that has worked "so so." Patient reports she can not keep anything down. Patient has an apt already today for a pap smear. I advised patient I will be switching this apt to virtual to discuss current symptoms. Patient advised to stay well hydrated. ED precautions given.

## 2019-09-03 NOTE — Assessment & Plan Note (Signed)
Symptoms appear to be related to her menses.  Likely endometriosis, but CT scan from 2 days ago did not show any abnormalities.  Symptoms were resolved when she was not having her period.  Patient not a good candidate for OCPs, but reasonable to start Depo shot again since it was well-tolerated previously.  Called patient to inform her of this and asked her to make an appointment with [place] for next week or the week after so we can give her the Depo shot and perform the Pap smear that was supposed to happen today.  May warrant a GI consult or Gync onsult in the future if not improved with treatment.

## 2019-09-04 ENCOUNTER — Other Ambulatory Visit: Payer: Self-pay

## 2019-09-04 ENCOUNTER — Emergency Department (HOSPITAL_COMMUNITY)
Admission: EM | Admit: 2019-09-04 | Discharge: 2019-09-04 | Disposition: A | Discharge status: Home / Self Care | Payer: Medicaid Other | Attending: Emergency Medicine | Admitting: Emergency Medicine

## 2019-09-04 ENCOUNTER — Encounter (HOSPITAL_COMMUNITY): Payer: Self-pay | Admitting: *Deleted

## 2019-09-04 DIAGNOSIS — R109 Unspecified abdominal pain: Secondary | ICD-10-CM | POA: Diagnosis not present

## 2019-09-04 DIAGNOSIS — R112 Nausea with vomiting, unspecified: Secondary | ICD-10-CM | POA: Diagnosis present

## 2019-09-04 DIAGNOSIS — F17210 Nicotine dependence, cigarettes, uncomplicated: Secondary | ICD-10-CM | POA: Insufficient documentation

## 2019-09-04 DIAGNOSIS — Z79899 Other long term (current) drug therapy: Secondary | ICD-10-CM | POA: Diagnosis not present

## 2019-09-04 DIAGNOSIS — R110 Nausea: Secondary | ICD-10-CM

## 2019-09-04 LAB — COMPREHENSIVE METABOLIC PANEL
ALT: 15 U/L (ref 0–44)
AST: 22 U/L (ref 15–41)
Albumin: 4.1 g/dL (ref 3.5–5.0)
Alkaline Phosphatase: 47 U/L (ref 38–126)
Anion gap: 11 (ref 5–15)
BUN: 14 mg/dL (ref 6–20)
CO2: 24 mmol/L (ref 22–32)
Calcium: 9.3 mg/dL (ref 8.9–10.3)
Chloride: 103 mmol/L (ref 98–111)
Creatinine, Ser: 1.07 mg/dL — ABNORMAL HIGH (ref 0.44–1.00)
GFR calc Af Amer: >60 mL/min (ref >60)
GFR calc non Af Amer: >60 mL/min (ref >60)
Glucose, Bld: 106 mg/dL — ABNORMAL HIGH (ref 70–99)
Potassium: 3.3 mmol/L — ABNORMAL LOW (ref 3.5–5.1)
Sodium: 138 mmol/L (ref 135–145)
Total Bilirubin: 0.4 mg/dL (ref 0.3–1.2)
Total Protein: 7.5 g/dL (ref 6.5–8.1)

## 2019-09-04 LAB — URINALYSIS, ROUTINE W REFLEX MICROSCOPIC
Bilirubin Urine: NEGATIVE
Glucose, UA: NEGATIVE mg/dL
Ketones, ur: 20 mg/dL — AB
Leukocytes,Ua: NEGATIVE
Nitrite: NEGATIVE
Protein, ur: 30 mg/dL — AB
Specific Gravity, Urine: 1.025 (ref 1.005–1.030)
pH: 6 (ref 5.0–8.0)

## 2019-09-04 LAB — CBC
HCT: 37 % (ref 36.0–46.0)
Hemoglobin: 12.1 g/dL (ref 12.0–15.0)
MCH: 27.6 pg (ref 26.0–34.0)
MCHC: 32.7 g/dL (ref 30.0–36.0)
MCV: 84.5 fL (ref 80.0–100.0)
Platelets: 465 10*3/uL — ABNORMAL HIGH (ref 150–400)
RBC: 4.38 MIL/uL (ref 3.87–5.11)
RDW: 15.7 % — ABNORMAL HIGH (ref 11.5–15.5)
WBC: 8.5 10*3/uL (ref 4.0–10.5)
nRBC: 0 % (ref 0.0–0.2)

## 2019-09-04 LAB — I-STAT BETA HCG BLOOD, ED (MC, WL, AP ONLY): I-stat hCG, quantitative: <5 m[IU]/mL (ref <5)

## 2019-09-04 LAB — LIPASE, BLOOD: Lipase: 21 U/L (ref 11–51)

## 2019-09-04 MED ORDER — PROCHLORPERAZINE EDISYLATE 10 MG/2ML IJ SOLN: 10.0000 mg | Freq: Once | INTRAMUSCULAR | Status: DC

## 2019-09-04 MED ORDER — ALUM & MAG HYDROXIDE-SIMETH 200-200-20 MG/5ML PO SUSP
30.0000 mL | Freq: Once | ORAL | Status: AC
  Administered 2019-09-04: 30 mL via ORAL
  Filled 2019-09-04: qty 30

## 2019-09-04 MED ORDER — HALOPERIDOL LACTATE 5 MG/ML IJ SOLN
5.0000 mg | Freq: Once | INTRAMUSCULAR | Status: AC
  Administered 2019-09-04: 5 mg via INTRAVENOUS
  Filled 2019-09-04: qty 1

## 2019-09-04 MED ORDER — SODIUM CHLORIDE 0.9% FLUSH
3.0000 mL | Freq: Once | INTRAVENOUS | Status: AC
  Administered 2019-09-04: 3 mL via INTRAVENOUS

## 2019-09-04 MED ORDER — SODIUM CHLORIDE 0.9 % IV BOLUS
1000.0000 mL | Freq: Once | INTRAVENOUS | Status: AC
  Administered 2019-09-04: 1000 mL via INTRAVENOUS

## 2019-09-04 MED ORDER — DIPHENHYDRAMINE HCL 50 MG/ML IJ SOLN
25.0000 mg | Freq: Once | INTRAMUSCULAR | Status: AC
  Administered 2019-09-04: 25 mg via INTRAVENOUS
  Filled 2019-09-04: qty 1

## 2019-09-04 MED ORDER — LIDOCAINE VISCOUS HCL 2 % MT SOLN
15.0000 mL | Freq: Once | OROMUCOSAL | Status: AC
  Administered 2019-09-04: 15 mL via ORAL
  Filled 2019-09-04: qty 15

## 2019-09-04 NOTE — ED Provider Notes (Signed)
Anna Moses    CSN: [id number] Arrival date & time: 09/03/19  1119      History   Chief Complaint Chief Complaint  Patient presents with  . Nausea  . Abdominal Pain    HPI Anna Moses is a 44 y.o. female.   Patient is a 44 year old female that presents today with continued epigastric discomfort, nausea, vomiting.  Was seen in the ER on 5/23 with similar complaints.  Had CT scan at that time with negative results.  Has been taking the Bentyl and Zofran as prescribed without any relief.  Has had loss of appetite and unable to keep anything down.  Denies any fevers, diarrhea.  Reports she is taking her Protonix daily.  Reviewed results of endoscopy and colonoscopy from a few months prior.  Nothing concerning at that time.  Does have history of gastritis and cyclic vomiting.  Has also been treated for H. pylori in the past.  No fevers, chills, body aches, cough, chest pain or shortness of breath.  ROS per HPI      Past Medical History:  Diagnosis Date  . Asthma   . Bronchitis   . Depression   . Obesity   . Schizophrenia [facility])     Patient Active Problem List   Diagnosis Date Noted  . Cyclical vomiting [id number]  . Schizophrenia ([place]) 07/24/2019  . H. pylori infection 07/24/2019  . Seasonal allergies 07/24/2019  . Dysphagia 06/03/2019  . Nausea and vomiting 06/03/2019  . Liver lesion 06/03/2019  . Preop examination 06/03/2019    Past Surgical History:  Procedure Laterality Date  . CESAREAN SECTION    . COLONOSCOPY  06/27/2019  . UPPER GASTROINTESTINAL ENDOSCOPY  06/27/2019    OB History   No obstetric history on file.      Home Medications    Prior to Admission medications   Medication Sig Start Date End Date Taking? Authorizing Provider  risperiDONE (RISPERDAL) 1 MG tablet Take 1 mg by mouth at bedtime.   Yes [provider]  sertraline (ZOLOFT) 50 MG tablet Take 50 mg by mouth daily.   Yes [provider]    chlorhexidine (HIBICLENS) 4 % external liquid Once a day for one week followed by once weekly. Patient not taking: Reported on 09/04/2019 08/20/19   Benay Pike, MD  dicyclomine (BENTYL) 20 MG tablet Take 1 tablet (20 mg total) by mouth 2 (two) times daily. 09/01/19   Alroy Bailiff, Margaux, PA-C  loratadine (CLARITIN REDITABS) 10 MG dissolvable tablet Take 1 tablet (10 mg total) by mouth daily. As needed for allergy symptoms Patient not taking: Reported on 09/04/2019 08/20/19   Benay Pike, MD  ondansetron (ZOFRAN ODT) 4 MG disintegrating tablet Take 1 tablet (4 mg total) by mouth every 8 (eight) hours as needed for nausea or vomiting. 09/01/19   Alroy Bailiff, Margaux, PA-C  pantoprazole (PROTONIX) 40 MG tablet Take 1 tablet (40 mg total) by mouth daily. Patient taking differently: Take 40 mg by mouth 2 (two) times daily.  06/27/19   Ladene Artist, MD  pantoprazole (PROTONIX) 40 MG tablet Take 1 tablet (40 mg total) by mouth 2 (two) times daily. Patient not taking: Reported on 09/04/2019 07/23/19   Benay Pike, MD  promethazine (PHENERGAN) 25 MG tablet Take 1 tablet (25 mg total) by mouth every 6 (six) hours as needed for nausea or vomiting. Patient not taking: Reported on 09/04/2019 09/03/19   Loura Halt A, NP  sucralfate (CARAFATE) 1 g tablet Take  1 tablet (1 g total) by mouth 4 (four) times daily -  with meals and at bedtime. Patient not taking: Reported on 09/04/2019 09/03/19   Loura Halt A, NP  diphenhydrAMINE (BENADRYL) 25 MG tablet Take 1 tablet (25 mg total) by mouth every 6 (six) hours. Patient not taking: Reported on 12/12/2018 10/21/17 05/30/19  Charlesetta Shanks, MD  famotidine (PEPCID) 20 MG tablet Take 1 tablet (20 mg total) by mouth 2 (two) times daily. 04/04/19 05/30/19  Isla Pence, MD  Ibuprofen-diphenhydrAMINE Cit (ADVIL PM) 200-38 MG TABS Take 2 tablets by mouth 2 (two) times daily as needed (headaches).  05/30/19  [provider]  metoCLOPramide (REGLAN) 10 MG tablet Take 1  tablet (10 mg total) by mouth every 6 (six) hours as needed for nausea (nausea/headache). 12/12/18 05/30/19  Antonietta Breach, PA-C    Family History Family History  Problem Relation Age of Onset  . Crohn's disease Mother   . Liver disease Father   . Clotting disorder Father   . Liver disease Brother   . Colon cancer Neg Hx   . Esophageal cancer Neg Hx   . Rectal cancer Neg Hx   . Stomach cancer Neg Hx     Social History Social History   Tobacco Use  . Smoking status: Current Every Day Smoker    Types: Cigarettes  . Smokeless tobacco: Never Used  Substance Use Topics  . Alcohol use: No  . Drug use: Not Currently    Types: Marijuana     Allergies   Patient has no known allergies.   Review of Systems Review of Systems   Physical Exam Triage Vital Signs ED Triage Vitals  Enc Vitals Group     BP 09/03/19 1212 (!) 155/76     Pulse Rate 09/03/19 1212 (!) 45     Resp 09/03/19 1212 (!) 22     Temp 09/03/19 1212 98.6 F (37 C)     Temp Source 09/03/19 1212 Oral     SpO2 09/03/19 1212 99 %     Weight --      Height --      Head Circumference --      Peak Flow --      Pain Score 09/03/19 1210 8     Pain Loc --      Pain Edu? --      Excl. in [place]? --    No data found.  Updated Vital Signs BP (!) 155/76 (BP Location: Right Arm)   Pulse (!) 45   Temp 98.6 F (37 C) (Oral)   Resp (!) 22   LMP 08/29/2019 (Exact Date)   SpO2 99%   Visual Acuity Right Eye Distance:   Left Eye Distance:   Bilateral Distance:    Right Eye Near:   Left Eye Near:    Bilateral Near:     Physical Exam Vitals and nursing note reviewed.  Constitutional:      General: She is not in acute distress.    Appearance: Normal appearance. She is not ill-appearing, toxic-appearing or diaphoretic.  HENT:     Head: Normocephalic.     Nose: Nose normal.     Mouth/Throat:     Pharynx: Oropharynx is clear.  Eyes:     Conjunctiva/sclera: Conjunctivae normal.  Cardiovascular:     Rate and  Rhythm: Normal rate and regular rhythm.  Pulmonary:     Effort: Pulmonary effort is normal.     Breath sounds: Normal breath sounds.  Abdominal:  General: Bowel sounds are normal.     Palpations: Abdomen is soft.     Tenderness: There is abdominal tenderness in the epigastric area. There is no guarding or rebound.  Musculoskeletal:        General: Normal range of motion.     Cervical back: Normal range of motion.  Skin:    General: Skin is warm and dry.     Findings: No rash.  Neurological:     Mental Status: She is alert.  Psychiatric:        Mood and Affect: Mood normal.      UC Treatments / Results  Labs (all labs ordered are listed, but only abnormal results are displayed) Labs Reviewed - No data to display  EKG   Radiology DG Abd 2 Views  Result Date: 09/03/2019 CLINICAL DATA:  Abdominal pain. EXAM: ABDOMEN - 2 VIEW COMPARISON:  CT 09/01/2019. FINDINGS: Soft tissue structures are unremarkable. Several nondistended air-filled loops of small bowel noted. This is nonspecific. No bowel distention or free air. Stool noted throughout the colon. Thoracolumbar spine scoliosis and degenerative change. Degenerative changes both hips. Pelvic calcifications consistent phleboliths. IMPRESSION: No acute abnormality identified. Electronically Signed   By: Marcello Moores  Register   On: 09/03/2019 13:37    Procedures Procedures (including critical care time)  Medications Ordered in UC Medications  ondansetron (ZOFRAN-ODT) disintegrating tablet 4 mg (4 mg Oral Given 09/03/19 1248)  alum & mag hydroxide-simeth (MAALOX/MYLANTA) 200-200-20 MG/5ML suspension 30 mL (30 mLs Oral Given 09/03/19 1251)    And  lidocaine (XYLOCAINE) 2 % viscous mouth solution 15 mL (15 mLs Oral Given 09/03/19 1251)    Initial Impression / Assessment and Plan / UC Course  I have reviewed the triage vital signs and the nursing notes.  Pertinent labs & imaging results that were available during my care of the  patient were reviewed by me and considered in my medical decision making (see chart for details).     Epigastric pain Most likely gastritis related. X-ray negative for any bowel obstruction.  Does show moderate stool.  Recommended continue the MiraLAX daily Prescribed Phenergan for nausea to use as needed and Carafate 4 times a day with meals and at bedtime.  Recommended continue the Protonix daily as prescribed. Reviewed CT from yesterday.  Nothing concerning Lab work mostly unremarkable at that time. GI cocktail given here and Zofran given here with relief of symptoms. Patient was able to eat ice chips and felt better prior to discharge. EKG was sinus bradycardia and similar to previous EKGs in the past. Referral given to follow back up with GI specialist for continued issues Final Clinical Impressions(s) / UC Diagnoses   Final diagnoses:  Epigastric pain     Discharge Instructions     Your x-ray did not show anything concerning.  You do have moderate in the stool.  I would continue the MiraLAX daily until you get a good bowel movement. Make sure you drink plenty of water.  I am prescribing Phenergan for nausea to use as needed and Carafate to take 4 times a day with meals and at bedtime.  This will help coat the stomach and hopefully help with pain from gastritis. Keep taking your Protonix daily as prescribed If your symptoms continue you will need to follow-up with your primary care or gastroenterologist    ED Prescriptions    Medication Sig Dispense Auth. Provider   sucralfate (CARAFATE) 1 g tablet Take 1 tablet (1 g total) by mouth 4 (  four) times daily -  with meals and at bedtime. Patient not taking:  Reported on 09/04/2019 30 tablet Zadia Uhde A, NP   promethazine (PHENERGAN) 25 MG tablet Take 1 tablet (25 mg total) by mouth every 6 (six) hours as needed for nausea or vomiting. Patient not taking:  Reported on 09/04/2019 30 tablet Loura Halt A, NP     PDMP not reviewed  this encounter.   Loura Halt A, NP 09/04/19 1016

## 2019-09-04 NOTE — ED Provider Notes (Signed)
[facility] DEPT Provider Note   CSN: [id number] Arrival date & time: 09/04/19  0532     History Chief Complaint  Patient presents with  . Nausea    Anna Moses is a 44 y.o. female.  The history is provided by the patient.  Emesis Severity:  Moderate Timing:  Intermittent Progression:  Unchanged Chronicity:  Recurrent Relieved by:  Nothing Worsened by:  Nothing Ineffective treatments:  None tried Associated symptoms: abdominal pain   Associated symptoms: no arthralgias, no chills, no cough, no diarrhea, no fever, no headaches, no myalgias, no sore throat and no URI   Risk factors comment:  Cyclical vomiting      Past Medical History:  Diagnosis Date  . Asthma   . Bronchitis   . Depression   . Obesity   . Schizophrenia [facility])     Patient Active Problem List   Diagnosis Date Noted  . Cyclical vomiting [id number]  . Schizophrenia ([place]) 07/24/2019  . H. pylori infection 07/24/2019  . Seasonal allergies 07/24/2019  . Dysphagia 06/03/2019  . Nausea and vomiting 06/03/2019  . Liver lesion 06/03/2019  . Preop examination 06/03/2019    Past Surgical History:  Procedure Laterality Date  . CESAREAN SECTION    . COLONOSCOPY  06/27/2019  . UPPER GASTROINTESTINAL ENDOSCOPY  06/27/2019     OB History   No obstetric history on file.     Family History  Problem Relation Age of Onset  . Crohn's disease Mother   . Liver disease Father   . Clotting disorder Father   . Liver disease Brother   . Colon cancer Neg Hx   . Esophageal cancer Neg Hx   . Rectal cancer Neg Hx   . Stomach cancer Neg Hx     Social History   Tobacco Use  . Smoking status: Current Every Day Smoker    Types: Cigarettes  . Smokeless tobacco: Never Used  Substance Use Topics  . Alcohol use: No  . Drug use: Not Currently    Types: Marijuana    Home Medications Prior to Admission medications   Medication Sig Start Date End Date Taking?  Authorizing Provider  dicyclomine (BENTYL) 20 MG tablet Take 1 tablet (20 mg total) by mouth 2 (two) times daily. 09/01/19  Yes Venter, Margaux, PA-C  ondansetron (ZOFRAN ODT) 4 MG disintegrating tablet Take 1 tablet (4 mg total) by mouth every 8 (eight) hours as needed for nausea or vomiting. 09/01/19  Yes Alroy Bailiff, Margaux, PA-C  pantoprazole (PROTONIX) 40 MG tablet Take 1 tablet (40 mg total) by mouth daily. Patient taking differently: Take 40 mg by mouth 2 (two) times daily.  06/27/19  Yes Ladene Artist, MD  risperiDONE (RISPERDAL) 1 MG tablet Take 1 mg by mouth at bedtime.   Yes [provider]  sertraline (ZOLOFT) 50 MG tablet Take 50 mg by mouth daily.   Yes [provider]  chlorhexidine (HIBICLENS) 4 % external liquid Once a day for one week followed by once weekly. Patient not taking: Reported on 09/04/2019 08/20/19   Benay Pike, MD  loratadine (CLARITIN REDITABS) 10 MG dissolvable tablet Take 1 tablet (10 mg total) by mouth daily. As needed for allergy symptoms Patient not taking: Reported on 09/04/2019 08/20/19   Benay Pike, MD  pantoprazole (PROTONIX) 40 MG tablet Take 1 tablet (40 mg total) by mouth 2 (two) times daily. Patient not taking: Reported on 09/04/2019 07/23/19   Benay Pike, MD  promethazine [facility])  25 MG tablet Take 1 tablet (25 mg total) by mouth every 6 (six) hours as needed for nausea or vomiting. Patient not taking: Reported on 09/04/2019 09/03/19   Loura Halt A, NP  sucralfate (CARAFATE) 1 g tablet Take 1 tablet (1 g total) by mouth 4 (four) times daily -  with meals and at bedtime. Patient not taking: Reported on 09/04/2019 09/03/19   Loura Halt A, NP  diphenhydrAMINE (BENADRYL) 25 MG tablet Take 1 tablet (25 mg total) by mouth every 6 (six) hours. Patient not taking: Reported on 12/12/2018 10/21/17 05/30/19  Charlesetta Shanks, MD  famotidine (PEPCID) 20 MG tablet Take 1 tablet (20 mg total) by mouth 2 (two) times daily. 04/04/19 05/30/19   Isla Pence, MD  Ibuprofen-diphenhydrAMINE Cit (ADVIL PM) 200-38 MG TABS Take 2 tablets by mouth 2 (two) times daily as needed (headaches).  05/30/19  [provider]  metoCLOPramide (REGLAN) 10 MG tablet Take 1 tablet (10 mg total) by mouth every 6 (six) hours as needed for nausea (nausea/headache). 12/12/18 05/30/19  Antonietta Breach, PA-C    Allergies    Patient has no known allergies.  Review of Systems   Review of Systems  Constitutional: Negative for chills and fever.  HENT: Negative for ear pain and sore throat.   Eyes: Negative for pain and visual disturbance.  Respiratory: Negative for cough and shortness of breath.   Cardiovascular: Negative for chest pain and palpitations.  Gastrointestinal: Positive for abdominal pain, nausea and vomiting. Negative for diarrhea.  Genitourinary: Negative for dysuria and hematuria.  Musculoskeletal: Negative for arthralgias, back pain and myalgias.  Skin: Negative for color change and rash.  Neurological: Negative for seizures, syncope and headaches.  All other systems reviewed and are negative.   Physical Exam Updated Vital Signs BP (!) 177/85   Pulse (!) 50   Temp 99.3 F (37.4 C) (Oral)   Resp (!) 23   Ht 5\' 1"  (1.549 m)   Wt 108.9 kg   LMP 09/01/2019   SpO2 100%   BMI 45.35 kg/m   Physical Exam Vitals and nursing note reviewed.  Constitutional:      General: She is not in acute distress.    Appearance: She is well-developed. She is not ill-appearing.  HENT:     Head: Normocephalic and atraumatic.     Nose: Nose normal.     Mouth/Throat:     Mouth: Mucous membranes are moist.  Eyes:     Extraocular Movements: Extraocular movements intact.     Conjunctiva/sclera: Conjunctivae normal.     Pupils: Pupils are equal, round, and reactive to light.  Cardiovascular:     Rate and Rhythm: Normal rate and regular rhythm.     Pulses: Normal pulses.     Heart sounds: Normal heart sounds. No murmur.  Pulmonary:     Effort:  Pulmonary effort is normal. No respiratory distress.     Breath sounds: Normal breath sounds.  Abdominal:     General: There is no distension.     Palpations: Abdomen is soft. There is no mass.     Tenderness: There is abdominal tenderness (epigastric). There is no right CVA tenderness, guarding or rebound.     Hernia: No hernia is present.  Musculoskeletal:     Cervical back: Normal range of motion and neck supple.  Skin:    General: Skin is warm and dry.     Capillary Refill: Capillary refill takes less than 2 seconds.  Neurological:     General:  No focal deficit present.     Mental Status: She is alert.     ED Results / Procedures / Treatments   Labs (all labs ordered are listed, but only abnormal results are displayed) Labs Reviewed  COMPREHENSIVE METABOLIC PANEL - Abnormal; Notable for the following components:      Result Value   Potassium 3.3 (*)    Glucose, Bld 106 (*)    Creatinine, Ser 1.07 (*)    All other components within normal limits  CBC - Abnormal; Notable for the following components:   RDW 15.7 (*)    Platelets 465 (*)    All other components within normal limits  URINALYSIS, ROUTINE W REFLEX MICROSCOPIC - Abnormal; Notable for the following components:   APPearance HAZY (*)    Hgb urine dipstick LARGE (*)    Ketones, ur 20 (*)    Protein, ur 30 (*)    Bacteria, UA RARE (*)    All other components within normal limits  LIPASE, BLOOD  I-STAT BETA HCG BLOOD, ED ([facility], [facility], [facility] ONLY)    EKG None  Radiology DG Abd 2 Views  Result Date: 09/03/2019 CLINICAL DATA:  Abdominal pain. EXAM: ABDOMEN - 2 VIEW COMPARISON:  CT 09/01/2019. FINDINGS: Soft tissue structures are unremarkable. Several nondistended air-filled loops of small bowel noted. This is nonspecific. No bowel distention or free air. Stool noted throughout the colon. Thoracolumbar spine scoliosis and degenerative change. Degenerative changes both hips. Pelvic calcifications consistent phleboliths.  IMPRESSION: No acute abnormality identified. Electronically Signed   By: Marcello Moores  Register   On: 09/03/2019 13:37    Procedures Procedures (including critical care time)  Medications Ordered in ED Medications  sodium chloride flush (NS) 0.9 % injection 3 mL (3 mLs Intravenous Given 09/04/19 0906)  sodium chloride 0.9 % bolus 1,000 mL (0 mLs Intravenous Stopped 09/04/19 1021)  diphenhydrAMINE (BENADRYL) injection 25 mg (25 mg Intravenous Given 09/04/19 0906)  haloperidol lactate (HALDOL) injection 5 mg (5 mg Intravenous Given 09/04/19 0906)  alum & mag hydroxide-simeth (MAALOX/MYLANTA) 200-200-20 MG/5ML suspension 30 mL (30 mLs Oral Given 09/04/19 1033)    And  lidocaine (XYLOCAINE) 2 % viscous mouth solution 15 mL (15 mLs Oral Given 09/04/19 1033)    ED Course  I have reviewed the triage vital signs and the nursing notes.  Pertinent labs & imaging results that were available during my care of the patient were reviewed by me and considered in my medical decision making (see chart for details).    MDM Rules/Calculators/A&P                      Anna Moses is a 44 year old female with history of schizophrenia, cyclical vomiting who presents to the ED with nausea and vomiting.  Patient with unremarkable vitals.  No fever.  Some epigastric abdominal pain.  Tenderness in this area but no peritonitis.  Lab work is already done prior to my evaluation.  No significant gallbladder or liver enzyme elevation.  Lipase is normal doubt pancreatitis.  No urinary symptoms.  No chest pain or shortness of breath.  Likely nausea and vomiting causing abdominal pain.  Will treat with Haldol, Benadryl, fluid bolus.  No concern for appendicitis, bowel obstruction, bowel perforation.  Has taken Zofran and Phenergan at home with minimal relief.  Urinalysis not consistent with infection.  Patient improved after medications and fluids.  Repeat abdominal exam is unremarkable.  Discharged in ED in good condition.   Recommend continued use of  Zofran and Phenergan at home.  Understands return precautions.  This chart was dictated using voice recognition software.  Despite best efforts to proofread,  errors can occur which can change the documentation meaning.     Final Clinical Impression(s) / ED Diagnoses Final diagnoses:  Nausea    Rx / DC Orders ED Discharge Orders    None       [name], DO 09/04/19 1044

## 2019-09-04 NOTE — ED Triage Notes (Signed)
Pt stating vomiting since Thursday, no diarrhea. LBM on Thursday, taking mirilax for constipation. Denies fevers. Has had virtual visit, UC visit and ED visit for the same this week.

## 2019-09-04 NOTE — ED Triage Notes (Signed)
Arrives via [place] from home. Per medic with Pt ac/o N/V seen at urgent care for the same yesterday. Overall, feels bad. 150/90,  96, 20, cbg 157. States "unable to keep any solids or liquids down" non complaint with risperidone doses. No relief from zofran or phenergan. persistently grinding teeth.

## 2019-09-04 NOTE — Discharge Instructions (Addendum)
Continue to take your Zofran at home.  Return if symptoms worsen.

## 2019-09-18 ENCOUNTER — Encounter: Payer: Self-pay | Admitting: Family Medicine

## 2019-09-18 ENCOUNTER — Ambulatory Visit (INDEPENDENT_AMBULATORY_CARE_PROVIDER_SITE_OTHER): Payer: Medicaid Other | Admitting: Family Medicine

## 2019-09-18 ENCOUNTER — Other Ambulatory Visit (HOSPITAL_COMMUNITY)
Admission: RE | Admit: 2019-09-18 | Discharge: 2019-09-18 | Disposition: A | Discharge status: Home / Self Care | Payer: Medicaid Other | Source: Ambulatory Visit | Attending: Family Medicine | Admitting: Family Medicine

## 2019-09-18 ENCOUNTER — Other Ambulatory Visit: Payer: Self-pay

## 2019-09-18 VITALS — BP 120/90 | HR 101 | Ht 61.0 in | Wt 235.4 lb

## 2019-09-18 DIAGNOSIS — Z113 Encounter for screening for infections with a predominantly sexual mode of transmission: Secondary | ICD-10-CM | POA: Insufficient documentation

## 2019-09-18 DIAGNOSIS — A048 Other specified bacterial intestinal infections: Secondary | ICD-10-CM | POA: Diagnosis not present

## 2019-09-18 DIAGNOSIS — Z30013 Encounter for initial prescription of injectable contraceptive: Secondary | ICD-10-CM

## 2019-09-18 DIAGNOSIS — Z124 Encounter for screening for malignant neoplasm of cervix: Secondary | ICD-10-CM | POA: Insufficient documentation

## 2019-09-18 LAB — POCT URINE PREGNANCY: Preg Test, Ur: NEGATIVE

## 2019-09-18 MED ORDER — PANTOPRAZOLE SODIUM 40 MG PO TBEC: 40.0000 mg | DELAYED_RELEASE_TABLET | Freq: Two times a day (BID) | ORAL | 0 refills | Status: DC

## 2019-09-18 MED ORDER — CLARITHROMYCIN 500 MG PO TABS
500.0000 mg | ORAL_TABLET | Freq: Two times a day (BID) | ORAL | 0 refills | Status: AC
Start: 2019-09-18 — End: 2019-10-02

## 2019-09-18 MED ORDER — AMOXICILLIN 500 MG PO CAPS
1000.0000 mg | ORAL_CAPSULE | Freq: Two times a day (BID) | ORAL | 0 refills | Status: AC
Start: 2019-09-18 — End: 2019-10-02

## 2019-09-18 MED ORDER — MEDROXYPROGESTERONE ACETATE 150 MG/ML IM SUSP
150.0000 mg | Freq: Once | INTRAMUSCULAR | Status: AC
  Administered 2019-09-18: 150 mg via INTRAMUSCULAR

## 2019-09-18 MED ORDER — METRONIDAZOLE 500 MG PO TABS
500.0000 mg | ORAL_TABLET | Freq: Two times a day (BID) | ORAL | 0 refills | Status: AC
Start: 2019-09-18 — End: 2019-10-02

## 2019-09-18 NOTE — Assessment & Plan Note (Signed)
Pap performed today.  Nothing unusual on general exam.  Follow-up with results.  Patient wanted STI testing as well -Follow-up Pap -Follow-up GC/chlamydia/HIV/RPR testing

## 2019-09-18 NOTE — Progress Notes (Signed)
    SUBJECTIVE:   CHIEF COMPLAINT / HPI:   Pap smear: Patient would like testing for gonorrhea/chlamydia and RPR/HIV blood test.  Not having any vaginal discharge currently.  Patient had her menstrual cycle last week.  Abdominal pain: Patient has abdominal pain when she wakes up in the morning and feels hungry.  She will usually have dinner at 6 PM, and then a late night snack before 11 PM.  If she does not eat anything in the morning when she feels hungry she will get abdominal pain and nausea.  If she then tries to force down food she will have vomiting and will not have an appetite that day.  She took her complete course of H. pylori treatment a few months ago.   PERTINENT  PMH / PSH: Schizophrenia, H. pylori infection  OBJECTIVE:   BP 120/90   Pulse (!) 101   Ht 5\' 1"  (1.549 m)   Wt 235 lb 6.4 oz (106.8 kg)   LMP 09/01/2019   SpO2 100%   BMI 44.48 kg/m   General: Alert and oriented.  No acute distress.  Patient's friend is sitting with her in the exam room. Abdominal: Normal bowel sounds.  Tender to palpation in the left upper quadrant. GU: Normal external genitalia.  No abnormalities on the vaginal wall.  Excessive tissue made visualization of cervix difficult.  ASSESSMENT/PLAN:   H. pylori infection Patient completed her course of quadruple therapy after previous EGD revealed H. pylori infection.  Has not had improvement of symptoms since that time.  Still taking the pantoprazole twice daily.  We will try it alternative H pylori gastritis treatment.  Follow-up in 2 weeks -Amoxicillin 1 g twice daily 14 days -Clarithromycin 500 mg twice daily 14 days -Metronidazole 500 mg twice daily 14 days -Continue pantoprazole twice daily 14 days -Keep GI appointment on the 22nd of this month  Cervical cancer screening Pap performed today.  Nothing unusual on general exam.  Follow-up with results.  Patient wanted STI testing as well -Follow-up Pap -Follow-up GC/chlamydia/HIV/RPR  testing     Benay Pike, MD [address]

## 2019-09-18 NOTE — Assessment & Plan Note (Signed)
Patient completed her course of quadruple therapy after previous EGD revealed H. pylori infection.  Has not had improvement of symptoms since that time.  Still taking the pantoprazole twice daily.  We will try it alternative H pylori gastritis treatment.  Follow-up in 2 weeks -Amoxicillin 1 g twice daily 14 days -Clarithromycin 500 mg twice daily 14 days -Metronidazole 500 mg twice daily 14 days -Continue pantoprazole twice daily 14 days -Keep GI appointment on the 22nd of this month

## 2019-09-18 NOTE — Patient Instructions (Addendum)
It was nice to see you today,  I will call you with the results of your STI testing and Pap smear results.  I believe your abdominal pain is from H. pylori infection that was previously seen but not adequately treated.  I would like you to take the following medications as prescribed: -Pantoprazole 40 mg twice a day for 14 days -Metronidazole/Flagyl 500 mg twice a day for 14 days -Clarithromycin 500 mg twice a day for 14 days -Amoxicillin 1 g (2 pills) twice a day for 14 days.  I would like you to schedule an appointment for 3 weeks from now so that we can discuss if this treatment works.  If not we will need to consider alternative causes to your abdominal pain.  Have a great day,  Clemetine Marker, MD

## 2019-09-19 LAB — HIV ANTIBODY (ROUTINE TESTING W REFLEX): HIV Screen 4th Generation wRfx: NONREACTIVE

## 2019-09-19 LAB — RPR: RPR Ser Ql: NONREACTIVE

## 2019-09-25 LAB — CYTOLOGY - PAP
Chlamydia: NEGATIVE
Comment: NEGATIVE
Comment: NEGATIVE
Comment: NEGATIVE
Comment: NORMAL
Diagnosis: NEGATIVE
High risk HPV: NEGATIVE
Neisseria Gonorrhea: NEGATIVE
Trichomonas: NEGATIVE

## 2019-10-01 ENCOUNTER — Ambulatory Visit: Payer: Medicaid Other | Admitting: Gastroenterology

## 2019-10-09 ENCOUNTER — Encounter: Payer: Self-pay | Admitting: Family Medicine

## 2019-10-09 ENCOUNTER — Ambulatory Visit (INDEPENDENT_AMBULATORY_CARE_PROVIDER_SITE_OTHER): Payer: Medicaid Other | Admitting: Family Medicine

## 2019-10-09 ENCOUNTER — Other Ambulatory Visit: Payer: Self-pay

## 2019-10-09 DIAGNOSIS — Z124 Encounter for screening for malignant neoplasm of cervix: Secondary | ICD-10-CM | POA: Diagnosis not present

## 2019-10-09 DIAGNOSIS — F209 Schizophrenia, unspecified: Secondary | ICD-10-CM | POA: Diagnosis not present

## 2019-10-09 DIAGNOSIS — A048 Other specified bacterial intestinal infections: Secondary | ICD-10-CM | POA: Diagnosis present

## 2019-10-09 NOTE — Assessment & Plan Note (Signed)
Discussed with patient her negative results from Pap as well as her negative STI testing. -Follow-up Pap in 5 years.

## 2019-10-09 NOTE — Patient Instructions (Signed)
It was nice to see you again today,  Your Pap smear and the other lab test we did the last time you here were all negative.  Because your Pap smear was negative I do not need to repeat it for another 5 years.  Your abdominal pain appears to be better.  There is nothing urgent I need to see you for in the next few months so if you would like to schedule your next appointment with me for 6 months from now that will be fine.  You may have to wait until we get closer to that 6 months before you can call to schedule.  If something comes up in between now and your next appointment, you can always schedule a sooner appointment to talk with me.  Have a great day,  Clemetine Marker, MD

## 2019-10-09 NOTE — Progress Notes (Signed)
° ° °  SUBJECTIVE:   CHIEF COMPLAINT / HPI:   abd pain: Patient currently does not have any nausea, vomiting, abdominal pain.  She states she took the full course of the medication that was prescribed to her last time.  She has not seen the gastroenterologist since her last appointment.  She says she has an appointment with the gastroenterologist on 27 July.  Lab results: Patient wanted to know the lab results from her last visit.  I informed her that the STI and Pap smear test were negative.  She is okay with not having another Pap smear until 5 years from now.  Psych: Patient still seeing Dr. Altamese [place].  She is taking her medication, Risperdal and Zoloft, as prescribed.  She says that things are going "okay" at home.  Nothing specifically she wants to talk about.  She does say that things are better than in the past.  PERTINENT  PMH / PSH: Schizophrenia, H. pylori infection  OBJECTIVE:   BP 112/72    Pulse (!) 107    Ht 5\' 1"  (1.549 m)    Wt 232 lb 9.6 oz (105.5 kg)    LMP 10/03/2019 (Exact Date)    SpO2 99%    BMI 43.95 kg/m   General: Alert.  No acute distress.  Overweight female, appears stated age. CV: Regular rate and rhythm. Pulmonary: Lungs clear to auscultation bilaterally, no wheezes or crackles ABD: Soft, nontender.  Normal bowel sounds. Psych: Patient speech is soft with decreased speech amount.  Eye contact is adequate.  ASSESSMENT/PLAN:   H. pylori infection Patient feels like symptoms have resolved after taking her last course of H. pylori medication.  She says she has a doctor's appointment with the gastroenterologist next month.  Advised her to continue this appointment.  Cervical cancer screening Discussed with patient her negative results from Pap as well as her negative STI testing. -Follow-up Pap in 5 years.  Schizophrenia [facility]) Managed by psychiatrist, Dr. Altamese Lynch.  Compliant with medication.  Patient seems quiet and reserved, but this is her usual state.  She states  things in her life appear to be going better.  Continue to monitor, but psychiatry will manage medications.     Benay Pike, MD [place]

## 2019-10-09 NOTE — Assessment & Plan Note (Signed)
Patient feels like symptoms have resolved after taking her last course of H. pylori medication.  She says she has a doctor's appointment with the gastroenterologist next month.  Advised her to continue this appointment.

## 2019-10-09 NOTE — Assessment & Plan Note (Signed)
Managed by psychiatrist, Dr. Altamese .  Compliant with medication.  Patient seems quiet and reserved, but this is her usual state.  She states things in her life appear to be going better.  Continue to monitor, but psychiatry will manage medications.

## 2019-11-04 ENCOUNTER — Ambulatory Visit (HOSPITAL_COMMUNITY)
Admission: RE | Admit: 2019-11-04 | Discharge: 2019-11-04 | Disposition: A | Discharge status: Home / Self Care | Payer: Medicaid Other | Source: Ambulatory Visit | Attending: Hematology and Oncology | Admitting: Hematology and Oncology

## 2019-11-04 ENCOUNTER — Encounter (HOSPITAL_COMMUNITY): Payer: Self-pay

## 2019-11-04 ENCOUNTER — Other Ambulatory Visit: Payer: Self-pay

## 2019-11-04 DIAGNOSIS — K769 Liver disease, unspecified: Secondary | ICD-10-CM | POA: Diagnosis present

## 2019-11-04 MED ORDER — IOHEXOL 300 MG/ML  SOLN
100.0000 mL | Freq: Once | INTRAMUSCULAR | Status: AC | PRN
  Administered 2019-11-04: 100 mL via INTRAVENOUS

## 2019-11-04 MED ORDER — SODIUM CHLORIDE (PF) 0.9 % IJ SOLN
INTRAMUSCULAR | Status: AC
  Filled 2019-11-04: qty 50

## 2019-11-06 ENCOUNTER — Inpatient Hospital Stay: Payer: Medicaid Other | Attending: Hematology and Oncology | Admitting: Hematology and Oncology

## 2019-11-06 ENCOUNTER — Other Ambulatory Visit: Payer: Self-pay | Admitting: Hematology and Oncology

## 2019-11-06 ENCOUNTER — Inpatient Hospital Stay: Payer: Medicaid Other

## 2019-11-06 DIAGNOSIS — R160 Hepatomegaly, not elsewhere classified: Secondary | ICD-10-CM

## 2019-11-08 ENCOUNTER — Telehealth: Payer: Self-pay | Admitting: *Deleted

## 2019-11-08 NOTE — Telephone Encounter (Signed)
TCT patient regarding her CT scan results. No answer but was able to leave a voicemail on an identified phone #. Message for pt that her CT scan was normal-no abnormalities that are cause for concern. Advised pt that she could call back @ [phone] with any questions or concerns. Advised she did not need to follow up in this clinic at this time.

## 2019-11-08 NOTE — Telephone Encounter (Signed)
-----   Message from Orson Slick, MD sent at 11/06/2019  5:17 PM EDT ----- Please let Anna Moses know that her CT scan did not show any evidence of the spots we saw previously or other concerning abnormalities. We do not need to continue following her in clinic. We are happy to see her back if she has any new issues or concerns.  ----- Message ----- From: Interface, Rad Results In Sent: 11/04/2019   8:53 AM EDT To: Orson Slick, MD

## 2019-12-06 ENCOUNTER — Ambulatory Visit: Payer: Medicaid Other | Admitting: Gastroenterology

## 2019-12-23 ENCOUNTER — Other Ambulatory Visit: Payer: Self-pay

## 2019-12-23 ENCOUNTER — Ambulatory Visit (INDEPENDENT_AMBULATORY_CARE_PROVIDER_SITE_OTHER): Payer: Medicaid Other

## 2019-12-23 DIAGNOSIS — Z3042 Encounter for surveillance of injectable contraceptive: Secondary | ICD-10-CM | POA: Diagnosis present

## 2019-12-23 LAB — POCT URINE PREGNANCY: Preg Test, Ur: NEGATIVE

## 2019-12-23 MED ORDER — MEDROXYPROGESTERONE ACETATE 150 MG/ML IM SUSP
150.0000 mg | Freq: Once | INTRAMUSCULAR | Status: AC
  Administered 2019-12-23: 150 mg via INTRAMUSCULAR

## 2019-12-23 NOTE — Progress Notes (Signed)
Patient here today for Depo Provera injection and is not within her dates.    Urine pregnancy negative. Patient advised to refrain from [place] for 7 days. Patient to return to nurse clinic for repeat urine pregnancy. Patient to make apt on her way out.   Last contraceptive appt was 09/18/2019.  Depo given in [place] today. Site unremarkable & patient tolerated injection.    Next injection due 03/09/2020-03/23/2020. Reminder card given.

## 2020-01-15 ENCOUNTER — Encounter: Payer: Self-pay | Admitting: Family Medicine

## 2020-01-15 ENCOUNTER — Ambulatory Visit (INDEPENDENT_AMBULATORY_CARE_PROVIDER_SITE_OTHER): Payer: Medicaid Other | Admitting: Family Medicine

## 2020-01-15 ENCOUNTER — Other Ambulatory Visit: Payer: Self-pay

## 2020-01-15 ENCOUNTER — Other Ambulatory Visit (HOSPITAL_COMMUNITY)
Admission: RE | Admit: 2020-01-15 | Discharge: 2020-01-15 | Disposition: A | Discharge status: Home / Self Care | Payer: Medicaid Other | Source: Ambulatory Visit | Attending: Family Medicine | Admitting: Family Medicine

## 2020-01-15 VITALS — BP 120/74 | HR 92 | Ht 61.0 in | Wt 240.0 lb

## 2020-01-15 DIAGNOSIS — Z113 Encounter for screening for infections with a predominantly sexual mode of transmission: Secondary | ICD-10-CM | POA: Diagnosis not present

## 2020-01-15 DIAGNOSIS — B373 Candidiasis of vulva and vagina: Secondary | ICD-10-CM | POA: Diagnosis not present

## 2020-01-15 DIAGNOSIS — B3731 Acute candidiasis of vulva and vagina: Secondary | ICD-10-CM

## 2020-01-15 DIAGNOSIS — N760 Acute vaginitis: Secondary | ICD-10-CM

## 2020-01-15 DIAGNOSIS — B9689 Other specified bacterial agents as the cause of diseases classified elsewhere: Secondary | ICD-10-CM | POA: Diagnosis not present

## 2020-01-15 DIAGNOSIS — Z23 Encounter for immunization: Secondary | ICD-10-CM

## 2020-01-15 DIAGNOSIS — F32A Depression, unspecified: Secondary | ICD-10-CM

## 2020-01-15 DIAGNOSIS — F209 Schizophrenia, unspecified: Secondary | ICD-10-CM

## 2020-01-15 LAB — POCT WET PREP (WET MOUNT)
Clue Cells Wet Prep Whiff POC: POSITIVE
Trichomonas Wet Prep HPF POC: ABSENT

## 2020-01-15 NOTE — Patient Instructions (Addendum)
It was great to see you!  I will call you with your test results.  Take care and seek immediate care sooner if you develop any concerns.   Dr. [facility]   Therapy and Counseling Resources Most providers on this list will take Medicaid.   Best[facility]:Psychiatry and Counseling [address] [facility] [place]. [place], [place] [address] [place]  [address] [place], [place] [address]      [place] [address]  [place], [place] [address] [phone]  [place] [address] [place]  [place],  [address]       [phone]      Jinny Blossom [facility] [address], [place], [place]  Family Solutions:  [place]. [place] [phone]  [facility]:  [place] [address] [place] [phone]  [facility] (under & uninsured) [address] [place] [phone]    [email or web address]    [place] [address] . [place]    [phone]  [facility] [place]     Phone:  [phone]     [place]  [place] [address]      [place], [place] ext [place]: [place], [place], [place]   [place] ([place] therapist) [address]   [place] [address]    [phone]    [facility]   [place],  [place], [place]   [place]  [place]  [facility]  [address] [place], [place]        [phone]  Open Access/Walk In Clinic under & uninsured  [facility]  [address] [place], [place]  [place] [facility] [phone]  [facility] the [place],  ([place])   [place], [place]: [phone]) 8:30 - 12; 1 - 2:30  Family Service of the [place],  [place], [place]    ([phone]):8:30 - 12; 2 - 3PM  [facility] [employer],  [address],  [place]; [phone]):   Mon - Fri 8 AM - 5 PM  [facility] [place]  MWF 12:30 to 3:00 or call to schedule an appointment  [phone]  Specific Provider options Psychology Today  [email or web address].com/us 1. click on find a therapist  2. enter your zip code 3. left side and select or tailor a therapist for your specific need.   [facility] Provider Directory [email or web address]  (Medicaid)   Follow all drop down to find a provider  [place] [phone] or [email or web address] [address], [place], [place] Recovery support and educational   24- Hour Availability:  .  Marland Kitchen [facility]  . [place], [place] [facility] [phone]  . Family Service of the [employer] [phone]  [facility] Crisis Service  [phone]   . [place]  [phone] (after hours)  . Therapeutic Alternative/Mobile Crisis   [phone]  . [place] National Suicide Hotline  2677724473 ([place])  .  Call 911 or go to emergency room  . [employer]  [phone]);  [place] and [employer]   . [facility] ACCESS  [phone]); [place], [place], [place], [place], [place], [place], [place]

## 2020-01-15 NOTE — Progress Notes (Signed)
    SUBJECTIVE:   CHIEF COMPLAINT / HPI:   STI testing Patient has been sexually active with a new female partner over the past 1 month. States this new partner has other sexual partners and she is unsure if he has a history of genital herpes. They have used a condom every time, although it broke once. Patient is s/p bilateral tubal ligation. She also receives depo shots for control of her menstruation. Last depo received on 12/23/2019. Patient has no symptoms. She denies vaginal itching, pain or discharge. No dysuria, urgency or frequency. Patient has not noticed any lesions or other abnormalities. She has no prior history of STIs.   Of note, patient had routine STI testing on 09/18/2019, at which time HIV and RPR were negative. PAP was normal. Negative for gonorrhea, chlamydia, and trichomonas.  Psych-hx of Schizophrenia and Depression Patient's PHQ-9 score is 8 today, with a positive response on question #9. On further discussion, patient notes she hasn't seen her psychiatrist in 2 months because they no longer accept her insurance. She was previously seeing them 2x/month. Patient feels like she's "doing good" from a psychiatry standpoint since restarting her medications ~6 months ago. Reports excellent compliance with her Risperidone and Zoloft over past 6 months.   Patient denies active SI and has no plan. Reports sometimes she "feels like it would be better if she weren't here", but no thoughts of harming herself in any way. No active SI in several years. Patient states she "knows and will tell her Mom to bring her to the hospital if she's in a bad place". Denies auditory or visual hallucinations. No delusions.  Of note, patient has a hx of prior suicide attempts x2, first in her late 53s and then again in early 33s. Both times tried to OD on prescribed psych meds (she's unsure of the name). She has a history of prior psych hospitalizations, most recently in 2011. Stressors: Patient feels  responsible for mom, brother, daughter, grandaughter, and 2 son-in-laws (all of whom live with her). Son recently incarcerated. Protective factors: Patient states her grandkids keep her going. She wants to see them grow up (currently ages 17mo, 2, 4 and 6).   PERTINENT  PMH / PSH: schizophrenia, H. Pylori  OBJECTIVE:   BP 120/74   Pulse 92   Ht 5\' 1"  (1.549 m)   Wt 240 lb (108.9 kg)   SpO2 99%   BMI 45.35 kg/m   Gen: alert, well-appearing, NAD Abd: soft, nontender, nondistended GU: normal external genitalia, no lesions noted. Cervix normal in appearance without lesions. Nonfriable. No blood or discharge in vaginal canal. Psych: appropriately groomed, somewhat flat affect, normal speech, normal thought content, no hallucinations or delusions  ASSESSMENT/PLAN:   Routine screening for STI (sexually transmitted infection) No evidence of active HSV on exam. Will obtain HIV, RPR, gonorrhea, chlamydia, and wet mount to check for BV, yeast and trichomonas. Will call patient with results.   Schizophrenia ([place]), Depression Stable on Risperdal and Zoloft. No active SI. I do not feel patient requires immediate psychiatric care. -Continue Risperdal and Zoloft -Provided psych resources in AVS  Discussed case with Dr. Franky Macho, [place]

## 2020-01-15 NOTE — Assessment & Plan Note (Addendum)
No evidence of active HSV on exam. Will obtain HIV, RPR, gonorrhea, chlamydia, and wet mount to check for BV, yeast and trichomonas. Will call patient with results.

## 2020-01-15 NOTE — Assessment & Plan Note (Addendum)
Stable on Risperdal and Zoloft. No active SI. I do not feel patient requires immediate psychiatric care. -Continue Risperdal and Zoloft -Provided psych resources in AVS

## 2020-01-16 LAB — HIV ANTIBODY (ROUTINE TESTING W REFLEX): HIV Screen 4th Generation wRfx: NONREACTIVE

## 2020-01-16 LAB — SYPHILIS: RPR W/REFLEX TO RPR TITER AND TREPONEMAL ANTIBODIES, TRADITIONAL SCREENING AND DIAGNOSIS ALGORITHM: RPR Ser Ql: NONREACTIVE

## 2020-01-16 LAB — CERVICOVAGINAL ANCILLARY ONLY
Bacterial Vaginitis (gardnerella): POSITIVE — AB
Chlamydia: NEGATIVE
Comment: NEGATIVE
Comment: NEGATIVE
Comment: NEGATIVE
Comment: NORMAL
Neisseria Gonorrhea: NEGATIVE
Trichomonas: NEGATIVE

## 2020-01-20 MED ORDER — FLUCONAZOLE 150 MG PO TABS: 150.0000 mg | ORAL_TABLET | Freq: Once | ORAL | 0 refills | Status: AC

## 2020-01-20 MED ORDER — METRONIDAZOLE 500 MG PO TABS: 500.0000 mg | ORAL_TABLET | Freq: Two times a day (BID) | ORAL | 0 refills | Status: AC

## 2020-01-20 NOTE — Addendum Note (Signed)
Addended by: Alcus Dad on: 01/20/2020 10:49 AM   Modules accepted: Orders

## 2020-03-13 ENCOUNTER — Ambulatory Visit (INDEPENDENT_AMBULATORY_CARE_PROVIDER_SITE_OTHER): Payer: Medicaid Other | Admitting: *Deleted

## 2020-03-13 ENCOUNTER — Other Ambulatory Visit: Payer: Self-pay

## 2020-03-13 DIAGNOSIS — Z3042 Encounter for surveillance of injectable contraceptive: Secondary | ICD-10-CM | POA: Diagnosis not present

## 2020-03-13 MED ORDER — MEDROXYPROGESTERONE ACETATE 150 MG/ML IM SUSY
150.0000 mg | PREFILLED_SYRINGE | Freq: Once | INTRAMUSCULAR | Status: AC
Start: 2020-03-13 — End: 2020-03-13
  Administered 2020-03-13: 150 mg via INTRAMUSCULAR

## 2020-03-13 NOTE — Progress Notes (Signed)
Patient here today for Depo Provera injection and is within her dates.    Last contraceptive appt was 09/18/19  Depo given in [place] today.  Site unremarkable & patient tolerated injection.    Next injection due Feb 18 - June 12, 2020.  Reminder card given.    Christen Bame, CMA

## 2020-06-03 ENCOUNTER — Ambulatory Visit: Payer: Medicaid Other

## 2020-06-04 ENCOUNTER — Other Ambulatory Visit: Payer: Self-pay

## 2020-06-04 ENCOUNTER — Ambulatory Visit (INDEPENDENT_AMBULATORY_CARE_PROVIDER_SITE_OTHER): Payer: Medicaid Other

## 2020-06-04 DIAGNOSIS — Z3042 Encounter for surveillance of injectable contraceptive: Secondary | ICD-10-CM

## 2020-06-04 MED ORDER — MEDROXYPROGESTERONE ACETATE 150 MG/ML IM SUSP
150.0000 mg | Freq: Once | INTRAMUSCULAR | Status: AC
  Administered 2020-06-04: 150 mg via INTRAMUSCULAR

## 2020-06-04 NOTE — Progress Notes (Signed)
Patient here today for Depo Provera injection and is within her dates.    Last contraceptive appt was 09/18/19.  Depo given in [place] today. Site unremarkable & patient tolerated injection.    Next injection due 08/20/2020-09/03/2020. Reminder card given.

## 2020-07-22 IMAGING — CT CT ABDOMEN W/ CM
2 of 9 series · 10 of 46 positions shown, 16 images · IV contrast (APPLIED)
Comparison: CT 05/31/2019 and 04/04/2019

CLINICAL DATA: Follow-up liver lesion. Risk for hepatocellular
carcinoma. Patient underwent liver biopsy 06/11/2019 (benign
results).

EXAM:
CT ABDOMEN WITH CONTRAST
TECHNIQUE: Multidetector CT imaging of the abdomen was performed using the
standard protocol following bolus administration of intravenous
contrast. Imaging through the abdomen was performed in the arterial,
portal and delayed phases.
CONTRAST:  100mL OMNIPAQUE IOHEXOL 300 MG/ML  SOLN

[Series 3: coronal arterial · coronal · arterial · 0.56mm/px · 3 of 107 slices shown, 4 images]
[im 27/107  soft-tissue]
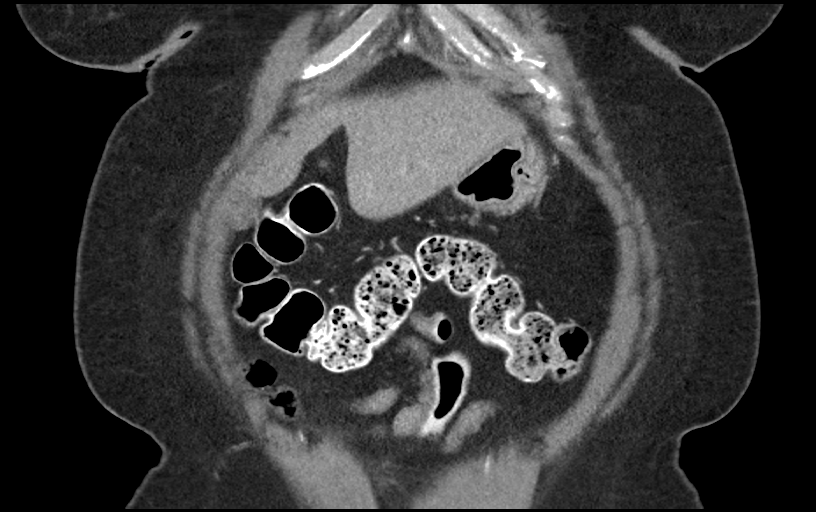
[im 54/107  soft-tissue]
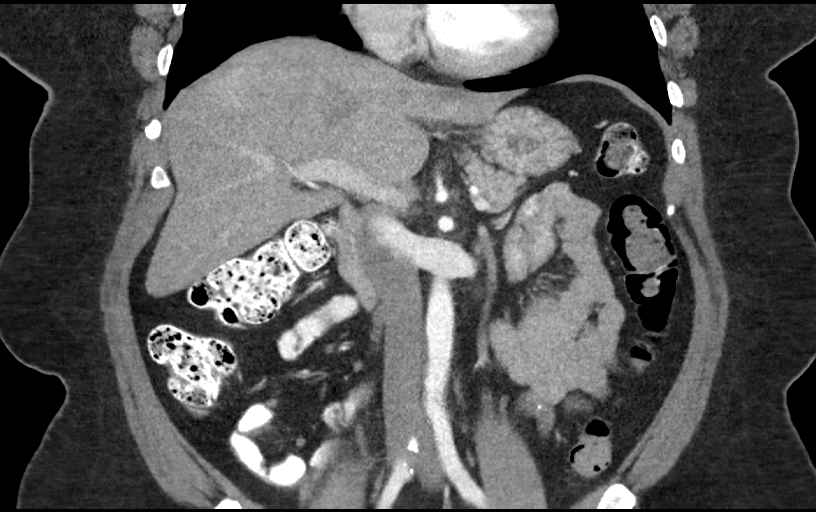
[im 54/107  bone]
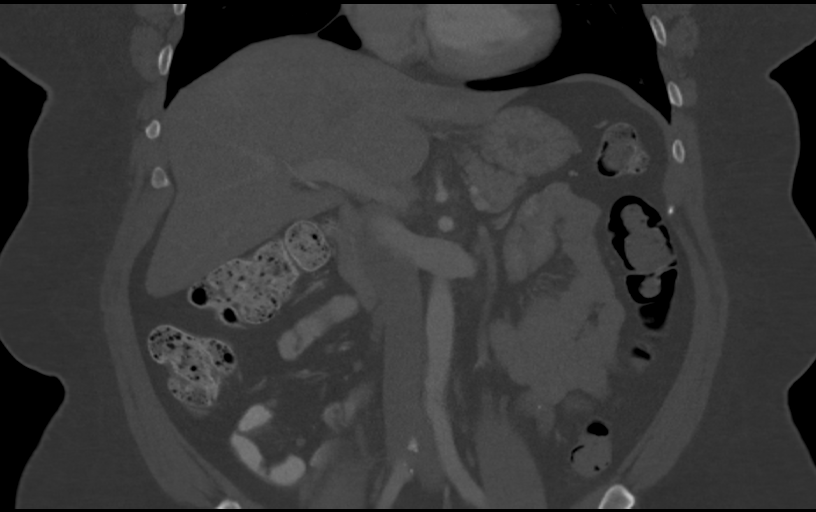
[im 80/107  soft-tissue]
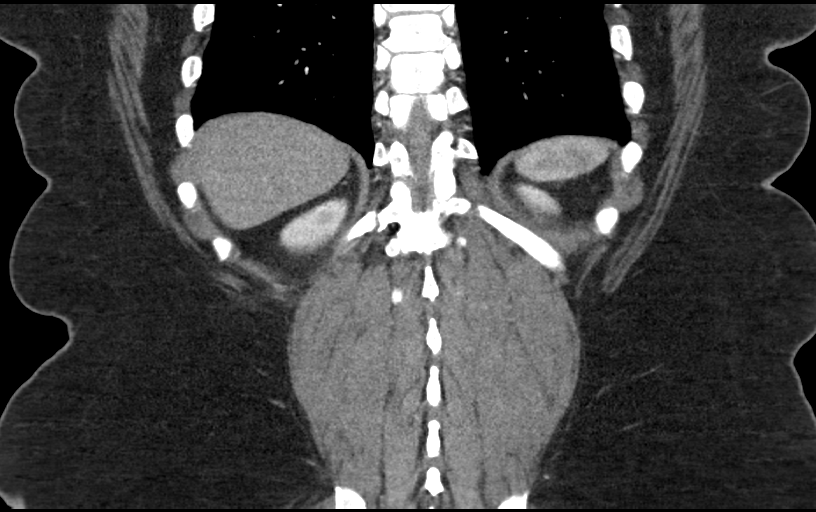

[Series 7: axial venous · axial · portal-venous · 0.92mm/px · z∈[+1195,+1405]mm · 7 of 94 slices shown, 12 images]
[im 12/94  soft-tissue]
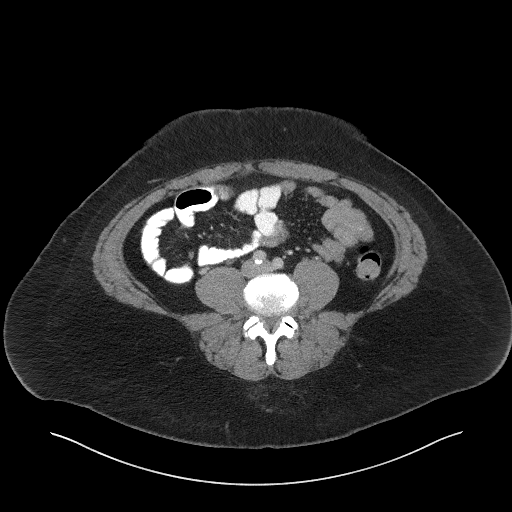
[im 12/94  bone]
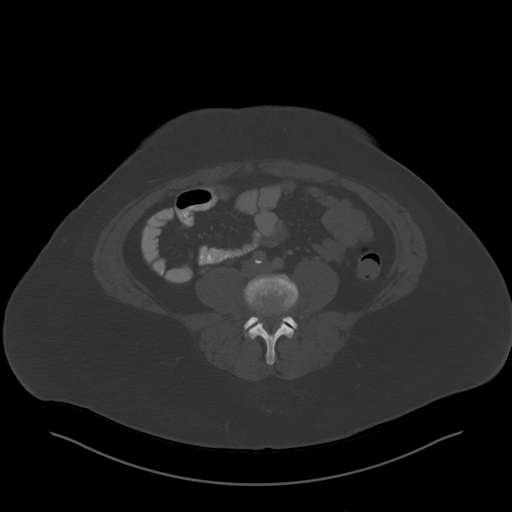
[im 24/94  soft-tissue]
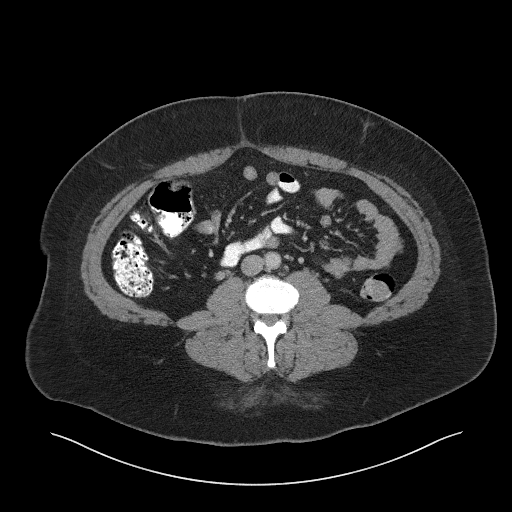
[im 35/94  soft-tissue]
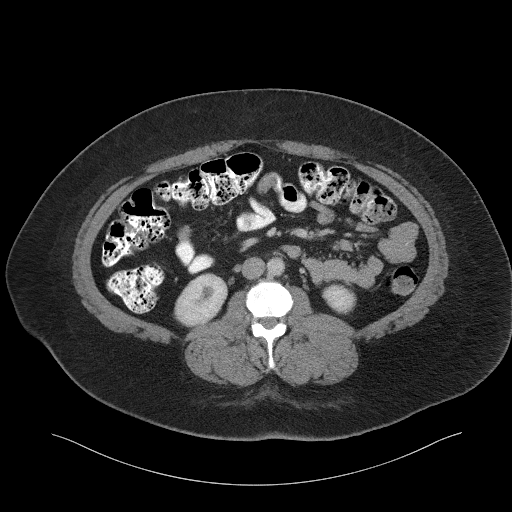
[im 47/94  soft-tissue]
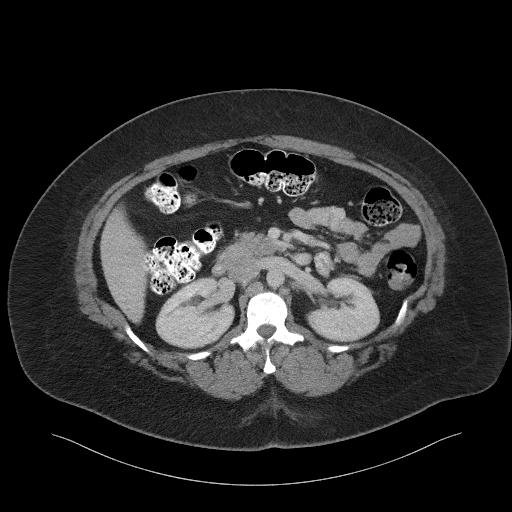
[im 47/94  lung]
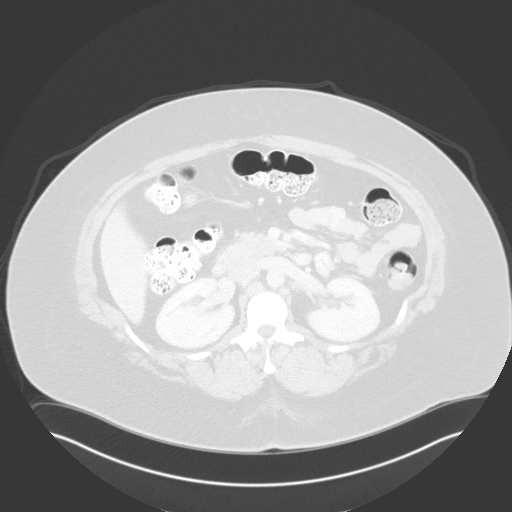
[im 59/94  soft-tissue]
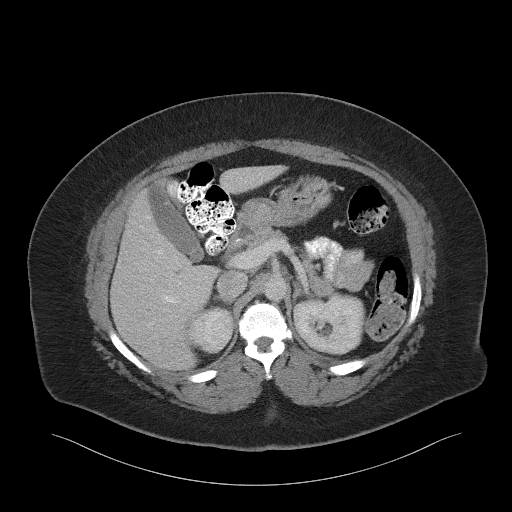
[im 59/94  lung]
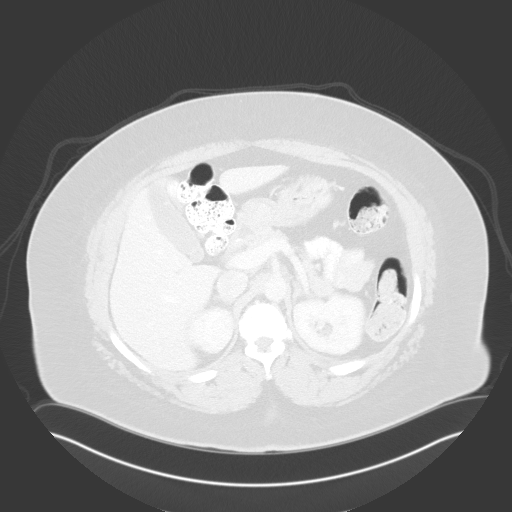
[im 70/94  soft-tissue]
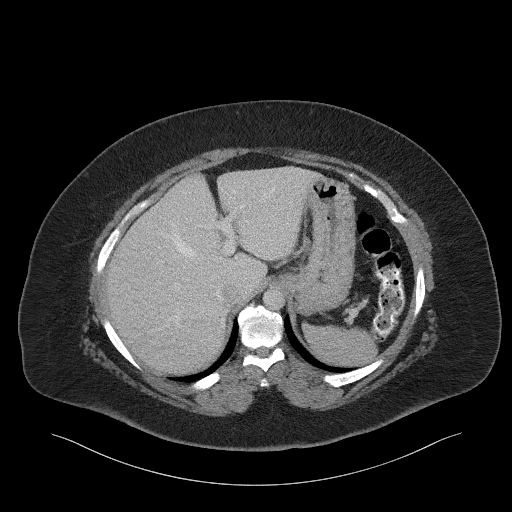
[im 70/94  lung]
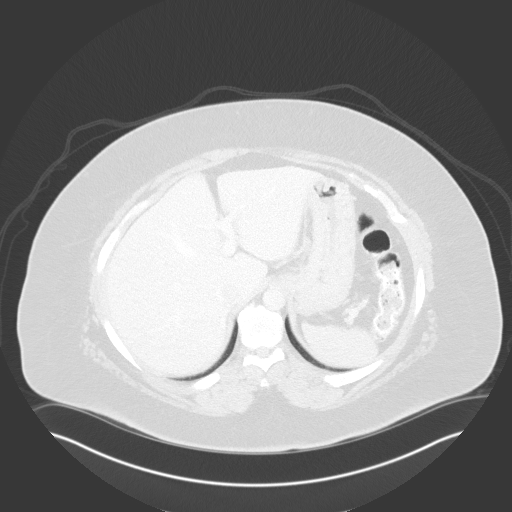
[im 82/94  soft-tissue]
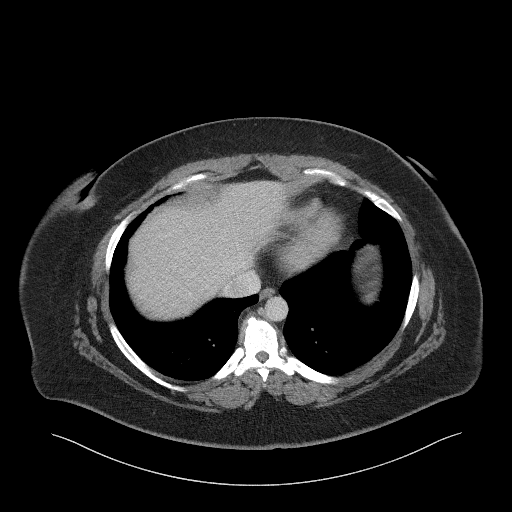
[im 82/94  lung]
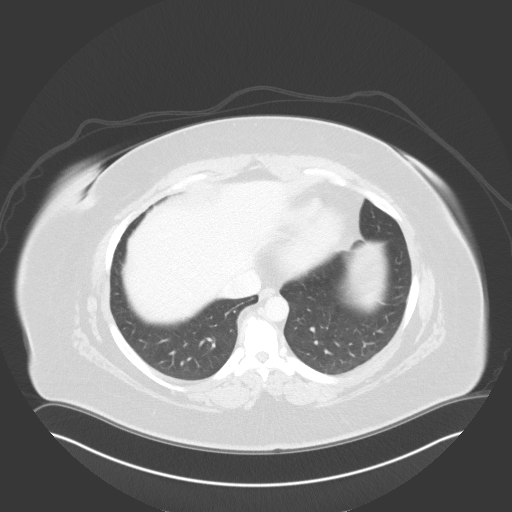

[10 of 46 positions shown; findings below may reference images not displayed]

FINDINGS: Lower chest: Clear lung bases. No significant pleural or pericardial
effusion.

Hepatobiliary: Both previously demonstrated low-density lesions in
the right hepatic lobe have decreased in size compared with the CT
of 05/31/2019. The more superior lesion in segment 7 measures 4 mm
on image [DATE] (previously 12 mm). The more inferior lesion in
segment 6 measures 5 mm on image 34/7 (previously 13 mm). There are
no hypervascular, new or enlarging lesions. No morphologic changes
of cirrhosis. No evidence of gallstones, gallbladder wall thickening
or biliary dilatation.

Pancreas: Unremarkable. No pancreatic ductal dilatation or
surrounding inflammatory changes.

Spleen: Normal in size without focal abnormality.

Adrenals/Urinary Tract: Both adrenal glands appear normal. The
kidneys and ureters appear normal. No evidence of renal mass,
urinary tract calculus or hydronephrosis.

Stomach/Bowel: No evidence of bowel wall thickening, distention or
surrounding inflammatory change.The appendix appears normal.

Vascular/Lymphatic: There are no enlarged abdominal lymph nodes.
Circumaortic left renal vein. Minimal iliac atherosclerosis. No
acute vascular findings.

Other: No evidence of abdominal wall mass or hernia.

Musculoskeletal: No acute or significant osseous findings.
IMPRESSION: 1. Interval decreased size of previously demonstrated low-density
lesions in the right hepatic lobe, consistent with a resolving
benign etiology. No evidence of local recurrence or metastatic
disease. Follow-up as clinically warranted.
2. No acute findings.

## 2020-08-01 ENCOUNTER — Other Ambulatory Visit: Payer: Self-pay

## 2020-08-01 ENCOUNTER — Ambulatory Visit (HOSPITAL_COMMUNITY)
Admission: EM | Admit: 2020-08-01 | Discharge: 2020-08-01 | Disposition: A | Discharge status: Home / Self Care | Payer: Medicaid Other

## 2020-08-01 ENCOUNTER — Encounter (HOSPITAL_COMMUNITY): Payer: Self-pay

## 2020-08-01 DIAGNOSIS — K0889 Other specified disorders of teeth and supporting structures: Secondary | ICD-10-CM

## 2020-08-01 MED ORDER — AMOXICILLIN-POT CLAVULANATE 875-125 MG PO TABS
1.0000 | ORAL_TABLET | Freq: Two times a day (BID) | ORAL | 0 refills | Status: DC
Start: 2020-08-01 — End: 2021-04-21

## 2020-08-01 MED ORDER — LIDOCAINE VISCOUS HCL 2 % MT SOLN
10.0000 mL | OROMUCOSAL | 0 refills | Status: DC | PRN
Start: 2020-08-01 — End: 2021-09-09

## 2020-08-01 MED ORDER — NAPROXEN 500 MG PO TABS
500.0000 mg | ORAL_TABLET | Freq: Two times a day (BID) | ORAL | 0 refills | Status: DC | PRN
Start: 2020-08-01 — End: 2021-02-10

## 2020-08-01 NOTE — ED Triage Notes (Signed)
Pt presents with dental pain, radiates to left ear x 1 day. States she can;t eat or sleep due to pain.

## 2020-08-01 NOTE — ED Provider Notes (Signed)
[facility]    CSN: [id number] Arrival date & time: 08/01/20  1211      History   Chief Complaint Chief Complaint  Patient presents with  . Dental Pain    HPI Anna Moses is a 45 y.o. female.   Patient presenting today with 1 week history of acute worsening left lower dental pain, swelling that is now radiating up toward her ear.  She denies fever, chills, drainage, difficulty swallowing.  Has a dentist appointment next week but could not wait that long.  Has been taking over-the-counter pain relievers with no benefit.     Past Medical History:  Diagnosis Date  . Asthma   . Bronchitis   . Depression   . Obesity   . Schizophrenia [facility])     Patient Active Problem List   Diagnosis Date Noted  . Cervical cancer screening 09/18/2019  . Routine screening for STI (sexually transmitted infection) 09/18/2019  . Cyclical vomiting 50/12/3816  . Schizophrenia ([place]) 07/24/2019  . H. pylori infection 07/24/2019  . Seasonal allergies 07/24/2019  . Dysphagia 06/03/2019  . Nausea and vomiting 06/03/2019  . Liver lesion 06/03/2019  . Preop examination 06/03/2019    Past Surgical History:  Procedure Laterality Date  . CESAREAN SECTION    . COLONOSCOPY  06/27/2019  . UPPER GASTROINTESTINAL ENDOSCOPY  06/27/2019    OB History   No obstetric history on file.      Home Medications    Prior to Admission medications   Medication Sig Start Date End Date Taking? Authorizing Provider  amoxicillin-clavulanate (AUGMENTIN) 875-125 MG tablet Take 1 tablet by mouth every 12 (twelve) hours. 08/01/20  Yes Volney American, PA-C  lidocaine (XYLOCAINE) 2 % solution Use as directed 10 mLs in the mouth or throat as needed for mouth pain. 08/01/20  Yes Volney American, PA-C  medroxyPROGESTERone (DEPO-PROVERA) 150 MG/ML injection Inject 150 mg into the muscle every 3 (three) months.   Yes [provider]  naproxen (NAPROSYN) 500 MG tablet Take 1 tablet  (500 mg total) by mouth 2 (two) times daily as needed. 08/01/20  Yes Volney American, PA-C  dicyclomine (BENTYL) 20 MG tablet Take 1 tablet (20 mg total) by mouth 2 (two) times daily. 09/01/19   Alroy Bailiff, [place], PA-C  loratadine (CLARITIN REDITABS) 10 MG dissolvable tablet Take 1 tablet (10 mg total) by mouth daily. As needed for allergy symptoms Patient not taking: No sig reported 08/20/19   Benay Pike, MD  ondansetron (ZOFRAN ODT) 4 MG disintegrating tablet Take 1 tablet (4 mg total) by mouth every 8 (eight) hours as needed for nausea or vomiting. Patient not taking: No sig reported 09/01/19   Eustaquio Maize, PA-C  pantoprazole (PROTONIX) 40 MG tablet Take 1 tablet (40 mg total) by mouth 2 (two) times daily for 14 days. 09/18/19 10/02/19  Benay Pike, MD  promethazine (PHENERGAN) 25 MG tablet Take 1 tablet (25 mg total) by mouth every 6 (six) hours as needed for nausea or vomiting. Patient not taking: No sig reported 09/03/19   Loura Halt A, NP  risperiDONE (RISPERDAL) 1 MG tablet Take 1 mg by mouth at bedtime.    [provider]  sertraline (ZOLOFT) 50 MG tablet Take 50 mg by mouth daily.    [provider]  sucralfate (CARAFATE) 1 g tablet Take 1 tablet (1 g total) by mouth 4 (four) times daily -  with meals and at bedtime. Patient not taking: No sig reported 09/03/19  Loura Halt A, NP  diphenhydrAMINE (BENADRYL) 25 MG tablet Take 1 tablet (25 mg total) by mouth every 6 (six) hours. Patient not taking: Reported on 12/12/2018 10/21/17 05/30/19  Charlesetta Shanks, MD  famotidine (PEPCID) 20 MG tablet Take 1 tablet (20 mg total) by mouth 2 (two) times daily. 04/04/19 05/30/19  Isla Pence, MD  Ibuprofen-diphenhydrAMINE Cit (ADVIL PM) 200-38 MG TABS Take 2 tablets by mouth 2 (two) times daily as needed (headaches).  05/30/19  [provider]  metoCLOPramide (REGLAN) 10 MG tablet Take 1 tablet (10 mg total) by mouth every 6 (six) hours as needed for nausea  (nausea/headache). 12/12/18 05/30/19  Antonietta Breach, PA-C    Family History Family History  Problem Relation Age of Onset  . Crohn's disease Mother   . Liver disease Father   . Clotting disorder Father   . Liver disease Brother   . Colon cancer Neg Hx   . Esophageal cancer Neg Hx   . Rectal cancer Neg Hx   . Stomach cancer Neg Hx     Social History Social History   Tobacco Use  . Smoking status: Current Every Day Smoker    Types: Cigarettes  . Smokeless tobacco: Never Used  Vaping Use  . Vaping Use: Never used  Substance Use Topics  . Alcohol use: No  . Drug use: Not Currently    Types: Marijuana     Allergies   Patient has no known allergies.   Review of Systems Review of Systems Per HPI Physical Exam Triage Vital Signs ED Triage Vitals  Enc Vitals Group     BP 08/01/20 1306 (!) 143/90     Pulse Rate 08/01/20 1306 78     Resp 08/01/20 1306 20     Temp 08/01/20 1306 98.6 F (37 C)     Temp Source 08/01/20 1306 Oral     SpO2 08/01/20 1306 98 %     Weight --      Height --      Head Circumference --      Peak Flow --      Pain Score 08/01/20 1304 10     Pain Loc --      Pain Edu? --      Excl. in [place]? --    No data found.  Updated Vital Signs BP (!) 143/90 (BP Location: Right Wrist)   Pulse 78   Temp 98.6 F (37 C) (Oral)   Resp 20   SpO2 98%   Visual Acuity Right Eye Distance:   Left Eye Distance:   Bilateral Distance:    Right Eye Near:   Left Eye Near:    Bilateral Near:     Physical Exam Vitals and nursing note reviewed.  Constitutional:      Appearance: Normal appearance. She is not ill-appearing.  HENT:     Head: Atraumatic.     Right Ear: Tympanic membrane normal.     Left Ear: Tympanic membrane normal.     Nose: Nose normal.     Mouth/Throat:     Mouth: Mucous membranes are moist.     Pharynx: No posterior oropharyngeal erythema.     Comments: Gingival erythema, poor dentition left lower jaw Eyes:     Extraocular Movements:  Extraocular movements intact.     Conjunctiva/sclera: Conjunctivae normal.  Cardiovascular:     Rate and Rhythm: Normal rate and regular rhythm.     Heart sounds: Normal heart sounds.  Pulmonary:     Effort:  Pulmonary effort is normal.     Breath sounds: Normal breath sounds.  Abdominal:     General: Bowel sounds are normal. There is no distension.     Palpations: Abdomen is soft.     Tenderness: There is no abdominal tenderness. There is no guarding.  Musculoskeletal:        General: Normal range of motion.     Cervical back: Normal range of motion and neck supple.  Lymphadenopathy:     Cervical: No cervical adenopathy.  Skin:    General: Skin is warm and dry.  Neurological:     Mental Status: She is alert and oriented to person, place, and time.  Psychiatric:        Mood and Affect: Mood normal.        Thought Content: Thought content normal.        Judgment: Judgment normal.      UC Treatments / Results  Labs (all labs ordered are listed, but only abnormal results are displayed) Labs Reviewed - No data to display  EKG   Radiology No results found.  Procedures Procedures (including critical care time)  Medications Ordered in UC Medications - No data to display  Initial Impression / Assessment and Plan / UC Course  I have reviewed the triage vital signs and the nursing notes.  Pertinent labs & imaging results that were available during my care of the patient were reviewed by me and considered in my medical decision making (see chart for details).     We will treat with Augmentin, viscous lidocaine, naproxen.  Salt water gargles additionally as needed.  Follow-up with dentist as scheduled.  Final Clinical Impressions(s) / UC Diagnoses   Final diagnoses:  Pain, dental   Discharge Instructions   None    ED Prescriptions    Medication Sig Dispense Auth. Provider   amoxicillin-clavulanate (AUGMENTIN) 875-125 MG tablet Take 1 tablet by mouth every 12  (twelve) hours. 14 tablet Volney American, [place]   lidocaine (XYLOCAINE) 2 % solution Use as directed 10 mLs in the mouth or throat as needed for mouth pain. 100 mL Volney American, PA-C   naproxen (NAPROSYN) 500 MG tablet Take 1 tablet (500 mg total) by mouth 2 (two) times daily as needed. 30 tablet Volney American, [place]     PDMP not reviewed this encounter.   Volney American, [place] 08/01/20 1354

## 2020-08-13 ENCOUNTER — Ambulatory Visit: Payer: Medicaid Other

## 2020-08-14 ENCOUNTER — Encounter: Payer: Self-pay | Admitting: Family Medicine

## 2020-08-14 ENCOUNTER — Ambulatory Visit: Payer: Medicaid Other

## 2020-08-14 ENCOUNTER — Other Ambulatory Visit: Payer: Self-pay

## 2020-08-14 ENCOUNTER — Ambulatory Visit (INDEPENDENT_AMBULATORY_CARE_PROVIDER_SITE_OTHER): Payer: Medicaid Other | Admitting: Family Medicine

## 2020-08-14 ENCOUNTER — Ambulatory Visit: Payer: Medicaid Other | Admitting: Family Medicine

## 2020-08-14 VITALS — BP 130/84 | Ht 61.0 in | Wt 244.8 lb

## 2020-08-14 DIAGNOSIS — M25462 Effusion, left knee: Secondary | ICD-10-CM | POA: Diagnosis not present

## 2020-08-14 DIAGNOSIS — M25562 Pain in left knee: Secondary | ICD-10-CM

## 2020-08-14 MED ORDER — METHYLPREDNISOLONE ACETATE 40 MG/ML IJ SUSP
40.0000 mg | Freq: Once | INTRAMUSCULAR | Status: AC
  Administered 2020-08-14: 40 mg via INTRA_ARTICULAR

## 2020-08-14 NOTE — Patient Instructions (Signed)
You had an injection today. Things to be aware of after injection are listed below:   . You may experience no significant improvement or even a slight worsening in your symptoms during the first 24 to 48 hours. After that we expect your symptoms to improve gradually over the next 2 weeks for the medicine to have its maximal effect. You should continue to have improvement out to 6 weeks after your injection. . We recommend icing the site of the injection for 20 minutes 1-2 times the day of your injection and as needed for pain over the following several days. . You may shower but no swimming, tub bath or Jacuzzi for 24 hours. . If your bandage falls off this does not need to be replaced. It is appropriate to remove the bandage after 4 hours. . You may resume light activities as tolerated.  It can take several weeks to see improvements following injection. If after 2 weeks you are continuing to have worsening symptoms, please call our office to discuss what the next appropriate actions should be including the potential for a return office visit or other diagnostic testing.  POSSIBLE PROCEDURE SIDE EFFECTS: The side effects of the injection are usually minimal, self-limited, and usually will resolve on their own. Common side effects that can occur over the first several days following injection include:  . Increased numbness or tingling . Worsening pain, stiffness, or slight weakness . Swelling or bruising at the injection site  If you are concerned, please feel free to contact the office with questions.   Please call our office immediately if you experience any of the following symptoms over the next 2 weeks as these can be signs of infection:  . Fever greater than 100.5F . Significant swelling at the injection site . Significant redness or drainage from the injection site  

## 2020-08-14 NOTE — Progress Notes (Signed)
[facility]: Attending Note: I have reviewed the chart, discussed wit the Sports Medicine Fellow. I agree with assessment and treatment plan as detailed in the Fellow's note.  

## 2020-08-14 NOTE — Patient Instructions (Signed)
It was wonderful to see you today.  Please bring ALL of your medications with you to every visit.   Today we talked about:  - [facility] appointment to walk down now to see if you need further imaging due to concerns of hearing a pop- they may do an ultrasound   Thank you for choosing [facility].   Please call [phone] with any questions about today's appointment.  Please be sure to schedule follow up at the front  desk before you leave today.   Yehuda Savannah, MD  Family Medicine

## 2020-08-14 NOTE — Progress Notes (Signed)
Office Visit Note   Patient: Anna Moses           Date of Birth: 09/02/1975           MRN: [id number] Visit Date: 08/14/2020 Requested by: Benay Pike, MD [address] [place],  [place] [address] PCP: Benay Pike, MD  Subjective: CC: Left knee pain  HPI: 45 year old female with past medical history of left lateral meniscus tear who is presenting to clinic with concerns of several days of left lateral knee pain.  Patient states that her previous meniscal injury was diagnosed several years ago, and her symptoms have been controlled following a steroid injection after her initial diagnosis.  She denies any new injuries or trauma, stating that she was doing some spring cleaning when she started to feel sudden pain in the lateral aspect of her left knee.  She endorses swelling of the knee, and difficulty with full extension or flexion.  She endorses an occasional catching sensation, but states this is not frequent.  No fevers or chills.  She does have a history of surgery to the knee, stating that when she was a small child she needed "to have both my legs broken because my knees were sideways."              ROS:   All other systems were reviewed and are negative.  Objective: Vital Signs: BP 130/84   Ht 5\' 1"  (1.549 m)   Wt 244 lb 12.8 oz (111 kg)   LMP 07/29/2020 (Approximate)   BMI 46.25 kg/m  No flowsheet data found.   No flowsheet data found.  Physical Exam:  General:  Alert and oriented, in no acute distress. Pulm:  Breathing unlabored. Psy:  Normal mood, congruent affect. Skin:  Left knee without bruises, rashes, or erythema. Overlying skin intact.   Left Knee Exam:  General: Antalgic gait, with decreased stance phase on the left leg  Range of motion: Flexion and extension are both limited due to pain.  Palpation: Endorses significant tenderness with palpation of the lateral joint line.  No significant tenderness over the medial joint line or pain with  patellar compression.  No tenderness along patellar tendon.  Supine exam: Moderate effusion, normal patellar mobility.   Ligamentous Exam:  No pain or laxity with anterior/posterior drawer.  No obvious Sag.  No pain or laxity with varus/valgus stress across the knee.   Meniscus:  McMurray with significant pain over the lateral aspect, as well as deep clicking appreciated.  Strength: Hip flexion (L1), Hip Aduction (L2), Knee Extension (L3) are 5/5 Bilaterally Foot Inversion (L4), Dorsiflexion (L5), and Eversion (S1) 5/5 Bilaterally  Sensation: Intact to light touch medial and lateral aspects of lower extremities, and lateral, dorsal, and medial aspects of foot.   Imaging: Extremity Ultrasound-left knee:   Quadricepts tendon visualized in long and short access, fibers intact without obvious defects or evidence of tears.  Moderate effusion within suprapatellar pouch. Patellar Tendon intact, with no intratendinous calcifications or thickening.   Lateral joint space preserved, however the lateral meniscus appears extruded with significant overlying fluid.  Overlying lateral collateral ligament fibers appear intact.   Medial joint space appears well preserved. Medial meniscus appears intact without significant surrounding fluid or extrusions. Overlying medial collateral ligament appears intact.   Impression: Knee effusion with concern for lateral meniscal injury.  Assessment & Plan: 45 year old female presenting to clinic with concerns of acute lateral left knee pain.  History of lateral meniscal injury, which  was previously controlled with steroid injection-last performed several years ago.  Suspect her recent increase in activity due to spring cleaning has caused this previous injury to flare.  Risks and benefits of steroid injection discussed, and patient opted to proceed. -Corticosteroid injection performed as described below, which patient tolerated very well.  Due to moderate effusion,  aspiration was also performed, with approximately 20 cc of clear yellow fluid drained from the knee. -Patient endorsed immediate improvement in her symptoms during the anesthetic phase, her range of motion returned to normal with minimal discomfort. -Strict return precautions and aftercare was discussed. -If no improvement following steroid injection, patient is to return to clinic for reevaluation.  Could consider MRI at that time. -Patient no further questions or concerns today.     Procedures: Left knee aspiration and cortisone Injection:  Risks and benefits of procedure discussed, Patient opted to proceed.  Written consent obtained.  Timeout performed.  Skin prepped in a sterile fashion with betadine before further cleansing with alcohol. Ethyl Chloride was used for topical analgesia.  Left knee was injected with 2cc 1% Lidocaine without epinephrine via the suprapatellar approach.  A superficial skin wheal was created prior to deeper anesthetic injection.  Following lidocaine administration, approximately 20 cc of clear yellow fluid was aspirated from the knee through 18-gauge needle.  After aspiration, syringe was removed from the needle and 3 cc 1% lidocaine without epinephrine combined with 40 mg of methylprednisolone was then injected into the left knee joint.   Patient tolerated the injection well with no immediate complications. Aftercare instructions were discussed, and patient was given strict return precautions.

## 2020-08-15 DIAGNOSIS — M25462 Effusion, left knee: Secondary | ICD-10-CM | POA: Insufficient documentation

## 2020-08-15 NOTE — Assessment & Plan Note (Signed)
-   suspected left knee effusion based on physical exam, called sports med who is able to see her to do an ultrasound and possible intervention today, suspicious for meniscal tear based on physical exam and history - sent straight to sports medicine

## 2020-08-15 NOTE — Progress Notes (Signed)
    SUBJECTIVE:   CHIEF COMPLAINT / HPI:   45 yo F who presents with 3 day onset of knee pain. She was doing spring cleaning three days ago and then noted worsening pain in her left knee, and it started to swell and now it is painful for her to extend it. NO trauma or fall. She notes she twisted something and injured this knee several years ago, got one steroid injection which helped some, and hasn't had many issues with it since. She has tried a brace for the pain, she has not tried any medications for the pain. She soaked in the tub which helped some too. The knee has not been red or warm, she denies fevers or chills. She does hear a "pop" when trying to walk on it. She last had imaging of her knee years ago. No other leg swelling or hip pain, just the knee joint swelling.  PERTINENT  PMH / PSH: schizophrenia, H pylori infection  OBJECTIVE:   BP 108/60   Pulse 76   Ht 5\' 1"  (1.549 m)   Wt 244 lb 12.8 oz (111 kg)   LMP 07/29/2020 (Approximate)   SpO2 98%   BMI 46.25 kg/m   Left knee: pain with extension/flexion of knee, tender to palpation along medial and lateral joint line, + effusion appreciated, + Thessaly's on the left, negative anterior/posterior drawer test, strength and sensation intact in bilateral LE Extremities: no calf or ankle edema, no posterior knee tenderness to palpation  ASSESSMENT/PLAN:   Effusion of left knee - suspected left knee effusion based on physical exam, called sports med who is able to see her to do an ultrasound and possible intervention today, suspicious for meniscal tear based on physical exam and history - sent straight to sports medicine   Pt inquired about her Depo injection- I let her know she is due between 08/20/2020 and 09/03/2020 and she can make nurse only appt to come in for injection during that time.   Lenoria Chime, MD [place]

## 2020-08-21 ENCOUNTER — Other Ambulatory Visit: Payer: Self-pay

## 2020-08-21 ENCOUNTER — Ambulatory Visit (INDEPENDENT_AMBULATORY_CARE_PROVIDER_SITE_OTHER): Payer: Medicaid Other

## 2020-08-21 DIAGNOSIS — Z3042 Encounter for surveillance of injectable contraceptive: Secondary | ICD-10-CM

## 2020-08-21 MED ORDER — MEDROXYPROGESTERONE ACETATE 150 MG/ML IM SUSP
150.0000 mg | Freq: Once | INTRAMUSCULAR | Status: AC
  Administered 2020-08-21: 150 mg via INTRAMUSCULAR

## 2020-08-21 NOTE — Progress Notes (Signed)
Patient here today for Depo Provera injection and is within her dates.    Last contraceptive appt was 09/18/19. Advised patient that she would need to schedule next depo injection with PCP for yearly contraceptive appointment.   Depo given in [place] today.  Site unremarkable & patient tolerated injection.    Next injection due 7/29-8/12.  Reminder card given.    Talbot Grumbling, RN

## 2020-11-07 ENCOUNTER — Encounter (HOSPITAL_COMMUNITY): Payer: Self-pay | Admitting: Emergency Medicine

## 2020-11-07 ENCOUNTER — Emergency Department (HOSPITAL_COMMUNITY)
Admission: EM | Admit: 2020-11-07 | Discharge: 2020-11-07 | Disposition: A | Discharge status: Home / Self Care | Payer: Medicaid Other | Attending: Emergency Medicine | Admitting: Emergency Medicine

## 2020-11-07 DIAGNOSIS — R1084 Generalized abdominal pain: Secondary | ICD-10-CM | POA: Diagnosis not present

## 2020-11-07 DIAGNOSIS — J45909 Unspecified asthma, uncomplicated: Secondary | ICD-10-CM | POA: Diagnosis not present

## 2020-11-07 DIAGNOSIS — R112 Nausea with vomiting, unspecified: Secondary | ICD-10-CM

## 2020-11-07 DIAGNOSIS — F17210 Nicotine dependence, cigarettes, uncomplicated: Secondary | ICD-10-CM | POA: Diagnosis not present

## 2020-11-07 DIAGNOSIS — N9489 Other specified conditions associated with female genital organs and menstrual cycle: Secondary | ICD-10-CM | POA: Insufficient documentation

## 2020-11-07 LAB — CBC WITH DIFFERENTIAL/PLATELET
Abs Immature Granulocytes: 0.03 10*3/uL (ref 0.00–0.07)
Basophils Absolute: 0.1 10*3/uL (ref 0.0–0.1)
Basophils Relative: 1 %
Eosinophils Absolute: 0 10*3/uL (ref 0.0–0.5)
Eosinophils Relative: 0 %
HCT: 41.3 % (ref 36.0–46.0)
Hemoglobin: 13.7 g/dL (ref 12.0–15.0)
Immature Granulocytes: 0 %
Lymphocytes Relative: 38 %
Lymphs Abs: 3.4 10*3/uL (ref 0.7–4.0)
MCH: 29.1 pg (ref 26.0–34.0)
MCHC: 33.2 g/dL (ref 30.0–36.0)
MCV: 87.7 fL (ref 80.0–100.0)
Monocytes Absolute: 0.4 10*3/uL (ref 0.1–1.0)
Monocytes Relative: 5 %
Neutro Abs: 5 10*3/uL (ref 1.7–7.7)
Neutrophils Relative %: 56 %
Platelets: 479 10*3/uL — ABNORMAL HIGH (ref 150–400)
RBC: 4.71 MIL/uL (ref 3.87–5.11)
RDW: 13.8 % (ref 11.5–15.5)
WBC: 9 10*3/uL (ref 4.0–10.5)
nRBC: 0 % (ref 0.0–0.2)

## 2020-11-07 LAB — COMPREHENSIVE METABOLIC PANEL
ALT: 19 U/L (ref 0–44)
AST: 17 U/L (ref 15–41)
Albumin: 3.8 g/dL (ref 3.5–5.0)
Alkaline Phosphatase: 58 U/L (ref 38–126)
Anion gap: 10 (ref 5–15)
BUN: 7 mg/dL (ref 6–20)
CO2: 18 mmol/L — ABNORMAL LOW (ref 22–32)
Calcium: 9.4 mg/dL (ref 8.9–10.3)
Chloride: 109 mmol/L (ref 98–111)
Creatinine, Ser: 0.92 mg/dL (ref 0.44–1.00)
GFR, Estimated: >60 mL/min (ref >60)
Glucose, Bld: 105 mg/dL — ABNORMAL HIGH (ref 70–99)
Potassium: 3.7 mmol/L (ref 3.5–5.1)
Sodium: 137 mmol/L (ref 135–145)
Total Bilirubin: 0.6 mg/dL (ref 0.3–1.2)
Total Protein: 7.6 g/dL (ref 6.5–8.1)

## 2020-11-07 LAB — LIPASE, BLOOD: Lipase: 24 U/L (ref 11–51)

## 2020-11-07 LAB — I-STAT BETA HCG BLOOD, ED (MC, WL, AP ONLY): I-stat hCG, quantitative: <5 m[IU]/mL (ref <5)

## 2020-11-07 MED ORDER — SODIUM CHLORIDE 0.9 % IV BOLUS
1000.0000 mL | Freq: Once | INTRAVENOUS | Status: AC
  Administered 2020-11-07: 1000 mL via INTRAVENOUS

## 2020-11-07 MED ORDER — DIPHENHYDRAMINE HCL 50 MG/ML IJ SOLN
25.0000 mg | Freq: Once | INTRAMUSCULAR | Status: AC
  Administered 2020-11-07: 25 mg via INTRAVENOUS
  Filled 2020-11-07: qty 1

## 2020-11-07 MED ORDER — PROCHLORPERAZINE EDISYLATE 10 MG/2ML IJ SOLN
10.0000 mg | Freq: Once | INTRAMUSCULAR | Status: AC
  Administered 2020-11-07: 10 mg via INTRAVENOUS
  Filled 2020-11-07: qty 2

## 2020-11-07 MED ORDER — ALUM & MAG HYDROXIDE-SIMETH 200-200-20 MG/5ML PO SUSP
30.0000 mL | Freq: Once | ORAL | Status: AC
  Administered 2020-11-07: 30 mL via ORAL
  Filled 2020-11-07: qty 30

## 2020-11-07 MED ORDER — ONDANSETRON 4 MG PO TBDP: ORAL_TABLET | ORAL | 0 refills | Status: DC

## 2020-11-07 NOTE — ED Notes (Signed)
Patient is outside

## 2020-11-07 NOTE — Discharge Instructions (Addendum)
Follow-up with your family doctor in the office.  Try the brat diet bananas rice applesauce and toast this does not sound appealing but is gentle on your stomach.  As you are able to tolerate that this evening then you can slowly try and advance her diet.  Please return for worsening abdominal pain or inability eat or drink.

## 2020-11-07 NOTE — ED Provider Notes (Signed)
[facility] EMERGENCY DEPARTMENT Provider Note   CSN: [id number] Arrival date & time: 11/07/20  1415     History Chief Complaint  Patient presents with   Abdominal Pain    Anna Moses is a 44 y.o. female.  45 yo F with a chief complaints of nausea and vomiting.  Going on for about 3 days now.  No known sick contacts no recent travel no suspicious food intake.  Complaining of some epigastric abdominal pain post vomiting.  Denies diarrhea.  The history is provided by the patient.  Abdominal Pain Pain location:  Generalized Pain severity:  Moderate Onset quality:  Sudden Duration:  3 days Timing:  Constant Progression:  Worsening Chronicity:  New Associated symptoms: nausea and vomiting   Associated symptoms: no chest pain, no chills, no dysuria, no fever and no shortness of breath       Past Medical History:  Diagnosis Date   Asthma    Bronchitis    Depression    Obesity    Schizophrenia [facility])     Patient Active Problem List   Diagnosis Date Noted   Effusion of left knee 08/15/2020   Cervical cancer screening 09/18/2019   Routine screening for STI (sexually transmitted infection) [id number]   Cyclical vomiting [id number]   Schizophrenia ([place]) 07/24/2019   H. pylori infection 07/24/2019   Seasonal allergies 07/24/2019   Dysphagia 06/03/2019   Nausea and vomiting 06/03/2019   Liver lesion 06/03/2019   Preop examination 06/03/2019    Past Surgical History:  Procedure Laterality Date   CESAREAN SECTION     COLONOSCOPY  06/27/2019   UPPER GASTROINTESTINAL ENDOSCOPY  06/27/2019     OB History   No obstetric history on file.     Family History  Problem Relation Age of Onset   Crohn's disease Mother    Liver disease Father    Clotting disorder Father    Liver disease Brother    Colon cancer Neg Hx    Esophageal cancer Neg Hx    Rectal cancer Neg Hx    Stomach cancer Neg Hx     Social History   Tobacco Use   Smoking  status: Every Day    Types: Cigarettes   Smokeless tobacco: Never  Vaping Use   Vaping Use: Never used  Substance Use Topics   Alcohol use: No   Drug use: Not Currently    Types: Marijuana    Home Medications Prior to Admission medications   Medication Sig Start Date End Date Taking? Authorizing Provider  ondansetron (ZOFRAN ODT) 4 MG disintegrating tablet '4mg'$  ODT q4 hours prn nausea/vomit 11/07/20  Yes Deno Etienne, DO  amoxicillin-clavulanate (AUGMENTIN) 875-125 MG tablet Take 1 tablet by mouth every 12 (twelve) hours. 08/01/20   Volney American, PA-C  dicyclomine (BENTYL) 20 MG tablet Take 1 tablet (20 mg total) by mouth 2 (two) times daily. 09/01/19   Alroy Bailiff, [place], PA-C  lidocaine (XYLOCAINE) 2 % solution Use as directed 10 mLs in the mouth or throat as needed for mouth pain. 08/01/20   Volney American, PA-C  loratadine (CLARITIN REDITABS) 10 MG dissolvable tablet Take 1 tablet (10 mg total) by mouth daily. As needed for allergy symptoms Patient not taking: No sig reported 08/20/19   Benay Pike, MD  medroxyPROGESTERone (DEPO-PROVERA) 150 MG/ML injection Inject 150 mg into the muscle every 3 (three) months.    [provider]  naproxen (NAPROSYN) 500 MG tablet Take 1 tablet (500 mg total)  by mouth 2 (two) times daily as needed. 08/01/20   Volney American, PA-C  pantoprazole (PROTONIX) 40 MG tablet Take 1 tablet (40 mg total) by mouth 2 (two) times daily for 14 days. 09/18/19 10/02/19  Benay Pike, MD  promethazine (PHENERGAN) 25 MG tablet Take 1 tablet (25 mg total) by mouth every 6 (six) hours as needed for nausea or vomiting. Patient not taking: No sig reported 09/03/19   Loura Halt A, NP  risperiDONE (RISPERDAL) 1 MG tablet Take 1 mg by mouth at bedtime.    [provider]  sertraline (ZOLOFT) 50 MG tablet Take 50 mg by mouth daily.    [provider]  sucralfate (CARAFATE) 1 g tablet Take 1 tablet (1 g total) by mouth 4 (four) times  daily -  with meals and at bedtime. Patient not taking: No sig reported 09/03/19   Loura Halt A, NP  diphenhydrAMINE (BENADRYL) 25 MG tablet Take 1 tablet (25 mg total) by mouth every 6 (six) hours. Patient not taking: Reported on 12/12/2018 10/21/17 05/30/19  Charlesetta Shanks, MD  famotidine (PEPCID) 20 MG tablet Take 1 tablet (20 mg total) by mouth 2 (two) times daily. 04/04/19 05/30/19  Isla Pence, MD  Ibuprofen-diphenhydrAMINE Cit (ADVIL PM) 200-38 MG TABS Take 2 tablets by mouth 2 (two) times daily as needed (headaches).  05/30/19  [provider]  metoCLOPramide (REGLAN) 10 MG tablet Take 1 tablet (10 mg total) by mouth every 6 (six) hours as needed for nausea (nausea/headache). 12/12/18 05/30/19  Antonietta Breach, PA-C    Allergies    Patient has no known allergies.  Review of Systems   Review of Systems  Constitutional:  Negative for chills and fever.  HENT:  Negative for congestion and rhinorrhea.   Eyes:  Negative for redness and visual disturbance.  Respiratory:  Negative for shortness of breath and wheezing.   Cardiovascular:  Negative for chest pain and palpitations.  Gastrointestinal:  Positive for abdominal pain, nausea and vomiting.  Genitourinary:  Negative for dysuria and urgency.  Musculoskeletal:  Negative for arthralgias and myalgias.  Skin:  Negative for pallor and wound.  Neurological:  Negative for dizziness and headaches.   Physical Exam Updated Vital Signs BP (!) 148/74 (BP Location: Left Arm)   Pulse (!) 58   Temp 98.3 F (36.8 C) (Oral)   Resp 20   SpO2 97%   Physical Exam Vitals and nursing note reviewed.  Constitutional:      General: She is not in acute distress.    Appearance: She is well-developed. She is not diaphoretic.  HENT:     Head: Normocephalic and atraumatic.  Eyes:     Pupils: Pupils are equal, round, and reactive to light.  Cardiovascular:     Rate and Rhythm: Normal rate and regular rhythm.     Heart sounds: No murmur heard.    No friction rub. No gallop.  Pulmonary:     Effort: Pulmonary effort is normal.     Breath sounds: No wheezing or rales.  Abdominal:     General: There is no distension.     Palpations: Abdomen is soft.     Tenderness: There is abdominal tenderness (mild epigastric).  Musculoskeletal:        General: No tenderness.     Cervical back: Normal range of motion and neck supple.  Skin:    General: Skin is warm and dry.  Neurological:     Mental Status: She is alert and oriented to  person, place, and time.  Psychiatric:        Behavior: Behavior normal.    ED Results / Procedures / Treatments   Labs (all labs ordered are listed, but only abnormal results are displayed) Labs Reviewed  CBC WITH DIFFERENTIAL/PLATELET - Abnormal; Notable for the following components:      Result Value   Platelets 479 (*)    All other components within normal limits  COMPREHENSIVE METABOLIC PANEL - Abnormal; Notable for the following components:   CO2 18 (*)    Glucose, Bld 105 (*)    All other components within normal limits  LIPASE, BLOOD  URINALYSIS, ROUTINE W REFLEX MICROSCOPIC  I-STAT BETA HCG BLOOD, ED ([facility], [facility], [facility] ONLY)    EKG None  Radiology No results found.  Procedures Procedures   Medications Ordered in ED Medications  sodium chloride 0.9 % bolus 1,000 mL (0 mLs Intravenous Stopped 11/07/20 1758)  prochlorperazine (COMPAZINE) injection 10 mg (10 mg Intravenous Given 11/07/20 1700)  diphenhydrAMINE (BENADRYL) injection 25 mg (25 mg Intravenous Given 11/07/20 1700)  alum & mag hydroxide-simeth (MAALOX/MYLANTA) 200-200-20 MG/5ML suspension 30 mL (30 mLs Oral Given 11/07/20 1700)    ED Course  I have reviewed the triage vital signs and the nursing notes.  Pertinent labs & imaging results that were available during my care of the patient were reviewed by me and considered in my medical decision making (see chart for details).    MDM Rules/Calculators/A&P                            45 yo F with a chief complaints of nausea vomiting and epigastric abdominal pain.  Going on for 3 days now.  Lab work is unremarkable LFTs and lipase benign.  Patient giving IV fluids and antiemetics with improvement tolerating p.o.  Discharged home.  6:00 PM:  I have discussed the diagnosis/risks/treatment options with the patient and believe the pt to be eligible for discharge home to follow-up with PCP. We also discussed returning to the ED immediately if new or worsening sx occur. We discussed the sx which are most concerning (e.g., sudden worsening pain, fever, inability to tolerate by mouth) that necessitate immediate return. Medications administered to the patient during their visit and any new prescriptions provided to the patient are listed below.  Medications given during this visit Medications  sodium chloride 0.9 % bolus 1,000 mL (0 mLs Intravenous Stopped 11/07/20 1758)  prochlorperazine (COMPAZINE) injection 10 mg (10 mg Intravenous Given 11/07/20 1700)  diphenhydrAMINE (BENADRYL) injection 25 mg (25 mg Intravenous Given 11/07/20 1700)  alum & mag hydroxide-simeth (MAALOX/MYLANTA) 200-200-20 MG/5ML suspension 30 mL (30 mLs Oral Given 11/07/20 1700)     The patient appears reasonably screen and/or stabilized for discharge and I doubt any other medical condition or other [facility] requiring further screening, evaluation, or treatment in the ED at this time prior to discharge.   Final Clinical Impression(s) / ED Diagnoses Final diagnoses:  Nausea and vomiting in adult    Rx / DC Orders ED Discharge Orders          Ordered    ondansetron (ZOFRAN ODT) 4 MG disintegrating tablet        11/07/20 1758             Deno Etienne, DO 11/07/20 1800

## 2020-11-07 NOTE — ED Provider Notes (Signed)
Emergency Medicine Provider Triage Evaluation Note  Anna Moses , a 45 y.o. female  was evaluated in triage.  Pt complains of abd pain, emesis. Multiple episodes of NBNB emesis. Unable to keep down liquids at home. Generalized abd pain. Prior c- section.  Review of Systems  Positive: Abd pain, emesis Negative: Fever, dysuria  Physical Exam  There were no vitals taken for this visit. Gen:   Awake, no distress , active emesis  Resp:  Normal effort  MSK:   Moves extremities without difficulty  ABD:  Diffusely tender Other:    Medical Decision Making  Medically screening exam initiated at 2:20 PM.  Appropriate orders placed.  Anna Moses was informed that the remainder of the evaluation will be completed by another provider, this initial triage assessment does not replace that evaluation, and the importance of remaining in the ED until their evaluation is complete.  Abd pain, emesis   Work up started   [employer], PA-C 11/07/20 1424    Wyvonnia Dusky, MD 11/07/20 1435

## 2020-11-07 NOTE — ED Triage Notes (Signed)
Pt to triage via [facility] from home.  Reports RUQ pain x 3 days with nausea, vomiting, and diarrhea.

## 2020-11-09 ENCOUNTER — Other Ambulatory Visit: Payer: Self-pay

## 2020-11-09 ENCOUNTER — Ambulatory Visit (HOSPITAL_COMMUNITY)
Admission: EM | Admit: 2020-11-09 | Discharge: 2020-11-09 | Disposition: A | Discharge status: Home / Self Care | Payer: Medicaid Other | Attending: Sports Medicine | Admitting: Sports Medicine

## 2020-11-09 ENCOUNTER — Encounter (HOSPITAL_COMMUNITY): Payer: Self-pay | Admitting: *Deleted

## 2020-11-09 DIAGNOSIS — R531 Weakness: Secondary | ICD-10-CM | POA: Diagnosis present

## 2020-11-09 DIAGNOSIS — R197 Diarrhea, unspecified: Secondary | ICD-10-CM | POA: Diagnosis present

## 2020-11-09 DIAGNOSIS — R112 Nausea with vomiting, unspecified: Secondary | ICD-10-CM | POA: Insufficient documentation

## 2020-11-09 DIAGNOSIS — R1013 Epigastric pain: Secondary | ICD-10-CM | POA: Insufficient documentation

## 2020-11-09 LAB — CBC WITH DIFFERENTIAL/PLATELET
Abs Immature Granulocytes: 0.03 10*3/uL (ref 0.00–0.07)
Basophils Absolute: 0.1 10*3/uL (ref 0.0–0.1)
Basophils Relative: 1 %
Eosinophils Absolute: 0.6 10*3/uL — ABNORMAL HIGH (ref 0.0–0.5)
Eosinophils Relative: 6 %
HCT: 40.2 % (ref 36.0–46.0)
Hemoglobin: 13 g/dL (ref 12.0–15.0)
Immature Granulocytes: 0 %
Lymphocytes Relative: 34 %
Lymphs Abs: 3.6 10*3/uL (ref 0.7–4.0)
MCH: 28.6 pg (ref 26.0–34.0)
MCHC: 32.3 g/dL (ref 30.0–36.0)
MCV: 88.4 fL (ref 80.0–100.0)
Monocytes Absolute: 0.8 10*3/uL (ref 0.1–1.0)
Monocytes Relative: 7 %
Neutro Abs: 5.7 10*3/uL (ref 1.7–7.7)
Neutrophils Relative %: 52 %
Platelets: 462 10*3/uL — ABNORMAL HIGH (ref 150–400)
RBC: 4.55 MIL/uL (ref 3.87–5.11)
RDW: 13.6 % (ref 11.5–15.5)
WBC: 10.8 10*3/uL — ABNORMAL HIGH (ref 4.0–10.5)
nRBC: 0 % (ref 0.0–0.2)

## 2020-11-09 LAB — COMPREHENSIVE METABOLIC PANEL
ALT: 22 U/L (ref 0–44)
AST: 31 U/L (ref 15–41)
Albumin: 4 g/dL (ref 3.5–5.0)
Alkaline Phosphatase: 48 U/L (ref 38–126)
Anion gap: 10 (ref 5–15)
BUN: 11 mg/dL (ref 6–20)
CO2: 23 mmol/L (ref 22–32)
Calcium: 9.5 mg/dL (ref 8.9–10.3)
Chloride: 104 mmol/L (ref 98–111)
Creatinine, Ser: 1.01 mg/dL — ABNORMAL HIGH (ref 0.44–1.00)
GFR, Estimated: >60 mL/min (ref >60)
Glucose, Bld: 99 mg/dL (ref 70–99)
Potassium: 3.4 mmol/L — ABNORMAL LOW (ref 3.5–5.1)
Sodium: 137 mmol/L (ref 135–145)
Total Bilirubin: 1 mg/dL (ref 0.3–1.2)
Total Protein: 7.6 g/dL (ref 6.5–8.1)

## 2020-11-09 LAB — LIPASE, BLOOD: Lipase: 27 U/L (ref 11–51)

## 2020-11-09 MED ORDER — ONDANSETRON 4 MG PO TBDP
ORAL_TABLET | ORAL | Status: AC
  Filled 2020-11-09: qty 1

## 2020-11-09 MED ORDER — ALUM & MAG HYDROXIDE-SIMETH 200-200-20 MG/5ML PO SUSP
30.0000 mL | Freq: Once | ORAL | Status: AC
  Administered 2020-11-09: 30 mL via ORAL

## 2020-11-09 MED ORDER — ALUM & MAG HYDROXIDE-SIMETH 200-200-20 MG/5ML PO SUSP
ORAL | Status: AC
  Filled 2020-11-09: qty 30

## 2020-11-09 MED ORDER — ONDANSETRON 4 MG PO TBDP
4.0000 mg | ORAL_TABLET | Freq: Once | ORAL | Status: AC
  Administered 2020-11-09: 4 mg via ORAL

## 2020-11-09 MED ORDER — LIDOCAINE VISCOUS HCL 2 % MT SOLN
15.0000 mL | Freq: Once | OROMUCOSAL | Status: AC
  Administered 2020-11-09: 15 mL via ORAL

## 2020-11-09 MED ORDER — PANTOPRAZOLE SODIUM 40 MG PO TBEC: 40.0000 mg | DELAYED_RELEASE_TABLET | Freq: Two times a day (BID) | ORAL | 0 refills | Status: DC

## 2020-11-09 MED ORDER — LIDOCAINE VISCOUS HCL 2 % MT SOLN
OROMUCOSAL | Status: AC
  Filled 2020-11-09: qty 15

## 2020-11-09 MED ORDER — SUCRALFATE 1 G PO TABS: 1.0000 g | ORAL_TABLET | Freq: Three times a day (TID) | ORAL | 0 refills | Status: DC

## 2020-11-09 NOTE — ED Provider Notes (Signed)
[facility]    CSN: [id number] Arrival date & time: 11/09/20  1715      History   Chief Complaint Chief Complaint  Patient presents with   Emesis   Diarrhea    HPI Anna Moses is a 45 y.o. female.   Patient presenting today with 4-day history of acutely worsening epigastric abdominal pain.  Has been having intractable nausea vomiting and diarrhea since onset and states that symptoms have been worsening the past 24 hours.  She was evaluated in the emergency department 2 days ago, CT abdomen pelvis negative for acute abnormality, labs stable and overall benign appearing and improved with medications administered during her stay in the emergency department.  She was sent home with nausea medication and states that that has not been helping and she is unable to tolerate anything p.o. at this time.  She denies fevers, chills, chest pain, shortness of breath, upper respiratory symptoms, new foods or medications, sick contacts.  She does have a past history of GERD and H. pylori infection, per chart review her H. pylori was treated in full last summer.   Past Medical History:  Diagnosis Date   Asthma    Bronchitis    Depression    Obesity    Schizophrenia [facility])     Patient Active Problem List   Diagnosis Date Noted   Effusion of left knee 08/15/2020   Cervical cancer screening 09/18/2019   Routine screening for STI (sexually transmitted infection) A999333   Cyclical vomiting [id number]   Schizophrenia ([place]) 07/24/2019   H. pylori infection 07/24/2019   Seasonal allergies 07/24/2019   Dysphagia 06/03/2019   Nausea and vomiting 06/03/2019   Liver lesion 06/03/2019   Preop examination 06/03/2019    Past Surgical History:  Procedure Laterality Date   CESAREAN SECTION     COLONOSCOPY  06/27/2019   UPPER GASTROINTESTINAL ENDOSCOPY  06/27/2019    OB History   No obstetric history on file.      Home Medications    Prior to Admission medications    Medication Sig Start Date End Date Taking? Authorizing Provider  pantoprazole (PROTONIX) 40 MG tablet Take 1 tablet (40 mg total) by mouth 2 (two) times daily before a meal. 11/09/20  Yes Volney American, PA-C  sucralfate (CARAFATE) 1 g tablet Take 1 tablet (1 g total) by mouth 4 (four) times daily -  with meals and at bedtime. Take 1 tablet into a glass of water and drink 11/09/20  Yes Volney American, PA-C  amoxicillin-clavulanate (AUGMENTIN) 875-125 MG tablet Take 1 tablet by mouth every 12 (twelve) hours. 08/01/20   Volney American, PA-C  dicyclomine (BENTYL) 20 MG tablet Take 1 tablet (20 mg total) by mouth 2 (two) times daily. 09/01/19   Alroy Bailiff, [place], PA-C  lidocaine (XYLOCAINE) 2 % solution Use as directed 10 mLs in the mouth or throat as needed for mouth pain. 08/01/20   Volney American, PA-C  loratadine (CLARITIN REDITABS) 10 MG dissolvable tablet Take 1 tablet (10 mg total) by mouth daily. As needed for allergy symptoms Patient not taking: No sig reported 08/20/19   Benay Pike, MD  medroxyPROGESTERone (DEPO-PROVERA) 150 MG/ML injection Inject 150 mg into the muscle every 3 (three) months.    [provider]  naproxen (NAPROSYN) 500 MG tablet Take 1 tablet (500 mg total) by mouth 2 (two) times daily as needed. 08/01/20   Volney American, PA-C  ondansetron (ZOFRAN ODT) 4 MG disintegrating tablet '4mg'$   ODT q4 hours prn nausea/vomit 11/07/20   Deno Etienne, DO  pantoprazole (PROTONIX) 40 MG tablet Take 1 tablet (40 mg total) by mouth 2 (two) times daily for 14 days. 09/18/19 10/02/19  Benay Pike, MD  promethazine (PHENERGAN) 25 MG tablet Take 1 tablet (25 mg total) by mouth every 6 (six) hours as needed for nausea or vomiting. Patient not taking: No sig reported 09/03/19   Loura Halt A, NP  risperiDONE (RISPERDAL) 1 MG tablet Take 1 mg by mouth at bedtime.    [provider]  sertraline (ZOLOFT) 50 MG tablet Take 50 mg by mouth daily.     [provider]  sucralfate (CARAFATE) 1 g tablet Take 1 tablet (1 g total) by mouth 4 (four) times daily -  with meals and at bedtime. Patient not taking: No sig reported 09/03/19   Loura Halt A, NP  diphenhydrAMINE (BENADRYL) 25 MG tablet Take 1 tablet (25 mg total) by mouth every 6 (six) hours. Patient not taking: Reported on 12/12/2018 10/21/17 05/30/19  Charlesetta Shanks, MD  famotidine (PEPCID) 20 MG tablet Take 1 tablet (20 mg total) by mouth 2 (two) times daily. 04/04/19 05/30/19  Isla Pence, MD  Ibuprofen-diphenhydrAMINE Cit (ADVIL PM) 200-38 MG TABS Take 2 tablets by mouth 2 (two) times daily as needed (headaches).  05/30/19  [provider]  metoCLOPramide (REGLAN) 10 MG tablet Take 1 tablet (10 mg total) by mouth every 6 (six) hours as needed for nausea (nausea/headache). 12/12/18 05/30/19  Antonietta Breach, PA-C    Family History Family History  Problem Relation Age of Onset   Crohn's disease Mother    Liver disease Father    Clotting disorder Father    Liver disease Brother    Colon cancer Neg Hx    Esophageal cancer Neg Hx    Rectal cancer Neg Hx    Stomach cancer Neg Hx     Social History Social History   Tobacco Use   Smoking status: Every Day    Types: Cigarettes   Smokeless tobacco: Never  Vaping Use   Vaping Use: Never used  Substance Use Topics   Alcohol use: No   Drug use: Not Currently    Types: Marijuana     Allergies   Patient has no known allergies.   Review of Systems Review of Systems Per HPI  Physical Exam Triage Vital Signs ED Triage Vitals  Enc Vitals Group     BP 11/09/20 1813 (!) 148/66     Pulse Rate 11/09/20 1813 (!) 53     Resp 11/09/20 1813 18     Temp 11/09/20 1813 99.7 F (37.6 C)     Temp src --      SpO2 11/09/20 1813 98 %     Weight --      Height --      Head Circumference --      Peak Flow --      Pain Score 11/09/20 1814 8     Pain Loc --      Pain Edu? --      Excl. in [place]? --    No data  found.  Updated Vital Signs BP (!) 153/71 (BP Location: Left Arm)   Pulse (!) 52   Temp 97.7 F (36.5 C) (Oral)   Resp 18   SpO2 98%   Visual Acuity Right Eye Distance:   Left Eye Distance:   Bilateral Distance:    Right Eye Near:   Left Eye  Near:    Bilateral Near:     Physical Exam Vitals and nursing note reviewed.  Constitutional:      Appearance: She is ill-appearing.     Comments: Doubled over in pain, has an eme bag in hand  HENT:     Head: Atraumatic.     Mouth/Throat:     Mouth: Mucous membranes are moist.     Pharynx: No posterior oropharyngeal erythema.  Eyes:     Extraocular Movements: Extraocular movements intact.     Conjunctiva/sclera: Conjunctivae normal.  Cardiovascular:     Rate and Rhythm: Normal rate and regular rhythm.     Heart sounds: Normal heart sounds.  Pulmonary:     Effort: Pulmonary effort is normal.     Breath sounds: Normal breath sounds.  Abdominal:     General: Bowel sounds are normal. There is no distension.     Palpations: Abdomen is soft.     Tenderness: There is abdominal tenderness. There is guarding. There is no right CVA tenderness or left CVA tenderness.     Comments: Significant tenderness to palpation and guarding over epigastric region, abdomen appears nondistended and bowel sounds are active  Musculoskeletal:        General: Normal range of motion.     Cervical back: Normal range of motion and neck supple.  Skin:    General: Skin is warm and dry.     Findings: No rash.  Neurological:     Mental Status: She is alert and oriented to person, place, and time.  Psychiatric:        Mood and Affect: Mood normal.        Thought Content: Thought content normal.        Judgment: Judgment normal.     UC Treatments / Results  Labs (all labs ordered are listed, but only abnormal results are displayed) Labs Reviewed  CBC WITH DIFFERENTIAL/PLATELET - Abnormal; Notable for the following components:      Result Value   WBC  10.8 (*)    Platelets 462 (*)    Eosinophils Absolute 0.6 (*)    All other components within normal limits  COMPREHENSIVE METABOLIC PANEL - Abnormal; Notable for the following components:   Potassium 3.4 (*)    Creatinine, Ser 1.01 (*)    All other components within normal limits  LIPASE, BLOOD    EKG   Radiology No results found.  Procedures Procedures (including critical care time)  Medications Ordered in UC Medications  alum & mag hydroxide-simeth (MAALOX/MYLANTA) 200-200-20 MG/5ML suspension 30 mL (30 mLs Oral Given 11/09/20 1834)    And  lidocaine (XYLOCAINE) 2 % viscous mouth solution 15 mL (15 mLs Oral Given 11/09/20 1834)  ondansetron (ZOFRAN-ODT) disintegrating tablet 4 mg (4 mg Oral Given 11/09/20 1833)    Initial Impression / Assessment and Plan / UC Course  I have reviewed the triage vital signs and the nursing notes.  Pertinent labs & imaging results that were available during my care of the patient were reviewed by me and considered in my medical decision making (see chart for details).     Mildly hypertensive and bradycardic in triage, but otherwise vital signs reassuring.  Patient was given a GI cocktail and Zofran while in triage and states her pain went from an 8 out of 10 to a 5 out of 10 and she is now sitting in a chair without significant apparent discomfort or distress.  She has been able to tolerate 2 cups of  ice chips since her medications that were given in triage.  Given her exam findings, history suspect significant gastritis causing her symptoms.  Work-up in the ED without emergent causes of pain several days ago and she is adamant against not going back to the emergency department at this time.  This is reasonable as she does appear stable at this time for close outpatient follow-up.  Will prescribe Protonix and Carafate for outpatient management of symptoms, brat diet, push fluids, close PCP follow-up.  Patient is aware to go to the emergency department if  her symptoms worsen  Final Clinical Impressions(s) / UC Diagnoses   Final diagnoses:  Nausea vomiting and diarrhea  Abdominal pain, epigastric  Weakness   Discharge Instructions   None    ED Prescriptions     Medication Sig Dispense Auth. Provider   pantoprazole (PROTONIX) 40 MG tablet Take 1 tablet (40 mg total) by mouth 2 (two) times daily before a meal. 60 tablet Volney American, PA-C   sucralfate (CARAFATE) 1 g tablet Take 1 tablet (1 g total) by mouth 4 (four) times daily -  with meals and at bedtime. Take 1 tablet into a glass of water and drink 120 tablet Volney American, [place]      PDMP not reviewed this encounter.   Volney American, [place] 11/09/20 2153

## 2020-11-09 NOTE — ED Triage Notes (Addendum)
Pt reports 4 days of N/V/D and ABD pain. Pt sen for same ABD pain N/V on 11-07-20

## 2020-11-09 NOTE — ED Triage Notes (Signed)
Pt standing in triage due to ABD pain.

## 2020-11-10 ENCOUNTER — Other Ambulatory Visit: Payer: Self-pay | Admitting: Family Medicine

## 2020-11-11 ENCOUNTER — Emergency Department (HOSPITAL_COMMUNITY)
Admission: EM | Admit: 2020-11-11 | Discharge: 2020-11-11 | Disposition: A | Discharge status: Home / Self Care | Payer: Medicaid Other | Attending: Student | Admitting: Student

## 2020-11-11 ENCOUNTER — Emergency Department (HOSPITAL_COMMUNITY): Payer: Medicaid Other

## 2020-11-11 ENCOUNTER — Other Ambulatory Visit: Payer: Self-pay

## 2020-11-11 ENCOUNTER — Encounter (HOSPITAL_COMMUNITY): Payer: Self-pay

## 2020-11-11 DIAGNOSIS — E876 Hypokalemia: Secondary | ICD-10-CM | POA: Diagnosis not present

## 2020-11-11 DIAGNOSIS — R1013 Epigastric pain: Secondary | ICD-10-CM | POA: Diagnosis present

## 2020-11-11 DIAGNOSIS — F17210 Nicotine dependence, cigarettes, uncomplicated: Secondary | ICD-10-CM | POA: Diagnosis not present

## 2020-11-11 DIAGNOSIS — J45909 Unspecified asthma, uncomplicated: Secondary | ICD-10-CM | POA: Diagnosis not present

## 2020-11-11 DIAGNOSIS — R112 Nausea with vomiting, unspecified: Secondary | ICD-10-CM | POA: Insufficient documentation

## 2020-11-11 DIAGNOSIS — R1011 Right upper quadrant pain: Secondary | ICD-10-CM | POA: Diagnosis not present

## 2020-11-11 LAB — CBC WITH DIFFERENTIAL/PLATELET
Abs Immature Granulocytes: 0.03 10*3/uL (ref 0.00–0.07)
Basophils Absolute: 0 10*3/uL (ref 0.0–0.1)
Basophils Relative: 0 %
Eosinophils Absolute: 0 10*3/uL (ref 0.0–0.5)
Eosinophils Relative: 0 %
HCT: 41.2 % (ref 36.0–46.0)
Hemoglobin: 13.5 g/dL (ref 12.0–15.0)
Immature Granulocytes: 0 %
Lymphocytes Relative: 41 %
Lymphs Abs: 4.2 10*3/uL — ABNORMAL HIGH (ref 0.7–4.0)
MCH: 28.4 pg (ref 26.0–34.0)
MCHC: 32.8 g/dL (ref 30.0–36.0)
MCV: 86.7 fL (ref 80.0–100.0)
Monocytes Absolute: 0.6 10*3/uL (ref 0.1–1.0)
Monocytes Relative: 6 %
Neutro Abs: 5.4 10*3/uL (ref 1.7–7.7)
Neutrophils Relative %: 53 %
Platelets: 473 10*3/uL — ABNORMAL HIGH (ref 150–400)
RBC: 4.75 MIL/uL (ref 3.87–5.11)
RDW: 13.3 % (ref 11.5–15.5)
WBC: 10.2 10*3/uL (ref 4.0–10.5)
nRBC: 0 % (ref 0.0–0.2)

## 2020-11-11 LAB — COMPREHENSIVE METABOLIC PANEL
ALT: 27 U/L (ref 0–44)
AST: 25 U/L (ref 15–41)
Albumin: 4.1 g/dL (ref 3.5–5.0)
Alkaline Phosphatase: 55 U/L (ref 38–126)
Anion gap: 11 (ref 5–15)
BUN: 12 mg/dL (ref 6–20)
CO2: 24 mmol/L (ref 22–32)
Calcium: 9.3 mg/dL (ref 8.9–10.3)
Chloride: 101 mmol/L (ref 98–111)
Creatinine, Ser: 0.97 mg/dL (ref 0.44–1.00)
GFR, Estimated: >60 mL/min (ref >60)
Glucose, Bld: 122 mg/dL — ABNORMAL HIGH (ref 70–99)
Potassium: 3.1 mmol/L — ABNORMAL LOW (ref 3.5–5.1)
Sodium: 136 mmol/L (ref 135–145)
Total Bilirubin: 0.9 mg/dL (ref 0.3–1.2)
Total Protein: 7.9 g/dL (ref 6.5–8.1)

## 2020-11-11 LAB — URINALYSIS, ROUTINE W REFLEX MICROSCOPIC
Bacteria, UA: NONE SEEN
Bilirubin Urine: NEGATIVE
Glucose, UA: NEGATIVE mg/dL
Hgb urine dipstick: NEGATIVE
Ketones, ur: 20 mg/dL — AB
Leukocytes,Ua: NEGATIVE
Nitrite: NEGATIVE
Protein, ur: 30 mg/dL — AB
Specific Gravity, Urine: 1.025 (ref 1.005–1.030)
pH: 5 (ref 5.0–8.0)

## 2020-11-11 LAB — I-STAT BETA HCG BLOOD, ED (MC, WL, AP ONLY): I-stat hCG, quantitative: <5 m[IU]/mL (ref <5)

## 2020-11-11 LAB — LIPASE, BLOOD: Lipase: 25 U/L (ref 11–51)

## 2020-11-11 MED ORDER — ALUM & MAG HYDROXIDE-SIMETH 200-200-20 MG/5ML PO SUSP
30.0000 mL | Freq: Once | ORAL | Status: AC
  Administered 2020-11-11: 30 mL via ORAL
  Filled 2020-11-11: qty 30

## 2020-11-11 MED ORDER — FAMOTIDINE IN NACL 20-0.9 MG/50ML-% IV SOLN
20.0000 mg | Freq: Once | INTRAVENOUS | Status: AC
  Administered 2020-11-11: 20 mg via INTRAVENOUS
  Filled 2020-11-11: qty 50

## 2020-11-11 MED ORDER — ONDANSETRON 4 MG PO TBDP
4.0000 mg | ORAL_TABLET | Freq: Once | ORAL | Status: AC
  Administered 2020-11-11: 4 mg via ORAL
  Filled 2020-11-11: qty 1

## 2020-11-11 MED ORDER — DIPHENHYDRAMINE HCL 50 MG/ML IJ SOLN
12.5000 mg | Freq: Once | INTRAMUSCULAR | Status: AC
  Administered 2020-11-11: 12.5 mg via INTRAVENOUS
  Filled 2020-11-11: qty 1

## 2020-11-11 MED ORDER — LIDOCAINE VISCOUS HCL 2 % MT SOLN
15.0000 mL | Freq: Once | OROMUCOSAL | Status: AC
  Administered 2020-11-11: 15 mL via ORAL
  Filled 2020-11-11: qty 15

## 2020-11-11 MED ORDER — IOHEXOL 350 MG/ML SOLN
80.0000 mL | Freq: Once | INTRAVENOUS | Status: AC | PRN
  Administered 2020-11-11: 80 mL via INTRAVENOUS

## 2020-11-11 MED ORDER — DROPERIDOL 2.5 MG/ML IJ SOLN
1.2500 mg | Freq: Once | INTRAMUSCULAR | Status: AC
  Administered 2020-11-11: 1.25 mg via INTRAVENOUS
  Filled 2020-11-11: qty 2

## 2020-11-11 MED ORDER — OXYCODONE-ACETAMINOPHEN 5-325 MG PO TABS
1.0000 | ORAL_TABLET | Freq: Once | ORAL | Status: AC
  Administered 2020-11-11: 1 via ORAL
  Filled 2020-11-11: qty 1

## 2020-11-11 MED ORDER — LACTATED RINGERS IV BOLUS
1000.0000 mL | Freq: Once | INTRAVENOUS | Status: AC
  Administered 2020-11-11: 1000 mL via INTRAVENOUS

## 2020-11-11 MED ORDER — DICYCLOMINE HCL 20 MG PO TABS: 20.0000 mg | ORAL_TABLET | Freq: Two times a day (BID) | ORAL | 0 refills | Status: DC

## 2020-11-11 MED ORDER — FAMOTIDINE 20 MG PO TABS: 20.0000 mg | ORAL_TABLET | Freq: Two times a day (BID) | ORAL | 0 refills | Status: DC

## 2020-11-11 NOTE — ED Notes (Signed)
Pt ambulatory to restroom w/out assistance.  

## 2020-11-11 NOTE — ED Provider Notes (Signed)
[facility] DEPT Provider Note   CSN: [id number] Arrival date & time: 11/11/20  1753     History Chief Complaint  Patient presents with   Abdominal Pain    Anna Moses is a 45 y.o. female with PMH peptic ulcer disease, asthma, bronchitis, schizophrenia who presents to the emergency department for evaluation of abdominal pain nausea and vomiting.  She states that she has been unable to tolerate p.o. for the last 3 days due to severe persistent epigastric pain.  Denies fevers, chest pain, shortness of breath, headache, cough or other systemic symptoms.  She states that this feels similar to her previous presentations, and states that she has a follow-up GI appointment in 5 days but was unable to make it to this appointment because she cannot eat.   Abdominal Pain Associated symptoms: nausea and vomiting   Associated symptoms: no chest pain, no chills, no cough, no dysuria, no fever, no hematuria, no shortness of breath and no sore throat       Past Medical History:  Diagnosis Date   Asthma    Bronchitis    Depression    Obesity    Schizophrenia [facility])     Patient Active Problem List   Diagnosis Date Noted   Effusion of left knee 08/15/2020   Cervical cancer screening 09/18/2019   Routine screening for STI (sexually transmitted infection) A999333   Cyclical vomiting [id number]   Schizophrenia ([place]) 07/24/2019   H. pylori infection 07/24/2019   Seasonal allergies 07/24/2019   Dysphagia 06/03/2019   Nausea and vomiting 06/03/2019   Liver lesion 06/03/2019   Preop examination 06/03/2019    Past Surgical History:  Procedure Laterality Date   CESAREAN SECTION     COLONOSCOPY  06/27/2019   UPPER GASTROINTESTINAL ENDOSCOPY  06/27/2019     OB History   No obstetric history on file.     Family History  Problem Relation Age of Onset   Crohn's disease Mother    Liver disease Father    Clotting disorder Father    Liver disease  Brother    Colon cancer Neg Hx    Esophageal cancer Neg Hx    Rectal cancer Neg Hx    Stomach cancer Neg Hx     Social History   Tobacco Use   Smoking status: Every Day    Types: Cigarettes   Smokeless tobacco: Never  Vaping Use   Vaping Use: Never used  Substance Use Topics   Alcohol use: No   Drug use: Not Currently    Types: Marijuana    Home Medications Prior to Admission medications   Medication Sig Start Date End Date Taking? Authorizing Provider  medroxyPROGESTERone (DEPO-PROVERA) 150 MG/ML injection Inject 150 mg into the muscle every 3 (three) months.   Yes [provider]  amoxicillin-clavulanate (AUGMENTIN) 875-125 MG tablet Take 1 tablet by mouth every 12 (twelve) hours. Patient not taking: No sig reported 08/01/20   Volney American, PA-C  dicyclomine (BENTYL) 20 MG tablet Take 1 tablet (20 mg total) by mouth 2 (two) times daily. Patient not taking: Reported on 11/11/2020 09/01/19   Eustaquio Maize, PA-C  lidocaine (XYLOCAINE) 2 % solution Use as directed 10 mLs in the mouth or throat as needed for mouth pain. Patient not taking: No sig reported 08/01/20   Volney American, PA-C  loratadine (CLARITIN REDITABS) 10 MG dissolvable tablet Take 1 tablet (10 mg total) by mouth daily. As needed for allergy symptoms Patient not taking:  No sig reported 08/20/19   Benay Pike, MD  naproxen (NAPROSYN) 500 MG tablet Take 1 tablet (500 mg total) by mouth 2 (two) times daily as needed. Patient not taking: No sig reported 08/01/20   Volney American, PA-C  ondansetron (ZOFRAN ODT) 4 MG disintegrating tablet '4mg'$  ODT q4 hours prn nausea/vomit Patient taking differently: Take 4 mg by mouth every 8 (eight) hours as needed for nausea or vomiting. 11/07/20   Deno Etienne, DO  pantoprazole (PROTONIX) 40 MG tablet Take 1 tablet (40 mg total) by mouth 2 (two) times daily for 14 days. Patient not taking: Reported on 11/11/2020 09/18/19 10/02/19  Benay Pike, MD   pantoprazole (PROTONIX) 40 MG tablet Take 1 tablet (40 mg total) by mouth 2 (two) times daily before a meal. Patient not taking: No sig reported 11/09/20   Volney American, PA-C  promethazine (PHENERGAN) 25 MG tablet Take 1 tablet (25 mg total) by mouth every 6 (six) hours as needed for nausea or vomiting. Patient not taking: No sig reported 09/03/19   Loura Halt A, NP  risperiDONE (RISPERDAL) 1 MG tablet Take 1 mg by mouth at bedtime. Patient not taking: Reported on 11/11/2020    [provider]  sertraline (ZOLOFT) 50 MG tablet Take 50 mg by mouth daily. Patient not taking: Reported on 11/11/2020    [provider]  sucralfate (CARAFATE) 1 g tablet Take 1 tablet (1 g total) by mouth 4 (four) times daily -  with meals and at bedtime. Patient not taking: No sig reported 09/03/19   Loura Halt A, NP  sucralfate (CARAFATE) 1 g tablet Take 1 tablet (1 g total) by mouth 4 (four) times daily -  with meals and at bedtime. Take 1 tablet into a glass of water and drink Patient not taking: No sig reported 11/09/20   Volney American, PA-C  diphenhydrAMINE (BENADRYL) 25 MG tablet Take 1 tablet (25 mg total) by mouth every 6 (six) hours. Patient not taking: Reported on 12/12/2018 10/21/17 05/30/19  Charlesetta Shanks, MD  famotidine (PEPCID) 20 MG tablet Take 1 tablet (20 mg total) by mouth 2 (two) times daily. 04/04/19 05/30/19  Isla Pence, MD  Ibuprofen-diphenhydrAMINE Cit (ADVIL PM) 200-38 MG TABS Take 2 tablets by mouth 2 (two) times daily as needed (headaches).  05/30/19  [provider]  metoCLOPramide (REGLAN) 10 MG tablet Take 1 tablet (10 mg total) by mouth every 6 (six) hours as needed for nausea (nausea/headache). 12/12/18 05/30/19  Antonietta Breach, PA-C    Allergies    Patient has no known allergies.  Review of Systems   Review of Systems  Constitutional:  Negative for chills and fever.  HENT:  Negative for ear pain and sore throat.   Eyes:  Negative for pain and  visual disturbance.  Respiratory:  Negative for cough and shortness of breath.   Cardiovascular:  Negative for chest pain and palpitations.  Gastrointestinal:  Positive for abdominal pain, nausea and vomiting.  Genitourinary:  Negative for dysuria and hematuria.  Musculoskeletal:  Negative for arthralgias and back pain.  Skin:  Negative for color change and rash.  Neurological:  Negative for seizures and syncope.  All other systems reviewed and are negative.  Physical Exam Updated Vital Signs BP (!) 149/87 (BP Location: Left Arm)   Pulse 94   Temp 98.7 F (37.1 C) (Oral)   Resp 18   SpO2 100%   Physical Exam Vitals and nursing note reviewed.  Constitutional:  General: She is not in acute distress.    Appearance: She is well-developed.  HENT:     Head: Normocephalic and atraumatic.  Eyes:     Conjunctiva/sclera: Conjunctivae normal.  Cardiovascular:     Rate and Rhythm: Normal rate and regular rhythm.     Heart sounds: No murmur heard. Pulmonary:     Effort: Pulmonary effort is normal. No respiratory distress.     Breath sounds: Normal breath sounds.  Abdominal:     Palpations: Abdomen is soft.     Tenderness: There is abdominal tenderness in the epigastric area.  Musculoskeletal:     Cervical back: Neck supple.  Skin:    General: Skin is warm and dry.  Neurological:     Mental Status: She is alert.    ED Results / Procedures / Treatments   Labs (all labs ordered are listed, but only abnormal results are displayed) Labs Reviewed  CBC WITH DIFFERENTIAL/PLATELET - Abnormal; Notable for the following components:      Result Value   Platelets 473 (*)    Lymphs Abs 4.2 (*)    All other components within normal limits  COMPREHENSIVE METABOLIC PANEL - Abnormal; Notable for the following components:   Potassium 3.1 (*)    Glucose, Bld 122 (*)    All other components within normal limits  URINALYSIS, ROUTINE W REFLEX MICROSCOPIC - Abnormal; Notable for the  following components:   Color, Urine AMBER (*)    APPearance CLOUDY (*)    Ketones, ur 20 (*)    Protein, ur 30 (*)    All other components within normal limits  LIPASE, BLOOD  I-STAT BETA HCG BLOOD, ED ([facility], [facility], [facility] ONLY)    EKG None  Radiology US Abdomen Limited RUQ (LIVER/GB)  Result Date: 11/11/2020 CLINICAL DATA:  Right upper quadrant abdominal pain EXAM: ULTRASOUND ABDOMEN LIMITED RIGHT UPPER QUADRANT COMPARISON:  None. FINDINGS: Gallbladder: No gallstones or wall thickening visualized. No sonographic Murphy sign noted by sonographer. Common bile duct: Diameter: 4 mm Liver: No focal lesion identified. Within normal limits in parenchymal echogenicity. Portal vein is patent on color Doppler imaging with normal direction of blood flow towards the liver. Other: None. IMPRESSION: Normal right upper quadrant ultrasound. Electronically Signed   By: Ulyses Jarred M.D.   On: 11/11/2020 19:23    Procedures Procedures   Medications Ordered in ED Medications  lactated ringers bolus 1,000 mL (has no administration in time range)  ondansetron (ZOFRAN-ODT) disintegrating tablet 4 mg (4 mg Oral Given 11/11/20 1906)  oxyCODONE-acetaminophen (PERCOCET/ROXICET) 5-325 MG per tablet 1 tablet (1 tablet Oral Given 11/11/20 1906)  alum & mag hydroxide-simeth (MAALOX/MYLANTA) 200-200-20 MG/5ML suspension 30 mL (30 mLs Oral Given 11/11/20 2132)    And  lidocaine (XYLOCAINE) 2 % viscous mouth solution 15 mL (15 mLs Oral Given 11/11/20 2132)  famotidine (PEPCID) IVPB 20 mg premix (20 mg Intravenous New Bag/Given 11/11/20 2132)  iohexol (OMNIPAQUE) 350 MG/ML injection 80 mL (80 mLs Intravenous Contrast Given 11/11/20 2156)    ED Course  I have reviewed the triage vital signs and the nursing notes.  Pertinent labs & imaging results that were available during my care of the patient were reviewed by me and considered in my medical decision making (see chart for details).    MDM Rules/Calculators/A&P                            Patient seen in the emergency department for  evaluation of abdominal pain.  Physical exam reveals tenderness in the epigastric area with no rebound or guarding.  Laboratory evaluation reveals mild hypokalemia to 3.1 but is otherwise unremarkable.  Pregnancy test negative.  Urinalysis unremarkable.  Upper quadrant ultrasound obtained is unremarkable.  CT abdomen pelvis unremarkable.  Patient given GI cocktail and Pepcid with no improvement of symptoms.  Patient given droperidol with dramatic improvement of symptoms.  She was able to tolerate p.o. here in the emergency department and patient requesting discharge.  She was discharged with instructions to follow-up with her GI physician at her regular scheduled appointment in 5 days. Final Clinical Impression(s) / ED Diagnoses Final diagnoses:  RUQ abdominal pain    Rx / DC Orders ED Discharge Orders     None        Prateek Knipple, Debe Coder, MD 11/11/20 2307

## 2020-11-11 NOTE — ED Triage Notes (Signed)
Pt reports abdominal that sometimes radiates around right flank region. Pt endorses N/V and is unable to keep anything down. Pt reports she has been unable to urinate normally for 5 days.

## 2020-11-11 NOTE — ED Provider Notes (Signed)
Emergency Medicine Provider Triage Evaluation Note  Anna Moses , a 45 y.o. female  was evaluated in triage.  Pt complains of epigastric abd pain, nv. Reports decreased urination. States she felt like she could not urinate so she used a catheter that she bought at a pharmacy.  Review of Systems  Positive: Nv, abd pain, decreased urination Negative: dysuria  Physical Exam  BP (!) 149/87 (BP Location: Left Arm)   Pulse 94   Temp 98.7 F (37.1 C) (Oral)   Resp 18   SpO2 100%  Gen:   Awake, no distress   Resp:  Normal effort  MSK:   Moves extremities without difficulty  Other:  Epigastric and ruq ttp  Medical Decision Making  Medically screening exam initiated at 6:09 PM.  Appropriate orders placed.  Anna Moses was informed that the remainder of the evaluation will be completed by another provider, this initial triage assessment does not replace that evaluation, and the importance of remaining in the ED until their evaluation is complete.     Bishop Dublin 11/11/20 1812    Carmin Muskrat, MD 11/11/20 [phone]

## 2020-11-17 ENCOUNTER — Ambulatory Visit (INDEPENDENT_AMBULATORY_CARE_PROVIDER_SITE_OTHER): Payer: Medicaid Other | Admitting: Family Medicine

## 2020-11-17 ENCOUNTER — Encounter: Payer: Self-pay | Admitting: Family Medicine

## 2020-11-17 ENCOUNTER — Other Ambulatory Visit: Payer: Self-pay

## 2020-11-17 VITALS — BP 127/75 | HR 99 | Ht 61.0 in | Wt 248.0 lb

## 2020-11-17 DIAGNOSIS — Z3042 Encounter for surveillance of injectable contraceptive: Secondary | ICD-10-CM | POA: Diagnosis not present

## 2020-11-17 DIAGNOSIS — F339 Major depressive disorder, recurrent, unspecified: Secondary | ICD-10-CM | POA: Insufficient documentation

## 2020-11-17 DIAGNOSIS — Z309 Encounter for contraceptive management, unspecified: Secondary | ICD-10-CM | POA: Insufficient documentation

## 2020-11-17 LAB — POCT URINE PREGNANCY: Preg Test, Ur: NEGATIVE

## 2020-11-17 MED ORDER — MEDROXYPROGESTERONE ACETATE 150 MG/ML IM SUSP
150.0000 mg | Freq: Once | INTRAMUSCULAR | Status: AC
  Administered 2020-11-17: 150 mg via INTRAMUSCULAR

## 2020-11-17 NOTE — Patient Instructions (Addendum)
It was great seeing you today!  Today we discussed contraception, your pregnancy test was negative and we administered your depo injection today. You will be due for another injection in 3 months. Please make sure to use protection with each sexual encounter as contraception.   We also discussed mood, if you ever have have thoughts of harming yourself, please call [phone] if you are having any thoughts. Below is a list of therapists, please pick one and establish care at your convenience. Also continue to utilize your support system.   Please follow up at your next scheduled appointment in 2 weeks, if anything arises between now and then, please don't hesitate to contact our office.   Thank you for allowing [place] to be a part of your medical care!  Thank you, Dr. Larae Grooms    Therapy and Counseling Resources Most providers on this list will take Medicaid. Patients with commercial insurance or Medicare should contact their insurance company to get a list of in network providers.  [facility]:Psychiatry and Counseling [address] [facility] [place]. [place], [place] [address] [place], [place], [place] [address]      [place] [address]  [place], [place] [address] [phone]  [place] [address], [place]  [place], [place] [address]       [phone]     [facility] (virtual only) [phone]  Jinny Blossom [facility] [address], [place], [place]  [facility]:  [place]. [place] [phone]  [facility]:  [place] [address] Rosie Fate [phone]  [facility] (under & uninsured) [address], [place] [phone]    [email or web address]    [place] [address]  [place]    [phone]  [facility]  [place]     Phone:  [phone]     [place]  [place] [address]      [place], [place] ext [place]: [place], [place], [place]   [place] ([place] therapist) [email or web address]  [place] [address]   [place] [address]    [phone]    [facility]   [address],  [place], [place]   [place]  [place]  [facility]  [address] [place], [place]        [phone]  Open Access/Walk In Clinic under & uninsured  [facility]  [address] [place], [place] Crisis [phone]  [facility] the [place],  ([place])   [place], [place]: [phone]) 8:30 - 12; 1 - 2:30  [facility] of the [place],  [place], [place]    [phone]):8:30 - 12; 2 - 3PM  [facility] [employer],  [address],  [place]; [phone]):   Mon - Fri 8 AM - 5 PM  [facility]  MWF 12:30 to 3:00 or call to schedule an appointment  [phone]  Specific Provider options Psychology Today  [email or web address] click on find a therapist  enter your zip code  left side and select or tailor a therapist for your specific need.   [facility] Provider Directory [email or web address]  (Medicaid)   Follow all drop down to find a provider  [place] or [email or web address] [address], [place], [place] Recovery support and educational   24- Hour Availability:   [facility]  [address] [place], [place] Crisis [phone]  Family  Service of the [employer] [phone]  [place]  [phone]   [place]  [phone] (after hours)  [facility]/Mobile Crisis   [phone]  [place] National Suicide Hotline  [phone] Diamantina Monks)  Call 911 or go to emergency room  [facility]  [phone]);  [place] and [employer]  [phone]); [place], [place], [place], [place], [place], [place], [place]     If you are feeling suicidal or depression symptoms worsen please immediately go to: CALL 988  If you are thinking about harming yourself or having thoughts of suicide, or if you know someone who is, seek help right away. If you are in crisis, make sure you are not left alone.  If someone else is in crisis, make sure he/she/they is not left alone  Call 988 OR 1-800-273-TALK  24 Hour Availability for [place]  [address] [place], [place] Crisis [phone]    Other crisis resources:  [facility] of the [employer] (Domestic Violence, Rape & Victim Assistance [phone]  [facility] [place]    (ONLY from 8am-4pm)    [phone]  [facility] Mobile Crisis Unit (24/7)   [phone]  [place] National Suicide Hotline   [phone] Diamantina Monks)

## 2020-11-17 NOTE — Assessment & Plan Note (Signed)
-  Upreg negative -depo administered today, plan for repeat in 3 months, patient aware -safe sex practices discussed, encouraged using protection when sexually active as birth control does not protect against STIs

## 2020-11-17 NOTE — Progress Notes (Signed)
    SUBJECTIVE:   CHIEF COMPLAINT / HPI:   Patient presents for continued contraception management. Has been on depo since the beginning of the year. Last injection was 08/21/2020, has been tolerating medication well without side effects. Currently no sexually active. Desires to continue with the depo and get another injection today.  PERTINENT  PMH / PSH:   Depression  Endorses occasional episodes of feeling down, the primary trigger of this is when she feels helpless and cannot help her kids and grandchildren as they are currently experiencing a lot of stressors at this time. Denies current SI. Most recent SI was about a month ago, did not have a plan or attempt. States that is lasted a short period, has strong protective factors including her children and grandchildren. States that she would never want to harm herself because she would not want to put her family through this. Previously on zoloft 50 mg but currently not taking it due to unknown reasons. Previously saw a therapist but is not currently, she is open to seeing one again. Last attempt was years ago when she overdosed on medication and was hospitalized at this time, denies any attempt or even plan since then.   OBJECTIVE:   BP 127/75   Pulse 99   Ht '5\' 1"'$  (1.549 m)   Wt 248 lb (112.5 kg)   SpO2 100%   BMI 46.86 kg/m   General: Patient well-appearing, in no acute distress. HEENT: normocephalic, atraumatic, non-tender thyroid, no cervical LAD CV: RRR, no murmurs or gallops auscultated Resp: CTAB Abdomen: soft, nontender, presence of bowel sounds Ext: radial pulses strong and equal bilaterally, no LE edema noted bilaterally, capillary refill less than 2 sec Derm: skin warm and dry to touch, no rashes or lesions noted  Neuro: normal gait  Psych: denies SI or HI, denies both plan and intent  ASSESSMENT/PLAN:   Contraception management -Upreg negative -depo administered today, plan for repeat in 3 months, patient  aware -safe sex practices discussed, encouraged using protection when sexually active as birth control does not protect against STIs   Depression, recurrent (HCC) -PHQ-9 score of 12 with 1 for question 9 -reassuringly patient is not currently experiencing SI -list of therapists provided -patient politely declines restarting medication at this time -coping techniques discussed, reassurance provided -follow up in 2 weeks for mood check, consider restarting zoloft at next visit      Donney Dice, [place]

## 2020-11-17 NOTE — Assessment & Plan Note (Signed)
-  PHQ-9 score of 12 with 1 for question 9 -reassuringly patient is not currently experiencing SI -list of therapists provided -patient politely declines restarting medication at this time -coping techniques discussed, reassurance provided -follow up in 2 weeks for mood check, consider restarting zoloft at next visit

## 2020-12-08 ENCOUNTER — Ambulatory Visit: Payer: Medicaid Other | Admitting: Family Medicine

## 2020-12-08 NOTE — Progress Notes (Deleted)
    SUBJECTIVE:   CHIEF COMPLAINT / HPI:   ***  PERTINENT  PMH / PSH: ***  OBJECTIVE:   There were no vitals taken for this visit.   General: Alert, no acute distress Cardio: Normal S1 and S2, RRR, no r/m/g Pulm: CTAB, normal work of breathing Abdomen: Bowel sounds normal. Abdomen soft and non-tender.  Extremities: No peripheral edema.  Neuro: Cranial nerves grossly intact   ASSESSMENT/PLAN:   No problem-specific Assessment & Plan notes found for this encounter.     Filbert Craze, MD [facility]   

## 2021-02-02 ENCOUNTER — Other Ambulatory Visit: Payer: Self-pay

## 2021-02-02 ENCOUNTER — Ambulatory Visit (INDEPENDENT_AMBULATORY_CARE_PROVIDER_SITE_OTHER): Payer: Medicaid Other

## 2021-02-02 DIAGNOSIS — Z3042 Encounter for surveillance of injectable contraceptive: Secondary | ICD-10-CM

## 2021-02-02 MED ORDER — MEDROXYPROGESTERONE ACETATE 150 MG/ML IM SUSP
150.0000 mg | Freq: Once | INTRAMUSCULAR | Status: AC
  Administered 2021-02-02: 150 mg via INTRAMUSCULAR

## 2021-02-02 NOTE — Progress Notes (Signed)
Patient here today for Depo Provera injection and is within her dates.    Last contraceptive appt was 11/17/2020  Depo given in [place] today.  Site unremarkable & patient tolerated injection.    Next injection due 1/10-1/24.  Reminder card given.    Talbot Grumbling, RN

## 2021-02-08 ENCOUNTER — Emergency Department (HOSPITAL_COMMUNITY): Payer: Medicaid Other

## 2021-02-08 ENCOUNTER — Emergency Department (HOSPITAL_COMMUNITY)
Admission: EM | Admit: 2021-02-08 | Discharge: 2021-02-08 | Disposition: A | Discharge status: Home / Self Care | Payer: Medicaid Other | Attending: Emergency Medicine | Admitting: Emergency Medicine

## 2021-02-08 ENCOUNTER — Other Ambulatory Visit: Payer: Self-pay

## 2021-02-08 ENCOUNTER — Encounter (HOSPITAL_COMMUNITY): Payer: Self-pay

## 2021-02-08 DIAGNOSIS — F17210 Nicotine dependence, cigarettes, uncomplicated: Secondary | ICD-10-CM | POA: Insufficient documentation

## 2021-02-08 DIAGNOSIS — J45909 Unspecified asthma, uncomplicated: Secondary | ICD-10-CM | POA: Diagnosis not present

## 2021-02-08 DIAGNOSIS — N9489 Other specified conditions associated with female genital organs and menstrual cycle: Secondary | ICD-10-CM | POA: Diagnosis not present

## 2021-02-08 DIAGNOSIS — M545 Low back pain, unspecified: Secondary | ICD-10-CM | POA: Insufficient documentation

## 2021-02-08 DIAGNOSIS — Z79899 Other long term (current) drug therapy: Secondary | ICD-10-CM | POA: Diagnosis not present

## 2021-02-08 DIAGNOSIS — R112 Nausea with vomiting, unspecified: Secondary | ICD-10-CM | POA: Diagnosis not present

## 2021-02-08 DIAGNOSIS — R1013 Epigastric pain: Secondary | ICD-10-CM | POA: Insufficient documentation

## 2021-02-08 DIAGNOSIS — K921 Melena: Secondary | ICD-10-CM | POA: Insufficient documentation

## 2021-02-08 DIAGNOSIS — R109 Unspecified abdominal pain: Secondary | ICD-10-CM

## 2021-02-08 LAB — URINALYSIS, ROUTINE W REFLEX MICROSCOPIC
Bilirubin Urine: NEGATIVE
Glucose, UA: NEGATIVE mg/dL
Ketones, ur: NEGATIVE mg/dL
Leukocytes,Ua: NEGATIVE
Nitrite: NEGATIVE
Protein, ur: 30 mg/dL — AB
Specific Gravity, Urine: 1.029 (ref 1.005–1.030)
pH: 5 (ref 5.0–8.0)

## 2021-02-08 LAB — RAPID URINE DRUG SCREEN, HOSP PERFORMED
Amphetamines: NOT DETECTED
Barbiturates: NOT DETECTED
Benzodiazepines: NOT DETECTED
Cocaine: NOT DETECTED
Opiates: NOT DETECTED
Tetrahydrocannabinol: POSITIVE — AB

## 2021-02-08 LAB — CBC WITH DIFFERENTIAL/PLATELET
Abs Immature Granulocytes: 0.06 10*3/uL (ref 0.00–0.07)
Basophils Absolute: 0 10*3/uL (ref 0.0–0.1)
Basophils Relative: 0 %
Eosinophils Absolute: 0 10*3/uL (ref 0.0–0.5)
Eosinophils Relative: 0 %
HCT: 38.2 % (ref 36.0–46.0)
Hemoglobin: 12.7 g/dL (ref 12.0–15.0)
Immature Granulocytes: 0 %
Lymphocytes Relative: 19 %
Lymphs Abs: 2.6 10*3/uL (ref 0.7–4.0)
MCH: 29.1 pg (ref 26.0–34.0)
MCHC: 33.2 g/dL (ref 30.0–36.0)
MCV: 87.4 fL (ref 80.0–100.0)
Monocytes Absolute: 0.5 10*3/uL (ref 0.1–1.0)
Monocytes Relative: 4 %
Neutro Abs: 10.6 10*3/uL — ABNORMAL HIGH (ref 1.7–7.7)
Neutrophils Relative %: 77 %
Platelets: 446 10*3/uL — ABNORMAL HIGH (ref 150–400)
RBC: 4.37 MIL/uL (ref 3.87–5.11)
RDW: 14.5 % (ref 11.5–15.5)
WBC: 13.9 10*3/uL — ABNORMAL HIGH (ref 4.0–10.5)
nRBC: 0 % (ref 0.0–0.2)

## 2021-02-08 LAB — LIPASE, BLOOD: Lipase: 35 U/L (ref 11–51)

## 2021-02-08 LAB — POC OCCULT BLOOD, ED: Fecal Occult Bld: POSITIVE — AB

## 2021-02-08 LAB — COMPREHENSIVE METABOLIC PANEL
ALT: 13 U/L (ref 0–44)
AST: 26 U/L (ref 15–41)
Albumin: 3.9 g/dL (ref 3.5–5.0)
Alkaline Phosphatase: 51 U/L (ref 38–126)
Anion gap: 8 (ref 5–15)
BUN: 16 mg/dL (ref 6–20)
CO2: 21 mmol/L — ABNORMAL LOW (ref 22–32)
Calcium: 9 mg/dL (ref 8.9–10.3)
Chloride: 109 mmol/L (ref 98–111)
Creatinine, Ser: 0.99 mg/dL (ref 0.44–1.00)
GFR, Estimated: >60 mL/min (ref >60)
Glucose, Bld: 163 mg/dL — ABNORMAL HIGH (ref 70–99)
Potassium: 3.2 mmol/L — ABNORMAL LOW (ref 3.5–5.1)
Sodium: 138 mmol/L (ref 135–145)
Total Bilirubin: 0.7 mg/dL (ref 0.3–1.2)
Total Protein: 7.3 g/dL (ref 6.5–8.1)

## 2021-02-08 LAB — HCG, QUANTITATIVE, PREGNANCY: hCG, Beta Chain, Quant, S: <1 m[IU]/mL (ref <5)

## 2021-02-08 MED ORDER — PANTOPRAZOLE SODIUM 40 MG PO TBEC: 40.0000 mg | DELAYED_RELEASE_TABLET | Freq: Two times a day (BID) | ORAL | 0 refills | Status: DC

## 2021-02-08 MED ORDER — SODIUM CHLORIDE 0.9 % IV BOLUS
1000.0000 mL | Freq: Once | INTRAVENOUS | Status: AC
  Administered 2021-02-08: 1000 mL via INTRAVENOUS

## 2021-02-08 MED ORDER — MORPHINE SULFATE (PF) 4 MG/ML IV SOLN
4.0000 mg | Freq: Once | INTRAVENOUS | Status: AC
  Administered 2021-02-08: 4 mg via INTRAVENOUS
  Filled 2021-02-08: qty 1

## 2021-02-08 MED ORDER — SUCRALFATE 1 G PO TABS: 1.0000 g | ORAL_TABLET | Freq: Three times a day (TID) | ORAL | 0 refills | Status: DC

## 2021-02-08 MED ORDER — PANTOPRAZOLE SODIUM 40 MG IV SOLR
40.0000 mg | Freq: Once | INTRAVENOUS | Status: AC
  Administered 2021-02-08: 40 mg via INTRAVENOUS
  Filled 2021-02-08: qty 40

## 2021-02-08 MED ORDER — POTASSIUM CHLORIDE CRYS ER 20 MEQ PO TBCR
40.0000 meq | EXTENDED_RELEASE_TABLET | Freq: Once | ORAL | Status: AC
  Administered 2021-02-08: 40 meq via ORAL
  Filled 2021-02-08: qty 2

## 2021-02-08 MED ORDER — ONDANSETRON HCL 4 MG/2ML IJ SOLN
4.0000 mg | Freq: Once | INTRAMUSCULAR | Status: AC
  Administered 2021-02-08: 4 mg via INTRAVENOUS
  Filled 2021-02-08: qty 2

## 2021-02-08 NOTE — ED Provider Notes (Signed)
[place] DEPT Provider Note   CSN: [id number] Arrival date & time: 02/08/21  0256     History Chief Complaint  Patient presents with   Abdominal Pain   Vomiting   Flank Pain    Anna Moses is a 45 y.o. female.  HPI Patient is a 45 year old female with past medical history significant for asthma, bronchitis, depression, obesity, schizophrenia, nephrolithiasis  Patient is presented to the ER today with complaints of 3 days of nausea and vomiting she states that she also has left flank/some low back pain.  She states that this is achy.  She denies any fevers.  States that it feels similar to episode she has had over the past 2 years approximately a total of 4.  She states that she has come to the ER for these episodes and received pain medicine that aborted her pain and states that her symptoms would resolve for at least 4 to 6 months.  She has been seen by gastroenterology who said she had an EGD done that was unremarkable.  She states that her pain is 8/10 and she is constantly nausea she states she vomited right before being moved back to the emergency room bed and states that she still feels that if she is having she would vomit.  She endorses some dark stools denies any lightheadedness or dizziness.  She describes the pain as constant aching and stabbing. No BRBPR.  She states that she is on Depo shot and does not notice that she has any spotting vaginally.  No abnormal vaginal discharge.  No vaginal or pelvic pain.  No fevers.  No history of IV drug use.     Past Medical History:  Diagnosis Date   Asthma    Bronchitis    Depression    Obesity    Schizophrenia [facility])     Patient Active Problem List   Diagnosis Date Noted   Contraception management 11/17/2020   Depression, recurrent ([place]) 11/17/2020   Effusion of left knee 08/15/2020   Cervical cancer screening 09/18/2019   Routine screening for STI (sexually transmitted  infection) 37/16/9678   Cyclical vomiting 93/81/0175   Schizophrenia ([place]) 07/24/2019   H. pylori infection 07/24/2019   Seasonal allergies 07/24/2019   Dysphagia 06/03/2019   Nausea and vomiting 06/03/2019   Liver lesion 06/03/2019   Preop examination 06/03/2019    Past Surgical History:  Procedure Laterality Date   CESAREAN SECTION     COLONOSCOPY  06/27/2019   UPPER GASTROINTESTINAL ENDOSCOPY  06/27/2019     OB History   No obstetric history on file.     Family History  Problem Relation Age of Onset   Crohn's disease Mother    Liver disease Father    Clotting disorder Father    Liver disease Brother    Colon cancer Neg Hx    Esophageal cancer Neg Hx    Rectal cancer Neg Hx    Stomach cancer Neg Hx     Social History   Tobacco Use   Smoking status: Every Day    Types: Cigarettes   Smokeless tobacco: Never  Vaping Use   Vaping Use: Never used  Substance Use Topics   Alcohol use: No   Drug use: Not Currently    Types: Marijuana    Home Medications Prior to Admission medications   Medication Sig Start Date End Date Taking? Authorizing Provider  amoxicillin-clavulanate (AUGMENTIN) 875-125 MG tablet Take 1 tablet by mouth every 12 (twelve) hours.  Patient not taking: No sig reported 08/01/20   Volney American, PA-C  dicyclomine (BENTYL) 20 MG tablet Take 1 tablet (20 mg total) by mouth 2 (two) times daily. 11/11/20   Kommor, Madison, MD  famotidine (PEPCID) 20 MG tablet Take 1 tablet (20 mg total) by mouth 2 (two) times daily. 11/11/20   Kommor, Madison, MD  lidocaine (XYLOCAINE) 2 % solution Use as directed 10 mLs in the mouth or throat as needed for mouth pain. Patient not taking: No sig reported 08/01/20   Volney American, PA-C  loratadine (CLARITIN REDITABS) 10 MG dissolvable tablet Take 1 tablet (10 mg total) by mouth daily. As needed for allergy symptoms Patient not taking: No sig reported 08/20/19   Benay Pike, MD  medroxyPROGESTERone  (DEPO-PROVERA) 150 MG/ML injection Inject 150 mg into the muscle every 3 (three) months.    [provider]  naproxen (NAPROSYN) 500 MG tablet Take 1 tablet (500 mg total) by mouth 2 (two) times daily as needed. Patient not taking: No sig reported 08/01/20   Volney American, PA-C  ondansetron (ZOFRAN ODT) 4 MG disintegrating tablet 4mg  ODT q4 hours prn nausea/vomit Patient taking differently: Take 4 mg by mouth every 8 (eight) hours as needed for nausea or vomiting. 11/07/20   Deno Etienne, DO  pantoprazole (PROTONIX) 40 MG tablet Take 1 tablet (40 mg total) by mouth 2 (two) times daily before a meal. Patient not taking: No sig reported 11/09/20   Volney American, PA-C  pantoprazole (PROTONIX) 40 MG tablet Take 1 tablet (40 mg total) by mouth 2 (two) times daily for 14 days. 02/08/21 02/22/21  Tedd Sias, PA  risperiDONE (RISPERDAL) 1 MG tablet Take 1 mg by mouth at bedtime. Patient not taking: No sig reported    [provider]  sertraline (ZOLOFT) 50 MG tablet Take 50 mg by mouth daily. Patient not taking: No sig reported    [provider]  sucralfate (CARAFATE) 1 g tablet Take 1 tablet (1 g total) by mouth 4 (four) times daily -  with meals and at bedtime. Take 1 tablet into a glass of water and drink 02/08/21   Pati Gallo S, PA  diphenhydrAMINE (BENADRYL) 25 MG tablet Take 1 tablet (25 mg total) by mouth every 6 (six) hours. Patient not taking: Reported on 12/12/2018 10/21/17 05/30/19  Charlesetta Shanks, MD  Ibuprofen-diphenhydrAMINE Cit (ADVIL PM) 200-38 MG TABS Take 2 tablets by mouth 2 (two) times daily as needed (headaches).  05/30/19  [provider]  metoCLOPramide (REGLAN) 10 MG tablet Take 1 tablet (10 mg total) by mouth every 6 (six) hours as needed for nausea (nausea/headache). 12/12/18 05/30/19  Antonietta Breach, PA-C    Allergies    Patient has no known allergies.  Review of Systems   Review of Systems  Constitutional:  Positive for  fatigue. Negative for chills and fever.  HENT:  Negative for congestion.   Eyes:  Negative for pain.  Respiratory:  Negative for cough and shortness of breath.   Cardiovascular:  Negative for chest pain and leg swelling.  Gastrointestinal:  Positive for abdominal pain, diarrhea, nausea and vomiting.       Dark stool  Genitourinary:  Negative for dysuria.  Musculoskeletal:  Negative for myalgias.  Skin:  Negative for rash.  Neurological:  Negative for dizziness and headaches.   Physical Exam Updated Vital Signs BP 137/79   Pulse (!) 57   Temp 98.8 F (37.1 C) (Oral)   Resp 19  Ht 5\' 1"  (1.549 m)   Wt 112.5 kg   SpO2 99%   BMI 46.86 kg/m   Physical Exam Vitals and nursing note reviewed.  Constitutional:      General: She is not in acute distress.    Comments: Uncomfortable 45 year old female in no acute distress.  Able answer questions appropriate follow commands.  HENT:     Head: Normocephalic and atraumatic.     Nose: Nose normal.     Mouth/Throat:     Mouth: Mucous membranes are dry.  Eyes:     General: No scleral icterus. Cardiovascular:     Rate and Rhythm: Normal rate and regular rhythm.     Pulses: Normal pulses.     Heart sounds: Normal heart sounds.  Pulmonary:     Effort: Pulmonary effort is normal. No respiratory distress.     Breath sounds: No wheezing.  Abdominal:     Palpations: Abdomen is soft.     Tenderness: There is no abdominal tenderness. There is left CVA tenderness. There is no guarding or rebound.     Comments: Morbidly obese abdomen.  No abdominal tenderness.  No guarding or rebound.  Musculoskeletal:     Cervical back: Normal range of motion.     Right lower leg: No edema.     Left lower leg: No edema.  Skin:    General: Skin is warm and dry.     Capillary Refill: Capillary refill takes less than 2 seconds.  Neurological:     Mental Status: She is alert. Mental status is at baseline.  Psychiatric:        Mood and Affect: Mood normal.         Behavior: Behavior normal.    ED Results / Procedures / Treatments   Labs (all labs ordered are listed, but only abnormal results are displayed) Labs Reviewed  CBC WITH DIFFERENTIAL/PLATELET - Abnormal; Notable for the following components:      Result Value   WBC 13.9 (*)    Platelets 446 (*)    Neutro Abs 10.6 (*)    All other components within normal limits  COMPREHENSIVE METABOLIC PANEL - Abnormal; Notable for the following components:   Potassium 3.2 (*)    CO2 21 (*)    Glucose, Bld 163 (*)    All other components within normal limits  URINALYSIS, ROUTINE W REFLEX MICROSCOPIC - Abnormal; Notable for the following components:   APPearance HAZY (*)    Hgb urine dipstick MODERATE (*)    Protein, ur 30 (*)    Bacteria, UA RARE (*)    All other components within normal limits  POC OCCULT BLOOD, ED - Abnormal; Notable for the following components:   Fecal Occult Bld POSITIVE (*)    All other components within normal limits  LIPASE, BLOOD  HCG, QUANTITATIVE, PREGNANCY  RAPID URINE DRUG SCREEN, HOSP PERFORMED    EKG None  Radiology CT Renal Stone Study  Result Date: 02/08/2021 CLINICAL DATA:  Flank pain, kidney stone suspected L sided flank pain hx of nephrolithiasis per pt. EXAM: CT ABDOMEN AND PELVIS WITHOUT CONTRAST TECHNIQUE: Multidetector CT imaging of the abdomen and pelvis was performed following the standard protocol without IV contrast. COMPARISON:  CT 11/11/2020. FINDINGS: Lower chest: No acute abnormality. Hepatobiliary: No focal liver abnormality is seen. No gallstones, gallbladder wall thickening, or biliary dilatation. Pancreas: Unremarkable. No pancreatic ductal dilatation or surrounding inflammatory changes. Spleen: Normal in size without focal abnormality. Adrenals/Urinary Tract: Adrenal glands are unremarkable. No  hydronephrosis. There is a 2 mm left lower pole renal stone. Bladder is unremarkable. Stomach/Bowel: The stomach is within normal limits. There  is no evidence of bowel obstruction. The appendix is normal. No acute inflammatory process. Vascular/Lymphatic: Minimal aortic atherosclerotic calcifications. No AAA. No lymphadenopathy. Reproductive: Unremarkable. Other: No abdominal wall hernia or abnormality. No abdominopelvic ascites. Musculoskeletal: Mild bilateral hip arthritis. Multilevel degenerative changes of the spine. No suspicious lytic or blastic lesions. No acute bony abnormality. IMPRESSION: Nonobstructive 2 mm left lower pole renal stone. No obstructive uropathy or any other acute findings in abdomen or pelvis. Electronically Signed   By: Maurine Simmering M.D.   On: 02/08/2021 08:58    Procedures Procedures   Medications Ordered in ED Medications  ondansetron (ZOFRAN) injection 4 mg (4 mg Intravenous Given 02/08/21 0750)  sodium chloride 0.9 % bolus 1,000 mL (1,000 mLs Intravenous Bolus from Bag 02/08/21 0751)  potassium chloride SA (KLOR-CON) CR tablet 40 mEq (40 mEq Oral Given 02/08/21 0753)  morphine 4 MG/ML injection 4 mg (4 mg Intravenous Given 02/08/21 0750)  pantoprazole (PROTONIX) injection 40 mg (40 mg Intravenous Given 02/08/21 1002)    ED Course  I have reviewed the triage vital signs and the nursing notes.  Pertinent labs & imaging results that were available during my care of the patient were reviewed by me and considered in my medical decision making (see chart for details).  Clinical Course as of 02/08/21 1033  Mon Feb 08, 2021  1025 Fecal Occult Blood, POC(!): POSITIVE [WF]  1025 Hemoglobin: 12.7 [WF]    Clinical Course User Index [WF] Tedd Sias, [place]   MDM Rules/Calculators/A&P                          Patient is a 45 year old female with past medical history discussed in HPI.  On my chart review I see mention in prior ER notes that patient has a history of PUD. I am unable to review GI notes.  Patient seems to be having left flank pain and does have CVA tenderness no cervical abdominal tenderness  although she does point to her epigastrium and states that she has some discomfort here.  Rectal exam was chaperoned by destiny RN.  Notable for melena.  Fecal occult positive.  Hemoglobin within normal limits.  CMP unremarkable apart from mild hypokalemia which was orally repleted.  hCG negative pregnancy.  CBC with very mild leukocytosis as well as elevated platelets perhaps some degree of hemoconcentration.  Given 1 L of normal saline along with small dose of morphine, Zofran and pantoprazole.   On my reassessment patient states that her symptoms are completely resolved.  Urinalysis unremarkable apart from moderate blood patient states that she is on the Depo shot and does occasionally have some vaginal spotting.  She will follow-up with her primary care to discuss her hematuria and her ongoing abdominal pain and symptoms.  She also follow-up with her gastroenterologist.  She states that she is not in fact taking the pantoprazole, Pepcid or Carafate that she is prescribed.  I refilled her pantoprazole and Carafate and recommend she follow-up with her gastroenterologist to discuss what their recommendations are for further care.  Tolerating p.o.  Discharged home at this time.  Final Clinical Impression(s) / ED Diagnoses Final diagnoses:  Flank pain  Left flank pain  Melena  Epigastric abdominal pain    Rx / DC Orders ED Discharge Orders  Ordered    sucralfate (CARAFATE) 1 g tablet  3 times daily with meals & bedtime        02/08/21 1029    pantoprazole (PROTONIX) 40 MG tablet  2 times daily        02/08/21 1029             Pati Gallo [place], [place] 02/08/21 1036    Blanchie Dessert, MD 02/08/21 1411

## 2021-02-08 NOTE — Discharge Instructions (Addendum)
Your symptoms are concerning for peptic ulcer disease.  Given that you have dark poop and evidence of digested blood in your stool.  Please follow-up with your gastroenterologist.  Please take the Protonix I prescribed you I also prescribed you Carafate which I would like you to take.  Please call your gastroenterologist today in order to make an appointment ASAP.  Please avoid alcohol, ibuprofen, naproxen, Aleve, or other NSAIDs

## 2021-02-08 NOTE — ED Triage Notes (Signed)
Pt complains of left flank pain and vomiting x 3 days.

## 2021-02-09 ENCOUNTER — Encounter (HOSPITAL_COMMUNITY): Payer: Self-pay | Admitting: Emergency Medicine

## 2021-02-09 ENCOUNTER — Ambulatory Visit (HOSPITAL_COMMUNITY)
Admission: EM | Admit: 2021-02-09 | Discharge: 2021-02-09 | Disposition: A | Discharge status: Home / Self Care | Payer: Medicaid Other

## 2021-02-09 ENCOUNTER — Other Ambulatory Visit: Payer: Self-pay

## 2021-02-09 DIAGNOSIS — R1013 Epigastric pain: Secondary | ICD-10-CM | POA: Diagnosis not present

## 2021-02-09 NOTE — ED Triage Notes (Addendum)
Pt is present today with an allergic reaction to Carafate. Pt states that she has loss of appetite, SOB, and vomiting.

## 2021-02-09 NOTE — ED Provider Notes (Signed)
[place]    CSN: [id number] Arrival date & time: 02/09/21  1714      History   Chief Complaint Chief Complaint  Patient presents with   Allergic Reaction    HPI Anna Moses is a 45 y.o. female presenting with severe abdominal pain.  This patient was actually seen in the emergency department about 12 hours ago for this, they diagnosed her with peptic ulcer disease and prescribed Carafate which she has been taking, states that this is not controlling the symptoms.  States that it feels like there is a rock in her stomach, she cannot eat anything because it comes right back up.  Last bowel movement was about 1 day ago and she is still passing gas.  She is walking around the room standing in extreme pain.  Does endorse hematemesis, hematochezia.  HPI  Past Medical History:  Diagnosis Date   Asthma    Bronchitis    Depression    Obesity    Schizophrenia [facility])     Patient Active Problem List   Diagnosis Date Noted   Contraception management 11/17/2020   Depression, recurrent ([place]) 11/17/2020   Effusion of left knee 08/15/2020   Cervical cancer screening 09/18/2019   Routine screening for STI (sexually transmitted infection) 25/42/7062   Cyclical vomiting 37/62/8315   Schizophrenia ([place]) 07/24/2019   H. pylori infection 07/24/2019   Seasonal allergies 07/24/2019   Dysphagia 06/03/2019   Nausea and vomiting 06/03/2019   Liver lesion 06/03/2019   Preop examination 06/03/2019    Past Surgical History:  Procedure Laterality Date   CESAREAN SECTION     COLONOSCOPY  06/27/2019   UPPER GASTROINTESTINAL ENDOSCOPY  06/27/2019    OB History   No obstetric history on file.      Home Medications    Prior to Admission medications   Medication Sig Start Date End Date Taking? Authorizing Provider  amoxicillin-clavulanate (AUGMENTIN) 875-125 MG tablet Take 1 tablet by mouth every 12 (twelve) hours. Patient not taking: No sig reported 08/01/20   Volney American, PA-C  dicyclomine (BENTYL) 20 MG tablet Take 1 tablet (20 mg total) by mouth 2 (two) times daily. 11/11/20   Kommor, Madison, MD  famotidine (PEPCID) 20 MG tablet Take 1 tablet (20 mg total) by mouth 2 (two) times daily. 11/11/20   Kommor, Madison, MD  lidocaine (XYLOCAINE) 2 % solution Use as directed 10 mLs in the mouth or throat as needed for mouth pain. Patient not taking: No sig reported 08/01/20   Volney American, PA-C  loratadine (CLARITIN REDITABS) 10 MG dissolvable tablet Take 1 tablet (10 mg total) by mouth daily. As needed for allergy symptoms Patient not taking: No sig reported 08/20/19   Benay Pike, MD  medroxyPROGESTERone (DEPO-PROVERA) 150 MG/ML injection Inject 150 mg into the muscle every 3 (three) months.    [provider]  naproxen (NAPROSYN) 500 MG tablet Take 1 tablet (500 mg total) by mouth 2 (two) times daily as needed. Patient not taking: No sig reported 08/01/20   Volney American, PA-C  ondansetron (ZOFRAN ODT) 4 MG disintegrating tablet 4mg  ODT q4 hours prn nausea/vomit Patient taking differently: Take 4 mg by mouth every 8 (eight) hours as needed for nausea or vomiting. 11/07/20   Deno Etienne, DO  pantoprazole (PROTONIX) 40 MG tablet Take 1 tablet (40 mg total) by mouth 2 (two) times daily before a meal. Patient not taking: No sig reported 11/09/20   Volney American, PA-C  pantoprazole (PROTONIX) 40 MG tablet Take 1 tablet (40 mg total) by mouth 2 (two) times daily for 14 days. 02/08/21 02/22/21  Tedd Sias, PA  risperiDONE (RISPERDAL) 1 MG tablet Take 1 mg by mouth at bedtime. Patient not taking: No sig reported    [provider]  sertraline (ZOLOFT) 50 MG tablet Take 50 mg by mouth daily. Patient not taking: No sig reported    [provider]  sucralfate (CARAFATE) 1 g tablet Take 1 tablet (1 g total) by mouth 4 (four) times daily -  with meals and at bedtime. Take 1 tablet into a glass of water and  drink 02/08/21   Pati Gallo S, PA  diphenhydrAMINE (BENADRYL) 25 MG tablet Take 1 tablet (25 mg total) by mouth every 6 (six) hours. Patient not taking: Reported on 12/12/2018 10/21/17 05/30/19  Charlesetta Shanks, MD  Ibuprofen-diphenhydrAMINE Cit (ADVIL PM) 200-38 MG TABS Take 2 tablets by mouth 2 (two) times daily as needed (headaches).  05/30/19  [provider]  metoCLOPramide (REGLAN) 10 MG tablet Take 1 tablet (10 mg total) by mouth every 6 (six) hours as needed for nausea (nausea/headache). 12/12/18 05/30/19  Antonietta Breach, PA-C    Family History Family History  Problem Relation Age of Onset   Crohn's disease Mother    Liver disease Father    Clotting disorder Father    Liver disease Brother    Colon cancer Neg Hx    Esophageal cancer Neg Hx    Rectal cancer Neg Hx    Stomach cancer Neg Hx     Social History Social History   Tobacco Use   Smoking status: Every Day    Types: Cigarettes   Smokeless tobacco: Never  Vaping Use   Vaping Use: Never used  Substance Use Topics   Alcohol use: No   Drug use: Not Currently    Types: Marijuana     Allergies   Patient has no known allergies.   Review of Systems Review of Systems  Constitutional:  Negative for appetite change, chills, diaphoresis, fever and unexpected weight change.  HENT:  Negative for congestion, ear pain, sinus pressure, sinus pain, sneezing, sore throat and trouble swallowing.   Respiratory:  Negative for cough, chest tightness and shortness of breath.   Cardiovascular:  Negative for chest pain.  Gastrointestinal:  Positive for abdominal pain. Negative for abdominal distention, anal bleeding, blood in stool, constipation, diarrhea, nausea, rectal pain and vomiting.  Genitourinary:  Negative for dysuria, flank pain, frequency and urgency.  Musculoskeletal:  Negative for back pain and myalgias.  Neurological:  Negative for dizziness, light-headedness and headaches.  All other systems reviewed and are  negative.   Physical Exam Triage Vital Signs ED Triage Vitals  Enc Vitals Group     BP 02/09/21 1822 (!) 157/84     Pulse Rate 02/09/21 1822 (!) 55     Resp 02/09/21 1822 19     Temp 02/09/21 1822 99.1 F (37.3 C)     Temp src --      SpO2 02/09/21 1822 98 %     Weight --      Height --      Head Circumference --      Peak Flow --      Pain Score 02/09/21 1821 0     Pain Loc --      Pain Edu? --      Excl. in [place]? --    No data found.  Updated Vital  Signs BP (!) 157/84   Pulse (!) 55   Temp 99.1 F (37.3 C)   Resp 19   SpO2 98%   Visual Acuity Right Eye Distance:   Left Eye Distance:   Bilateral Distance:    Right Eye Near:   Left Eye Near:    Bilateral Near:     Physical Exam Vitals reviewed.  Constitutional:      General: She is not in acute distress.    Appearance: Normal appearance. She is not ill-appearing.  HENT:     Head: Normocephalic and atraumatic.     Mouth/Throat:     Mouth: Mucous membranes are moist.     Comments: Moist mucous membranes Eyes:     Extraocular Movements: Extraocular movements intact.     Pupils: Pupils are equal, round, and reactive to light.  Cardiovascular:     Rate and Rhythm: Normal rate and regular rhythm.     Heart sounds: Normal heart sounds.  Pulmonary:     Effort: Pulmonary effort is normal.     Breath sounds: Normal breath sounds. No wheezing, rhonchi or rales.  Abdominal:     General: Bowel sounds are normal. There is no distension.     Palpations: Abdomen is soft. There is no mass.     Tenderness: There is abdominal tenderness in the epigastric area. There is no right CVA tenderness, left CVA tenderness, guarding or rebound.     Comments: Uncomfortable throughout exam   Skin:    General: Skin is warm.     Capillary Refill: Capillary refill takes less than 2 seconds.     Comments: Good skin turgor  Neurological:     General: No focal deficit present.     Mental Status: She is alert and oriented to person,  place, and time.  Psychiatric:        Mood and Affect: Mood normal.        Behavior: Behavior normal.     UC Treatments / Results  Labs (all labs ordered are listed, but only abnormal results are displayed) Labs Reviewed - No data to display  EKG   Radiology CT Renal Stone Study  Result Date: 02/08/2021 CLINICAL DATA:  Flank pain, kidney stone suspected L sided flank pain hx of nephrolithiasis per pt. EXAM: CT ABDOMEN AND PELVIS WITHOUT CONTRAST TECHNIQUE: Multidetector CT imaging of the abdomen and pelvis was performed following the standard protocol without IV contrast. COMPARISON:  CT 11/11/2020. FINDINGS: Lower chest: No acute abnormality. Hepatobiliary: No focal liver abnormality is seen. No gallstones, gallbladder wall thickening, or biliary dilatation. Pancreas: Unremarkable. No pancreatic ductal dilatation or surrounding inflammatory changes. Spleen: Normal in size without focal abnormality. Adrenals/Urinary Tract: Adrenal glands are unremarkable. No hydronephrosis. There is a 2 mm left lower pole renal stone. Bladder is unremarkable. Stomach/Bowel: The stomach is within normal limits. There is no evidence of bowel obstruction. The appendix is normal. No acute inflammatory process. Vascular/Lymphatic: Minimal aortic atherosclerotic calcifications. No AAA. No lymphadenopathy. Reproductive: Unremarkable. Other: No abdominal wall hernia or abnormality. No abdominopelvic ascites. Musculoskeletal: Mild bilateral hip arthritis. Multilevel degenerative changes of the spine. No suspicious lytic or blastic lesions. No acute bony abnormality. IMPRESSION: Nonobstructive 2 mm left lower pole renal stone. No obstructive uropathy or any other acute findings in abdomen or pelvis. Electronically Signed   By: Maurine Simmering M.D.   On: 02/08/2021 08:58    Procedures Procedures (including critical care time)  Medications Ordered in UC Medications - No data to display  Initial Impression / Assessment and  Plan / UC Course  I have reviewed the triage vital signs and the nursing notes.  Pertinent labs & imaging results that were available during my care of the patient were reviewed by me and considered in my medical decision making (see chart for details).     This patient is a very pleasant 45 y.o. year old female presenting with severe epigastric pain.  She is uncomfortable at rest and throughout the exam.  This patient has a history of peptic ulcer disease, I am unable to do imaging or endoscopy at this urgent care to monitor this, referred to the emergency department.  She does follow with GI outpatient, I advised her to follow with them in the future..   Final Clinical Impressions(s) / UC Diagnoses   Final diagnoses:  Epigastric pain   Discharge Instructions   None    ED Prescriptions   None    PDMP not reviewed this encounter.   Hazel Sams, PA-C 02/09/21 1842

## 2021-02-09 NOTE — ED Notes (Addendum)
Pt c/o seen in the ED yesterday and given Carafate for acid reflux. States still having epigastric pain and SOB. Pt in no distress, speaking in complete sx's.

## 2021-02-10 ENCOUNTER — Other Ambulatory Visit: Payer: Self-pay

## 2021-02-10 ENCOUNTER — Emergency Department (HOSPITAL_BASED_OUTPATIENT_CLINIC_OR_DEPARTMENT_OTHER)
Admission: EM | Admit: 2021-02-10 | Discharge: 2021-02-10 | Disposition: A | Discharge status: Home / Self Care | Payer: Medicaid Other | Attending: Emergency Medicine | Admitting: Emergency Medicine

## 2021-02-10 ENCOUNTER — Encounter (HOSPITAL_BASED_OUTPATIENT_CLINIC_OR_DEPARTMENT_OTHER): Payer: Self-pay

## 2021-02-10 DIAGNOSIS — F17210 Nicotine dependence, cigarettes, uncomplicated: Secondary | ICD-10-CM | POA: Insufficient documentation

## 2021-02-10 DIAGNOSIS — Z79899 Other long term (current) drug therapy: Secondary | ICD-10-CM | POA: Insufficient documentation

## 2021-02-10 DIAGNOSIS — R109 Unspecified abdominal pain: Secondary | ICD-10-CM | POA: Insufficient documentation

## 2021-02-10 DIAGNOSIS — J45909 Unspecified asthma, uncomplicated: Secondary | ICD-10-CM | POA: Insufficient documentation

## 2021-02-10 MED ORDER — KETOROLAC TROMETHAMINE 60 MG/2ML IM SOLN
60.0000 mg | Freq: Once | INTRAMUSCULAR | Status: DC
  Filled 2021-02-10: qty 2

## 2021-02-10 MED ORDER — KETOROLAC TROMETHAMINE 30 MG/ML IJ SOLN
30.0000 mg | Freq: Once | INTRAMUSCULAR | Status: AC
  Administered 2021-02-10: 30 mg via INTRAVENOUS

## 2021-02-10 MED ORDER — HYDROMORPHONE HCL 1 MG/ML IJ SOLN: 1.0000 mg | Freq: Once | INTRAMUSCULAR | Status: DC

## 2021-02-10 MED ORDER — HYDROMORPHONE HCL 1 MG/ML IJ SOLN
2.0000 mg | Freq: Once | INTRAMUSCULAR | Status: AC
  Administered 2021-02-10: 2 mg via INTRAVENOUS

## 2021-02-10 MED ORDER — HYDROMORPHONE HCL 1 MG/ML IJ SOLN
2.0000 mg | Freq: Once | INTRAMUSCULAR | Status: DC
Start: 2021-02-10 — End: 2021-02-10
  Filled 2021-02-10: qty 2

## 2021-02-10 MED ORDER — NAPROXEN 500 MG PO TABS: 500.0000 mg | ORAL_TABLET | Freq: Two times a day (BID) | ORAL | 0 refills | Status: DC

## 2021-02-10 MED ORDER — HYDROCODONE-ACETAMINOPHEN 5-325 MG PO TABS: 1.0000 | ORAL_TABLET | Freq: Four times a day (QID) | ORAL | 0 refills | Status: DC | PRN

## 2021-02-10 NOTE — ED Provider Notes (Signed)
[place] EMERGENCY DEPT Provider Note   CSN: [id number] Arrival date & time: 02/10/21  0231     History Chief Complaint  Patient presents with   Flank Pain    Anna Moses is a 45 y.o. female.  Patient is a 45 year old female with past medical history of asthma, obesity, and schizophrenia.  She presents today with complaints of left flank pain.  Patient seen 2 days ago at [facility] with similar complaints.  She was told that she had kidney stones and ulcers.  She was prescribed pantoprazole and sucralfate, but this does not seem to be helping.  She describes severe pain to the left flank and lower back, but no radiation into the legs.  She denies any bowel or bladder complaints.  The history is provided by the patient.  Flank Pain This is a new problem. The current episode started 2 days ago. The problem occurs constantly. The problem has been rapidly worsening. Nothing aggravates the symptoms. Nothing relieves the symptoms.      Past Medical History:  Diagnosis Date   Asthma    Bronchitis    Depression    Obesity    Schizophrenia [facility])     Patient Active Problem List   Diagnosis Date Noted   Contraception management 11/17/2020   Depression, recurrent ([place]) 11/17/2020   Effusion of left knee 08/15/2020   Cervical cancer screening 09/18/2019   Routine screening for STI (sexually transmitted infection) 78/93/8101   Cyclical vomiting 75/01/2584   Schizophrenia ([place]) 07/24/2019   H. pylori infection 07/24/2019   Seasonal allergies 07/24/2019   Dysphagia 06/03/2019   Nausea and vomiting 06/03/2019   Liver lesion 06/03/2019   Preop examination 06/03/2019    Past Surgical History:  Procedure Laterality Date   CESAREAN SECTION     COLONOSCOPY  06/27/2019   UPPER GASTROINTESTINAL ENDOSCOPY  06/27/2019     OB History   No obstetric history on file.     Family History  Problem Relation Age of Onset   Crohn's disease Mother    Liver  disease Father    Clotting disorder Father    Liver disease Brother    Colon cancer Neg Hx    Esophageal cancer Neg Hx    Rectal cancer Neg Hx    Stomach cancer Neg Hx     Social History   Tobacco Use   Smoking status: Every Day    Types: Cigarettes   Smokeless tobacco: Never  Vaping Use   Vaping Use: Never used  Substance Use Topics   Alcohol use: No   Drug use: Not Currently    Types: Marijuana    Home Medications Prior to Admission medications   Medication Sig Start Date End Date Taking? Authorizing Provider  amoxicillin-clavulanate (AUGMENTIN) 875-125 MG tablet Take 1 tablet by mouth every 12 (twelve) hours. Patient not taking: No sig reported 08/01/20   Volney American, PA-C  dicyclomine (BENTYL) 20 MG tablet Take 1 tablet (20 mg total) by mouth 2 (two) times daily. 11/11/20   Kommor, Madison, MD  famotidine (PEPCID) 20 MG tablet Take 1 tablet (20 mg total) by mouth 2 (two) times daily. 11/11/20   Kommor, Madison, MD  lidocaine (XYLOCAINE) 2 % solution Use as directed 10 mLs in the mouth or throat as needed for mouth pain. Patient not taking: No sig reported 08/01/20   Volney American, PA-C  loratadine (CLARITIN REDITABS) 10 MG dissolvable tablet Take 1 tablet (10 mg total) by mouth daily. As  needed for allergy symptoms Patient not taking: No sig reported 08/20/19   Benay Pike, MD  medroxyPROGESTERone (DEPO-PROVERA) 150 MG/ML injection Inject 150 mg into the muscle every 3 (three) months.    [provider]  naproxen (NAPROSYN) 500 MG tablet Take 1 tablet (500 mg total) by mouth 2 (two) times daily as needed. Patient not taking: No sig reported 08/01/20   Volney American, PA-C  ondansetron (ZOFRAN ODT) 4 MG disintegrating tablet 4mg  ODT q4 hours prn nausea/vomit Patient taking differently: Take 4 mg by mouth every 8 (eight) hours as needed for nausea or vomiting. 11/07/20   Deno Etienne, DO  pantoprazole (PROTONIX) 40 MG tablet Take 1 tablet (40 mg  total) by mouth 2 (two) times daily before a meal. Patient not taking: No sig reported 11/09/20   Volney American, PA-C  pantoprazole (PROTONIX) 40 MG tablet Take 1 tablet (40 mg total) by mouth 2 (two) times daily for 14 days. 02/08/21 02/22/21  Tedd Sias, PA  risperiDONE (RISPERDAL) 1 MG tablet Take 1 mg by mouth at bedtime. Patient not taking: No sig reported    [provider]  sertraline (ZOLOFT) 50 MG tablet Take 50 mg by mouth daily. Patient not taking: No sig reported    [provider]  sucralfate (CARAFATE) 1 g tablet Take 1 tablet (1 g total) by mouth 4 (four) times daily -  with meals and at bedtime. Take 1 tablet into a glass of water and drink 02/08/21   Pati Gallo S, PA  diphenhydrAMINE (BENADRYL) 25 MG tablet Take 1 tablet (25 mg total) by mouth every 6 (six) hours. Patient not taking: Reported on 12/12/2018 10/21/17 05/30/19  Charlesetta Shanks, MD  Ibuprofen-diphenhydrAMINE Cit (ADVIL PM) 200-38 MG TABS Take 2 tablets by mouth 2 (two) times daily as needed (headaches).  05/30/19  [provider]  metoCLOPramide (REGLAN) 10 MG tablet Take 1 tablet (10 mg total) by mouth every 6 (six) hours as needed for nausea (nausea/headache). 12/12/18 05/30/19  Antonietta Breach, PA-C    Allergies    Patient has no known allergies.  Review of Systems   Review of Systems  Genitourinary:  Positive for flank pain.  All other systems reviewed and are negative.  Physical Exam Updated Vital Signs BP 130/84 (BP Location: Right Arm)   Pulse 68   Temp 98.9 F (37.2 C) (Oral)   Resp 16   Ht 5\' 1"  (1.549 m)   Wt 112.5 kg   SpO2 100%   BMI 46.86 kg/m   Physical Exam Vitals and nursing note reviewed.  Constitutional:      General: She is not in acute distress.    Appearance: She is well-developed. She is not diaphoretic.  HENT:     Head: Normocephalic and atraumatic.  Cardiovascular:     Rate and Rhythm: Normal rate and regular rhythm.     Heart sounds: No  murmur heard.   No friction rub. No gallop.  Pulmonary:     Effort: Pulmonary effort is normal. No respiratory distress.     Breath sounds: Normal breath sounds. No wheezing.  Abdominal:     General: Bowel sounds are normal. There is no distension.     Palpations: Abdomen is soft.     Tenderness: There is no abdominal tenderness. There is left CVA tenderness. There is no right CVA tenderness, guarding or rebound.  Musculoskeletal:        General: Normal range of motion.     Cervical  back: Normal range of motion and neck supple.     Comments: There is tenderness to palpation in the left lower lumbar region.  There is no palpable abnormality.  There is no bony tenderness or step-off.  Skin:    General: Skin is warm and dry.  Neurological:     General: No focal deficit present.     Mental Status: She is alert and oriented to person, place, and time.    ED Results / Procedures / Treatments   Labs (all labs ordered are listed, but only abnormal results are displayed) Labs Reviewed - No data to display  EKG None  Radiology CT Renal Stone Study  Result Date: 02/08/2021 CLINICAL DATA:  Flank pain, kidney stone suspected L sided flank pain hx of nephrolithiasis per pt. EXAM: CT ABDOMEN AND PELVIS WITHOUT CONTRAST TECHNIQUE: Multidetector CT imaging of the abdomen and pelvis was performed following the standard protocol without IV contrast. COMPARISON:  CT 11/11/2020. FINDINGS: Lower chest: No acute abnormality. Hepatobiliary: No focal liver abnormality is seen. No gallstones, gallbladder wall thickening, or biliary dilatation. Pancreas: Unremarkable. No pancreatic ductal dilatation or surrounding inflammatory changes. Spleen: Normal in size without focal abnormality. Adrenals/Urinary Tract: Adrenal glands are unremarkable. No hydronephrosis. There is a 2 mm left lower pole renal stone. Bladder is unremarkable. Stomach/Bowel: The stomach is within normal limits. There is no evidence of bowel  obstruction. The appendix is normal. No acute inflammatory process. Vascular/Lymphatic: Minimal aortic atherosclerotic calcifications. No AAA. No lymphadenopathy. Reproductive: Unremarkable. Other: No abdominal wall hernia or abnormality. No abdominopelvic ascites. Musculoskeletal: Mild bilateral hip arthritis. Multilevel degenerative changes of the spine. No suspicious lytic or blastic lesions. No acute bony abnormality. IMPRESSION: Nonobstructive 2 mm left lower pole renal stone. No obstructive uropathy or any other acute findings in abdomen or pelvis. Electronically Signed   By: Maurine Simmering M.D.   On: 02/08/2021 08:58    Procedures Procedures   Medications Ordered in ED Medications  ketorolac (TORADOL) injection 60 mg (has no administration in time range)  HYDROmorphone (DILAUDID) injection 2 mg (has no administration in time range)    ED Course  I have reviewed the triage vital signs and the nursing notes.  Pertinent labs & imaging results that were available during my care of the patient were reviewed by me and considered in my medical decision making (see chart for details).    MDM Rules/Calculators/A&P  Patient presenting here with complaints of flank pain as described in the HPI.  She was seen at [facility] 2 days ago with similar complaints.  Her work-up showed nonobstructing renal calculus, but no other abnormality.  Her pain seems mostly musculoskeletal in nature given the results from 2 days ago.  Patient will be discharged with naproxen, hydrocodone, and follow-up as needed if not improving.  Final Clinical Impression(s) / ED Diagnoses Final diagnoses:  None    Rx / DC Orders ED Discharge Orders     None        Veryl Speak, MD 02/10/21 0502

## 2021-02-10 NOTE — ED Triage Notes (Signed)
Patient here POV from Home with Bilateral Flank Pain.  Patient states she was at [facility] for Same 2 days PTA. Patient was diagnosed with Kidney Stones and PUD. Patient was discharged with Protonix and Sucralfate but Patient has not been able to tolerate the Sucralfate due to Size.  Patient in Obvious Discomfort during Triage. Pain is Bilateral in nature. Patient complaining of Constant Nausea. No Fevers. BIB Wheelchair.

## 2021-02-10 NOTE — Discharge Instructions (Signed)
Begin taking naproxen as prescribed.  Begin taking hydrocodone as prescribed as needed for pain not relieved with naproxen.  Follow-up with your primary doctor if symptoms are not improving in the next week, and return to the ER if symptoms significantly worsen or change.

## 2021-02-13 ENCOUNTER — Emergency Department (HOSPITAL_COMMUNITY)
Admission: EM | Admit: 2021-02-13 | Discharge: 2021-02-13 | Disposition: A | Discharge status: Home / Self Care | Payer: Medicaid Other | Attending: Emergency Medicine | Admitting: Emergency Medicine

## 2021-02-13 ENCOUNTER — Other Ambulatory Visit: Payer: Self-pay

## 2021-02-13 DIAGNOSIS — M5442 Lumbago with sciatica, left side: Secondary | ICD-10-CM | POA: Diagnosis not present

## 2021-02-13 DIAGNOSIS — R109 Unspecified abdominal pain: Secondary | ICD-10-CM | POA: Diagnosis not present

## 2021-02-13 DIAGNOSIS — F17210 Nicotine dependence, cigarettes, uncomplicated: Secondary | ICD-10-CM | POA: Diagnosis not present

## 2021-02-13 DIAGNOSIS — M545 Low back pain, unspecified: Secondary | ICD-10-CM | POA: Diagnosis present

## 2021-02-13 DIAGNOSIS — J45909 Unspecified asthma, uncomplicated: Secondary | ICD-10-CM | POA: Insufficient documentation

## 2021-02-13 DIAGNOSIS — M5431 Sciatica, right side: Secondary | ICD-10-CM

## 2021-02-13 MED ORDER — DIPHENHYDRAMINE HCL 50 MG/ML IJ SOLN
25.0000 mg | Freq: Once | INTRAMUSCULAR | Status: AC
  Administered 2021-02-13: 25 mg via INTRAVENOUS
  Filled 2021-02-13: qty 1

## 2021-02-13 MED ORDER — METOCLOPRAMIDE HCL 10 MG PO TABS: 10.0000 mg | ORAL_TABLET | Freq: Four times a day (QID) | ORAL | 0 refills | Status: DC

## 2021-02-13 MED ORDER — KETOROLAC TROMETHAMINE 15 MG/ML IJ SOLN
15.0000 mg | Freq: Once | INTRAMUSCULAR | Status: AC
  Administered 2021-02-13: 15 mg via INTRAVENOUS
  Filled 2021-02-13: qty 1

## 2021-02-13 MED ORDER — OXYCODONE HCL 5 MG PO TABS
5.0000 mg | ORAL_TABLET | Freq: Once | ORAL | Status: AC
  Administered 2021-02-13: 5 mg via ORAL
  Filled 2021-02-13: qty 1

## 2021-02-13 MED ORDER — DIAZEPAM 5 MG PO TABS
5.0000 mg | ORAL_TABLET | Freq: Once | ORAL | Status: AC
  Administered 2021-02-13: 5 mg via ORAL
  Filled 2021-02-13: qty 1

## 2021-02-13 MED ORDER — METOCLOPRAMIDE HCL 5 MG/ML IJ SOLN
10.0000 mg | Freq: Once | INTRAMUSCULAR | Status: AC
  Administered 2021-02-13: 10 mg via INTRAVENOUS
  Filled 2021-02-13: qty 2

## 2021-02-13 NOTE — ED Provider Notes (Signed)
[facility] DEPT Provider Note   CSN: [id number] Arrival date & time: 02/13/21  0252     History Chief Complaint  Patient presents with   Abdominal Pain    Anna Moses is a 45 y.o. female.  45 yo F with a chief complaints of right-sided low back pain that radiates down the leg.  This has been going on for the past week.  She has been seen multiple times in the emergency department for this.  At her initial visit she had a CT stone study and blood work.  They told her that she had a stone in the kidney and she had symptoms concerning for peptic ulcer disease.  She was discharged home with medications but is concerned that they did not help her symptoms.  She had subsequent visits where she was reevaluated.  Most recently started on narcotics.  She feels that the narcotics made her feel funny.  Tells me that she has had a lot of trouble eating and drinking over the week.  The history is provided by the patient.  Abdominal Pain Pain quality: not aching   Associated symptoms: no chest pain, no chills, no dysuria, no fever, no nausea, no shortness of breath and no vomiting   Back Pain Location:  Lumbar spine Quality:  Shooting and stabbing Radiates to:  R knee Pain severity:  Severe Onset quality:  Gradual Duration:  1 week Timing:  Intermittent Progression:  Waxing and waning Chronicity:  New Context: not emotional stress   Relieved by:  Nothing Worsened by:  Palpation, touching and twisting Ineffective treatments:  None tried Associated symptoms: abdominal pain   Associated symptoms: no chest pain, no dysuria, no fever and no headaches       Past Medical History:  Diagnosis Date   Asthma    Bronchitis    Depression    Obesity    Schizophrenia ([place])     Patient Active Problem List   Diagnosis Date Noted   Contraception management 11/17/2020   Depression, recurrent ([place]) 11/17/2020   Effusion of left knee 08/15/2020   Cervical  cancer screening 09/18/2019   Routine screening for STI (sexually transmitted infection) 23/76/2831   Cyclical vomiting 51/76/1607   Schizophrenia ([place]) 07/24/2019   H. pylori infection 07/24/2019   Seasonal allergies 07/24/2019   Dysphagia 06/03/2019   Nausea and vomiting 06/03/2019   Liver lesion 06/03/2019   Preop examination 06/03/2019    Past Surgical History:  Procedure Laterality Date   CESAREAN SECTION     COLONOSCOPY  06/27/2019   UPPER GASTROINTESTINAL ENDOSCOPY  06/27/2019     OB History   No obstetric history on file.     Family History  Problem Relation Age of Onset   Crohn's disease Mother    Liver disease Father    Clotting disorder Father    Liver disease Brother    Colon cancer Neg Hx    Esophageal cancer Neg Hx    Rectal cancer Neg Hx    Stomach cancer Neg Hx     Social History   Tobacco Use   Smoking status: Every Day    Types: Cigarettes   Smokeless tobacco: Never  Vaping Use   Vaping Use: Never used  Substance Use Topics   Alcohol use: No   Drug use: Not Currently    Types: Marijuana    Home Medications Prior to Admission medications   Medication Sig Start Date End Date Taking? Authorizing Provider  metoCLOPramide (REGLAN)  10 MG tablet Take 1 tablet (10 mg total) by mouth every 6 (six) hours. 02/13/21  Yes Deno Etienne, DO  amoxicillin-clavulanate (AUGMENTIN) 875-125 MG tablet Take 1 tablet by mouth every 12 (twelve) hours. Patient not taking: No sig reported 08/01/20   Volney American, PA-C  dicyclomine (BENTYL) 20 MG tablet Take 1 tablet (20 mg total) by mouth 2 (two) times daily. 11/11/20   Kommor, Madison, MD  famotidine (PEPCID) 20 MG tablet Take 1 tablet (20 mg total) by mouth 2 (two) times daily. 11/11/20   Kommor, Madison, MD  HYDROcodone-acetaminophen (NORCO) 5-325 MG tablet Take 1-2 tablets by mouth every 6 (six) hours as needed. 02/10/21   Veryl Speak, MD  lidocaine (XYLOCAINE) 2 % solution Use as directed 10 mLs in the mouth  or throat as needed for mouth pain. Patient not taking: No sig reported 08/01/20   Volney American, PA-C  loratadine (CLARITIN REDITABS) 10 MG dissolvable tablet Take 1 tablet (10 mg total) by mouth daily. As needed for allergy symptoms Patient not taking: No sig reported 08/20/19   Benay Pike, MD  medroxyPROGESTERone (DEPO-PROVERA) 150 MG/ML injection Inject 150 mg into the muscle every 3 (three) months.    [provider]  naproxen (NAPROSYN) 500 MG tablet Take 1 tablet (500 mg total) by mouth 2 (two) times daily. 02/10/21   Veryl Speak, MD  ondansetron (ZOFRAN ODT) 4 MG disintegrating tablet 4mg  ODT q4 hours prn nausea/vomit Patient taking differently: Take 4 mg by mouth every 8 (eight) hours as needed for nausea or vomiting. 11/07/20   Deno Etienne, DO  pantoprazole (PROTONIX) 40 MG tablet Take 1 tablet (40 mg total) by mouth 2 (two) times daily before a meal. Patient not taking: No sig reported 11/09/20   Volney American, PA-C  pantoprazole (PROTONIX) 40 MG tablet Take 1 tablet (40 mg total) by mouth 2 (two) times daily for 14 days. 02/08/21 02/22/21  Tedd Sias, PA  risperiDONE (RISPERDAL) 1 MG tablet Take 1 mg by mouth at bedtime. Patient not taking: No sig reported    [provider]  sertraline (ZOLOFT) 50 MG tablet Take 50 mg by mouth daily. Patient not taking: No sig reported    [provider]  sucralfate (CARAFATE) 1 g tablet Take 1 tablet (1 g total) by mouth 4 (four) times daily -  with meals and at bedtime. Take 1 tablet into a glass of water and drink 02/08/21   Pati Gallo S, PA  diphenhydrAMINE (BENADRYL) 25 MG tablet Take 1 tablet (25 mg total) by mouth every 6 (six) hours. Patient not taking: Reported on 12/12/2018 10/21/17 05/30/19  Charlesetta Shanks, MD  Ibuprofen-diphenhydrAMINE Cit (ADVIL PM) 200-38 MG TABS Take 2 tablets by mouth 2 (two) times daily as needed (headaches).  05/30/19  [provider]    Allergies     Patient has no known allergies.  Review of Systems   Review of Systems  Constitutional:  Negative for chills and fever.  HENT:  Negative for congestion and rhinorrhea.   Eyes:  Negative for redness and visual disturbance.  Respiratory:  Negative for shortness of breath and wheezing.   Cardiovascular:  Negative for chest pain and palpitations.  Gastrointestinal:  Positive for abdominal pain. Negative for nausea and vomiting.  Genitourinary:  Negative for dysuria and urgency.  Musculoskeletal:  Positive for back pain. Negative for arthralgias and myalgias.  Skin:  Negative for pallor and wound.  Neurological:  Negative for dizziness and headaches.  Physical Exam Updated Vital Signs BP (!) 141/85   Pulse (!) 57   Temp 99.3 F (37.4 C) (Oral)   Resp 20   Ht 5\' 1"  (1.549 m)   Wt 112.5 kg   SpO2 100%   BMI 46.86 kg/m   Physical Exam Vitals and nursing note reviewed.  Constitutional:      General: She is not in acute distress.    Appearance: She is well-developed. She is not diaphoretic.  HENT:     Head: Normocephalic and atraumatic.  Eyes:     Pupils: Pupils are equal, round, and reactive to light.  Cardiovascular:     Rate and Rhythm: Normal rate and regular rhythm.     Heart sounds: No murmur heard.   No friction rub. No gallop.  Pulmonary:     Effort: Pulmonary effort is normal.     Breath sounds: No wheezing or rales.  Abdominal:     General: There is no distension.     Palpations: Abdomen is soft.     Tenderness: There is abdominal tenderness.     Comments: Mild right paraspinal musculature tenderness about the L-spine.  Pulse motor and sensation intact.  Reflexes are 2+ and equal.  No clonus.  No weakness.  Musculoskeletal:        General: No tenderness.     Cervical back: Normal range of motion and neck supple.  Skin:    General: Skin is warm and dry.  Neurological:     Mental Status: She is alert and oriented to person, place, and time.  Psychiatric:         Behavior: Behavior normal.    ED Results / Procedures / Treatments   Labs (all labs ordered are listed, but only abnormal results are displayed) Labs Reviewed - No data to display  EKG None  Radiology No results found.  Procedures Procedures   Medications Ordered in ED Medications  ketorolac (TORADOL) 15 MG/ML injection 15 mg (15 mg Intravenous Given 02/13/21 0331)  metoCLOPramide (REGLAN) injection 10 mg (10 mg Intravenous Given 02/13/21 0333)  diphenhydrAMINE (BENADRYL) injection 25 mg (25 mg Intravenous Given 02/13/21 0334)  oxyCODONE (Oxy IR/ROXICODONE) immediate release tablet 5 mg (5 mg Oral Given 02/13/21 0335)  diazepam (VALIUM) tablet 5 mg (5 mg Oral Given 02/13/21 0335)    ED Course  I have reviewed the triage vital signs and the nursing notes.  Pertinent labs & imaging results that were available during my care of the patient were reviewed by me and considered in my medical decision making (see chart for details).    MDM Rules/Calculators/A&P                           45 yo F with right-sided sciatica nausea and vomiting.  This been going on now for about a week.  She has had blood work as well as a CT scan.  She was able to tolerate by mouth here.  Treat pain and nausea here.  We will have her follow-up with her family doctor.  4:46 AM:  I have discussed the diagnosis/risks/treatment options with the patient and believe the pt to be eligible for discharge home to follow-up with PCP. We also discussed returning to the ED immediately if new or worsening sx occur. We discussed the sx which are most concerning (e.g., sudden worsening pain, fever, inability to tolerate by mouth, cauda equina s/sx) that necessitate immediate return. Medications administered to the patient  during their visit and any new prescriptions provided to the patient are listed below.  Medications given during this visit Medications  ketorolac (TORADOL) 15 MG/ML injection 15 mg (15 mg Intravenous  Given 02/13/21 0331)  metoCLOPramide (REGLAN) injection 10 mg (10 mg Intravenous Given 02/13/21 0333)  diphenhydrAMINE (BENADRYL) injection 25 mg (25 mg Intravenous Given 02/13/21 0334)  oxyCODONE (Oxy IR/ROXICODONE) immediate release tablet 5 mg (5 mg Oral Given 02/13/21 0335)  diazepam (VALIUM) tablet 5 mg (5 mg Oral Given 02/13/21 0335)     The patient appears reasonably screen and/or stabilized for discharge and I doubt any other medical condition or other [facility] requiring further screening, evaluation, or treatment in the ED at this time prior to discharge.   Final Clinical Impression(s) / ED Diagnoses Final diagnoses:  Sciatica of right side    Rx / DC Orders ED Discharge Orders          Ordered    metoCLOPramide (REGLAN) 10 MG tablet  Every 6 hours        02/13/21 0319             Deno Etienne, DO 02/13/21 [phone]

## 2021-02-13 NOTE — Discharge Instructions (Signed)

## 2021-02-13 NOTE — ED Triage Notes (Signed)
Pt seen on the 31st, 1st, and 2nd for same. Pt states she stopped taking the medicine she was prescribed because it made her feel worse. Pt has had n/v for the past week.  EMS gave 300 NS and 4 zofran IV en route.

## 2021-02-16 NOTE — Progress Notes (Deleted)
    SUBJECTIVE:   CHIEF COMPLAINT / HPI:   Flank pain: 45 year old female presenting for follow-up after being evaluated in the emergency department. CT scan performed on 10/31 showed a nonobstructing 2 mm left lower pole renal stone with no other acute findings.  PERTINENT  PMH / PSH: ***  OBJECTIVE:   There were no vitals taken for this visit. ***  General: NAD, pleasant, able to participate in exam Cardiac: RRR, no murmurs. Respiratory: CTAB, normal effort, No wheezes, rales or rhonchi Abdomen: Bowel sounds present, nontender, nondistended, no hepatosplenomegaly. Extremities: no edema or cyanosis. MSK: *** Neuro: alert, no obvious focal deficits Psych: Normal affect and mood  ASSESSMENT/PLAN:   No problem-specific Assessment & Plan notes found for this encounter.     Lurline Del, [place]    {    This will disappear when note is signed, click to select method of visit    :1}

## 2021-02-17 ENCOUNTER — Ambulatory Visit: Payer: Medicaid Other

## 2021-02-19 ENCOUNTER — Ambulatory Visit (INDEPENDENT_AMBULATORY_CARE_PROVIDER_SITE_OTHER): Payer: Medicaid Other | Admitting: Family Medicine

## 2021-02-19 ENCOUNTER — Other Ambulatory Visit: Payer: Self-pay | Admitting: Family Medicine

## 2021-02-19 ENCOUNTER — Telehealth: Payer: Self-pay | Admitting: Gastroenterology

## 2021-02-19 ENCOUNTER — Other Ambulatory Visit: Payer: Self-pay

## 2021-02-19 VITALS — BP 125/67 | HR 82 | Ht 61.0 in | Wt 235.8 lb

## 2021-02-19 DIAGNOSIS — R1310 Dysphagia, unspecified: Secondary | ICD-10-CM | POA: Diagnosis present

## 2021-02-19 MED ORDER — PANTOPRAZOLE SODIUM 40 MG PO TBEC: 40.0000 mg | DELAYED_RELEASE_TABLET | Freq: Two times a day (BID) | ORAL | 0 refills | Status: DC

## 2021-02-19 MED ORDER — FAMOTIDINE 20 MG PO TABS: 20.0000 mg | ORAL_TABLET | Freq: Two times a day (BID) | ORAL | 0 refills | Status: DC

## 2021-02-19 NOTE — Patient Instructions (Signed)
It was great seeing you today.  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  Regarding medications I want you to avoid naproxen.  I want you to continue taking the pantoprazole and I have sent a prescription for your famotidine in.  If you have any questions or concerns call the clinic.  If you notice any worsening blood in your stool please go be evaluated at the emergency department.  Hope you have a wonderful afternoon!

## 2021-02-19 NOTE — Progress Notes (Signed)
    SUBJECTIVE:   CHIEF COMPLAINT / HPI:   Swallowing difficulties Patient reports to our office for a follow-up from her ED visit on 10/31.  Reports she was told she had a bleeding peptic ulcer and was started on pantoprazole.  She says that she went to the hospital because she was having dark tarry stools as well as pain in her throat.  She had a positive FOBT with melena on DRE.  She reports that she has seen GI in the past and recently had a colonoscopy as well as EGD (Per chart review it looks like it was a year and a half ago).  She reports that she cannot remember which GI doctor she saw.  Reports that since being seen in the hospital she has not had any melena but has had difficulty swallowing.  She is only able to take down liquids and any solids get stuck in her throat and she throws it up.  Reports weight loss since this is occurred and is drinking high-calorie drinks to try and help with her weight.  OBJECTIVE:   BP 125/67   Pulse 82   Ht 5\' 1"  (1.549 m)   Wt 235 lb 12.8 oz (107 kg)   SpO2 100%   BMI 44.55 kg/m   Neuro: Pleasant appearing 45 year old female in no acute distress Cardiac: Regular rate and rhythm Respiratory: Normal work of breathing Abdomen: Soft, nontender, is able to point to epigastric region which she reports she feels the pain when she tries to eat.  She has normoactive bowel sounds MSK: No gross abnormalities  ASSESSMENT/PLAN:   Dysphagia Considerable symptoms noted since being evaluated in the emergency department.  Reports that she is able to take liquids but unable to take solids and that they get stuck in her throat and she will vomit them up.  Has been able to keep down medications and has been taking pantoprazole but has not been taking famotidine.  Prescriptions for famotidine as well as pantoprazole refilled.  Instructed the patient to contact her GI doctor for urgent visit for evaluation possible dilation.  No further questions or concerns.   Discussed strict ED precautions.     Gifford Shave, MD [place]

## 2021-02-19 NOTE — Telephone Encounter (Signed)
Patient called this morning.  She states she can only drink liquids, cannot digest solid food, that it reaches the top of her stomach then comes back up and she is having rectal bleeding as well.  Appointment scheduled for 12/14, but didn't know if she could be seen sooner?  Please call patient and advise.  Thank you.

## 2021-02-19 NOTE — Assessment & Plan Note (Signed)
Considerable symptoms noted since being evaluated in the emergency department.  Reports that she is able to take liquids but unable to take solids and that they get stuck in her throat and she will vomit them up.  Has been able to keep down medications and has been taking pantoprazole but has not been taking famotidine.  Prescriptions for famotidine as well as pantoprazole refilled.  Instructed the patient to contact her GI doctor for urgent visit for evaluation possible dilation.  No further questions or concerns.  Discussed strict ED precautions.

## 2021-02-19 NOTE — Telephone Encounter (Signed)
Patient has been rescheduled to 11/21 with Amy Esterwood PA

## 2021-02-19 NOTE — Telephone Encounter (Signed)
Attempted to return call.  No answer and no VM

## 2021-02-21 ENCOUNTER — Emergency Department (HOSPITAL_COMMUNITY)
Admission: EM | Admit: 2021-02-21 | Discharge: 2021-02-21 | Disposition: A | Discharge status: Home / Self Care | Payer: Medicaid Other | Attending: Emergency Medicine | Admitting: Emergency Medicine

## 2021-02-21 ENCOUNTER — Encounter (HOSPITAL_COMMUNITY): Payer: Self-pay | Admitting: Emergency Medicine

## 2021-02-21 ENCOUNTER — Emergency Department (HOSPITAL_COMMUNITY): Payer: Medicaid Other

## 2021-02-21 DIAGNOSIS — K5900 Constipation, unspecified: Secondary | ICD-10-CM | POA: Insufficient documentation

## 2021-02-21 DIAGNOSIS — J45909 Unspecified asthma, uncomplicated: Secondary | ICD-10-CM | POA: Insufficient documentation

## 2021-02-21 DIAGNOSIS — F17210 Nicotine dependence, cigarettes, uncomplicated: Secondary | ICD-10-CM | POA: Insufficient documentation

## 2021-02-21 LAB — CBC WITH DIFFERENTIAL/PLATELET
Abs Immature Granulocytes: 0.01 10*3/uL (ref 0.00–0.07)
Basophils Absolute: 0.1 10*3/uL (ref 0.0–0.1)
Basophils Relative: 1 %
Eosinophils Absolute: 0 10*3/uL (ref 0.0–0.5)
Eosinophils Relative: 1 %
HCT: 39.6 % (ref 36.0–46.0)
Hemoglobin: 12.9 g/dL (ref 12.0–15.0)
Immature Granulocytes: 0 %
Lymphocytes Relative: 51 %
Lymphs Abs: 4 10*3/uL (ref 0.7–4.0)
MCH: 28.5 pg (ref 26.0–34.0)
MCHC: 32.6 g/dL (ref 30.0–36.0)
MCV: 87.4 fL (ref 80.0–100.0)
Monocytes Absolute: 0.4 10*3/uL (ref 0.1–1.0)
Monocytes Relative: 5 %
Neutro Abs: 3.3 10*3/uL (ref 1.7–7.7)
Neutrophils Relative %: 42 %
Platelets: 418 10*3/uL — ABNORMAL HIGH (ref 150–400)
RBC: 4.53 MIL/uL (ref 3.87–5.11)
RDW: 14 % (ref 11.5–15.5)
WBC: 7.8 10*3/uL (ref 4.0–10.5)
nRBC: 0 % (ref 0.0–0.2)

## 2021-02-21 LAB — URINALYSIS, ROUTINE W REFLEX MICROSCOPIC
Bilirubin Urine: NEGATIVE
Glucose, UA: NEGATIVE mg/dL
Hgb urine dipstick: NEGATIVE
Ketones, ur: 5 mg/dL — AB
Leukocytes,Ua: NEGATIVE
Nitrite: NEGATIVE
Protein, ur: NEGATIVE mg/dL
Specific Gravity, Urine: 1.017 (ref 1.005–1.030)
pH: 7 (ref 5.0–8.0)

## 2021-02-21 LAB — COMPREHENSIVE METABOLIC PANEL
ALT: 24 U/L (ref 0–44)
AST: 14 U/L — ABNORMAL LOW (ref 15–41)
Albumin: 4 g/dL (ref 3.5–5.0)
Alkaline Phosphatase: 50 U/L (ref 38–126)
Anion gap: 6 (ref 5–15)
BUN: 9 mg/dL (ref 6–20)
CO2: 21 mmol/L — ABNORMAL LOW (ref 22–32)
Calcium: 8.9 mg/dL (ref 8.9–10.3)
Chloride: 112 mmol/L — ABNORMAL HIGH (ref 98–111)
Creatinine, Ser: 0.83 mg/dL (ref 0.44–1.00)
GFR, Estimated: >60 mL/min (ref >60)
Glucose, Bld: 94 mg/dL (ref 70–99)
Potassium: 3.7 mmol/L (ref 3.5–5.1)
Sodium: 139 mmol/L (ref 135–145)
Total Bilirubin: 0.6 mg/dL (ref 0.3–1.2)
Total Protein: 7.2 g/dL (ref 6.5–8.1)

## 2021-02-21 LAB — LIPASE, BLOOD: Lipase: 26 U/L (ref 11–51)

## 2021-02-21 MED ORDER — LIDOCAINE VISCOUS HCL 2 % MT SOLN
15.0000 mL | Freq: Once | OROMUCOSAL | Status: AC
  Administered 2021-02-21: 15 mL via ORAL
  Filled 2021-02-21: qty 15

## 2021-02-21 MED ORDER — GLYCERIN (LAXATIVE) 2.1 G RE SUPP
1.0000 | Freq: Once | RECTAL | Status: AC
  Administered 2021-02-21: 1 via RECTAL
  Filled 2021-02-21: qty 1

## 2021-02-21 MED ORDER — POLYETHYLENE GLYCOL 3350 17 G PO PACK
17.0000 g | PACK | Freq: Every day | ORAL | Status: DC
  Administered 2021-02-21: 17 g via ORAL
  Filled 2021-02-21: qty 1

## 2021-02-21 MED ORDER — ALUM & MAG HYDROXIDE-SIMETH 200-200-20 MG/5ML PO SUSP
30.0000 mL | Freq: Once | ORAL | Status: AC
  Administered 2021-02-21: 30 mL via ORAL
  Filled 2021-02-21: qty 30

## 2021-02-21 NOTE — ED Provider Notes (Signed)
[facility] DEPT Provider Note   CSN: [id number] Arrival date & time: 02/21/21  1414     History Chief Complaint  Patient presents with   Sore Throat    Anna Moses is a 45 y.o. female.  45 year old female presents with complaint of difficulty swallowing.  Patient states that started at the end of October, originally which is having difficulty swallowing solid foods, now is unable to swallow liquid foods as she feels that she is still constipated she is full all the way up to her throat.  Patient tried drinking MiraLAX this morning however she was unable to keep it down due to complaint as above.  Denies abdominal pain.  Reports last bowel movement today as small rabbit pellets.  Patient is scheduled to follow-up with her GI later this month for same.  Patient followed up with her PCP this week, was given Ensure which she is unable to keep down.  Denies abdominal pain, fevers, chills, nausea.      Past Medical History:  Diagnosis Date   Asthma    Bronchitis    Depression    Obesity    Schizophrenia [facility])     Patient Active Problem List   Diagnosis Date Noted   Contraception management 11/17/2020   Depression, recurrent ([place]) 11/17/2020   Effusion of left knee 08/15/2020   Cervical cancer screening 09/18/2019   Routine screening for STI (sexually transmitted infection) 99/24/2683   Cyclical vomiting 41/96/2229   Schizophrenia ([place]) 07/24/2019   H. pylori infection 07/24/2019   Seasonal allergies 07/24/2019   Dysphagia 06/03/2019   Nausea and vomiting 06/03/2019   Liver lesion 06/03/2019   Preop examination 06/03/2019    Past Surgical History:  Procedure Laterality Date   CESAREAN SECTION     COLONOSCOPY  06/27/2019   UPPER GASTROINTESTINAL ENDOSCOPY  06/27/2019     OB History   No obstetric history on file.     Family History  Problem Relation Age of Onset   Crohn's disease Mother    Liver disease Father    Clotting  disorder Father    Liver disease Brother    Colon cancer Neg Hx    Esophageal cancer Neg Hx    Rectal cancer Neg Hx    Stomach cancer Neg Hx     Social History   Tobacco Use   Smoking status: Every Day    Types: Cigarettes   Smokeless tobacco: Never  Vaping Use   Vaping Use: Never used  Substance Use Topics   Alcohol use: No   Drug use: Not Currently    Types: Marijuana    Home Medications Prior to Admission medications   Medication Sig Start Date End Date Taking? Authorizing Provider  amoxicillin-clavulanate (AUGMENTIN) 875-125 MG tablet Take 1 tablet by mouth every 12 (twelve) hours. Patient not taking: No sig reported 08/01/20   Volney American, PA-C  dicyclomine (BENTYL) 20 MG tablet Take 1 tablet (20 mg total) by mouth 2 (two) times daily. 11/11/20   Kommor, Madison, MD  famotidine (PEPCID) 20 MG tablet Take 1 tablet (20 mg total) by mouth 2 (two) times daily. 02/19/21   Gifford Shave, MD  HYDROcodone-acetaminophen (NORCO) 5-325 MG tablet Take 1-2 tablets by mouth every 6 (six) hours as needed. 02/10/21   Veryl Speak, MD  lidocaine (XYLOCAINE) 2 % solution Use as directed 10 mLs in the mouth or throat as needed for mouth pain. Patient not taking: No sig reported 08/01/20   Merrie Roof  Elizabeth, PA-C  loratadine (CLARITIN REDITABS) 10 MG dissolvable tablet Take 1 tablet (10 mg total) by mouth daily. As needed for allergy symptoms Patient not taking: No sig reported 08/20/19   Benay Pike, MD  medroxyPROGESTERone (DEPO-PROVERA) 150 MG/ML injection Inject 150 mg into the muscle every 3 (three) months.    [provider]  metoCLOPramide (REGLAN) 10 MG tablet Take 1 tablet (10 mg total) by mouth every 6 (six) hours. 02/13/21   Deno Etienne, DO  naproxen (NAPROSYN) 500 MG tablet Take 1 tablet (500 mg total) by mouth 2 (two) times daily. 02/10/21   Veryl Speak, MD  ondansetron (ZOFRAN ODT) 4 MG disintegrating tablet 4mg  ODT q4 hours prn nausea/vomit Patient  taking differently: Take 4 mg by mouth every 8 (eight) hours as needed for nausea or vomiting. 11/07/20   Deno Etienne, DO  pantoprazole (PROTONIX) 40 MG tablet Take 1 tablet (40 mg total) by mouth 2 (two) times daily for 14 days. 02/08/21 02/22/21  Tedd Sias, PA  pantoprazole (PROTONIX) 40 MG tablet TAKE 1 TABLET(40 MG) BY MOUTH TWICE DAILY BEFORE A MEAL 02/21/21   Carollee Leitz, MD  risperiDONE (RISPERDAL) 1 MG tablet Take 1 mg by mouth at bedtime. Patient not taking: No sig reported    [provider]  sertraline (ZOLOFT) 50 MG tablet Take 50 mg by mouth daily. Patient not taking: No sig reported    [provider]  sucralfate (CARAFATE) 1 g tablet Take 1 tablet (1 g total) by mouth 4 (four) times daily -  with meals and at bedtime. Take 1 tablet into a glass of water and drink 02/08/21   Pati Gallo S, PA  diphenhydrAMINE (BENADRYL) 25 MG tablet Take 1 tablet (25 mg total) by mouth every 6 (six) hours. Patient not taking: Reported on 12/12/2018 10/21/17 05/30/19  Charlesetta Shanks, MD  Ibuprofen-diphenhydrAMINE Cit (ADVIL PM) 200-38 MG TABS Take 2 tablets by mouth 2 (two) times daily as needed (headaches).  05/30/19  [provider]    Allergies    Patient has no known allergies.  Review of Systems   Review of Systems  Constitutional:  Negative for appetite change and fever.  Respiratory:  Negative for shortness of breath.   Cardiovascular:  Negative for chest pain.  Gastrointestinal:  Positive for constipation and vomiting. Negative for abdominal pain, diarrhea and nausea.  Genitourinary:  Negative for decreased urine volume, difficulty urinating and dysuria.  Skin:  Negative for rash and wound.  Allergic/Immunologic: Negative for immunocompromised state.  Neurological:  Negative for weakness.  Psychiatric/Behavioral:  Negative for confusion.   All other systems reviewed and are negative.  Physical Exam Updated Vital Signs BP (!) 141/93   Pulse 69    Temp 99.1 F (37.3 C) (Oral)   Resp 12   SpO2 100%   Physical Exam Vitals and nursing note reviewed.  Constitutional:      General: She is not in acute distress.    Appearance: She is well-developed. She is not diaphoretic.  HENT:     Head: Normocephalic and atraumatic.  Eyes:     Conjunctiva/sclera: Conjunctivae normal.  Cardiovascular:     Rate and Rhythm: Normal rate and regular rhythm.     Heart sounds: Normal heart sounds.  Pulmonary:     Effort: Pulmonary effort is normal.     Breath sounds: Normal breath sounds.  Abdominal:     General: Bowel sounds are normal.     Palpations: Abdomen is soft.  Tenderness: There is no abdominal tenderness.  Musculoskeletal:     Cervical back: Neck supple.  Skin:    General: Skin is warm and dry.     Findings: No erythema or rash.  Neurological:     Mental Status: She is alert and oriented to person, place, and time.  Psychiatric:        Behavior: Behavior normal.    ED Results / Procedures / Treatments   Labs (all labs ordered are listed, but only abnormal results are displayed) Labs Reviewed  CBC WITH DIFFERENTIAL/PLATELET - Abnormal; Notable for the following components:      Result Value   Platelets 418 (*)    All other components within normal limits  COMPREHENSIVE METABOLIC PANEL - Abnormal; Notable for the following components:   Chloride 112 (*)    CO2 21 (*)    AST 14 (*)    All other components within normal limits  URINALYSIS, ROUTINE W REFLEX MICROSCOPIC - Abnormal; Notable for the following components:   Ketones, ur 5 (*)    All other components within normal limits  LIPASE, BLOOD    EKG None  Radiology DG Abd Acute W/Chest  Result Date: 02/21/2021 CLINICAL DATA:  Throat discomfort, constipation EXAM: DG ABDOMEN ACUTE WITH 1 VIEW CHEST COMPARISON:  None. FINDINGS: There is no evidence of dilated bowel loops or free intraperitoneal air. No radiopaque calculi or other significant radiographic  abnormality is seen. No excessive stool burden. Heart size and mediastinal contours are within normal limits. Both lungs are clear. IMPRESSION: Negative abdominal radiographs.  No acute cardiopulmonary disease. Electronically Signed   By: Rolm Baptise M.D.   On: 02/21/2021 19:17    Procedures Procedures   Medications Ordered in ED Medications  polyethylene glycol (MIRALAX / GLYCOLAX) packet 17 g (17 g Oral Given 02/21/21 2100)  alum & mag hydroxide-simeth (MAALOX/MYLANTA) 200-200-20 MG/5ML suspension 30 mL (30 mLs Oral Given 02/21/21 1459)    And  lidocaine (XYLOCAINE) 2 % viscous mouth solution 15 mL (15 mLs Oral Given 02/21/21 1459)  Glycerin (Adult) 2.1 g suppository 1 suppository (1 suppository Rectal Given 02/21/21 1923)    ED Course  I have reviewed the triage vital signs and the nursing notes.  Pertinent labs & imaging results that were available during my care of the patient were reviewed by me and considered in my medical decision making (see chart for details).  Clinical Course as of 02/21/21 2154  Nancy Fetter [birth year]  5137 45 year old female with complaint of feeling like she is so constipated she is now backed up into her throat.  Reports attempting liquid diet without success. On exam, well appearing, tolerating secretions. Abdomen is soft and non tender with normal bowel sounds.   Abdominal x-ray is unremarkable, normal bowel pattern. Labs are reassuring with CBC, CMP, lipase and urinalysis without significant findings, no significant electrolyte derangement, normal hepatic and renal function. Patient was given glycerin suppository as well as p.o. challenge with MiraLAX.  Patient drinks without difficulty.  Patient is discharged to follow-up with her GI, continue management of symptoms at home, recommend soft foods or liquid diet as tolerated. [LM]    Clinical Course User Index [LM] Roque Lias   MDM Rules/Calculators/A&P                           Final  Clinical Impression(s) / ED Diagnoses Final diagnoses:  Constipation, unspecified constipation type  Rx / DC Orders ED Discharge Orders     None        Roque Lias 02/21/21 2154    Daleen Bo, MD 02/22/21 1236

## 2021-02-21 NOTE — ED Notes (Signed)
Pt refused to sign for d/c

## 2021-02-21 NOTE — ED Triage Notes (Signed)
Per EMS, patient from home, discomfort in throat x2 weeks, worsening x2 days. Reports nausea. Stopped taking prescribed medications. Decreased PO intake. States "I am full to capacity." Also reports constipation with last BM "rabbit pellets today."

## 2021-02-21 NOTE — ED Provider Notes (Signed)
Emergency Medicine Provider Triage Evaluation Note  Anna Moses , a 45 y.o. female  was evaluated in triage.  Pt complains of dysphagia.  Review of Systems  Positive: Difficulty swallowing, discomfort to midsternal and epigastric, feeling weak Negative: Fever, productive cough, vomiting, constipation  Physical Exam  BP (!) 150/100 (BP Location: Left Arm)   Pulse 79   Temp 99.5 F (37.5 C) (Oral)   Resp 16   SpO2 98%  Gen:   Awake, no distress   Resp:  Normal effort  MSK:   Moves extremities without difficulty  Other:    Medical Decision Making  Medically screening exam initiated at 2:47 PM.  Appropriate orders placed.  Anna Moses was informed that the remainder of the evaluation will be completed by another provider, this initial triage assessment does not replace that evaluation, and the importance of remaining in the ED until their evaluation is complete.  Report discomfort to midsternal and epigastric region x 2 weeks, having trouble swallowing and feels as if her food is stuck.  No cold sxs.     Domenic Moras, PA-C 02/21/21 1452    Tegeler, Gwenyth Allegra, MD 02/21/21 1944

## 2021-02-21 NOTE — Discharge Instructions (Signed)
Follow up with your GI as scheduled. Soft food diet as tolerated.

## 2021-03-01 ENCOUNTER — Encounter: Payer: Self-pay | Admitting: Physician Assistant

## 2021-03-01 ENCOUNTER — Ambulatory Visit (INDEPENDENT_AMBULATORY_CARE_PROVIDER_SITE_OTHER): Payer: Medicaid Other | Admitting: Physician Assistant

## 2021-03-01 VITALS — BP 114/80 | HR 95 | Ht 61.0 in | Wt 234.0 lb

## 2021-03-01 DIAGNOSIS — Z86010 Personal history of colonic polyps: Secondary | ICD-10-CM | POA: Diagnosis not present

## 2021-03-01 DIAGNOSIS — K219 Gastro-esophageal reflux disease without esophagitis: Secondary | ICD-10-CM | POA: Diagnosis not present

## 2021-03-01 DIAGNOSIS — R1310 Dysphagia, unspecified: Secondary | ICD-10-CM

## 2021-03-01 DIAGNOSIS — K5900 Constipation, unspecified: Secondary | ICD-10-CM | POA: Diagnosis not present

## 2021-03-01 NOTE — Progress Notes (Signed)
Reviewed and agree with management plan.  Maddison Kilner T. Evani Shrider, MD FACG 

## 2021-03-01 NOTE — Patient Instructions (Addendum)
If you are age 43 or younger, your body mass index should be between 19-25. Your Body mass index is 44.21 kg/m. If this is out of the aformentioned range listed, please consider follow up with your Primary Care Provider.  ________________________________________________________  The [facility] providers would like to encourage you to use [facility] to communicate with providers for non-urgent requests or questions.  Due to long hold times on the telephone, sending your provider a message by [facility] may be a faster and more efficient way to get a response.  Please allow 48 business hours for a response.  Please remember that this is for non-urgent requests.  _______________________________________________________  Anna Moses have been scheduled for a Barium Esophogram at [facility] Radiology (1st floor of the hospital) on 03/11/2021 at 11:00 am. Please arrive 15 minutes prior to your appointment for registration. Make certain not to have anything to eat or drink 3 hours prior to your test. If you need to reschedule for any reason, please contact radiology at [phone] to do so. __________________________________________________________________ A barium swallow is an examination that concentrates on views of the esophagus. This tends to be a double contrast exam (barium and two liquids which, when combined, create a gas to distend the wall of the oesophagus) or single contrast (non-ionic iodine based). The study is usually tailored to your symptoms so a good history is essential. Attention is paid during the study to the form, structure and configuration of the esophagus, looking for functional disorders (such as aspiration, dysphagia, achalasia, motility and reflux) EXAMINATION You may be asked to change into a gown, depending on the type of swallow being performed. A radiologist and radiographer will perform the procedure. The radiologist will advise you of the type of contrast selected for your procedure and  direct you during the exam. You will be asked to stand, sit or lie in several different positions and to hold a small amount of fluid in your mouth before being asked to swallow while the imaging is performed .In some instances you may be asked to swallow barium coated marshmallows to assess the motility of a solid food bolus. The exam can be recorded as a digital or video fluoroscopy procedure. POST PROCEDURE It will take 1-2 days for the barium to pass through your system. To facilitate this, it is important, unless otherwise directed, to increase your fluids for the next 24-48hrs and to resume your normal diet.  This test typically takes about 30 minutes to perform. __________________________________________________________________________________  Take Miralax 1 capful in 8 ounces of water or juice daily every other day as needed for constipation.  Continue Pantoprazole 40 mg twice daily.  Advance your diet to very soft foods (mashed potatoes, scrambled eggs, etc.)  You have been scheduled to follow up with Nicoletta Ba, PA-C on March 29, 2021 at 2:00 pm.  Thank you for entrusting me with your care and choosing [facility].  Amy Esterwood, PA-C

## 2021-03-01 NOTE — Progress Notes (Signed)
Subjective:    Patient ID: Anna Moses, female    DOB: [birth year], 45 y.o.   MRN: [id number]  HPI Merlene is a pleasant 45 year old African-American female, established with Dr. Fuller Plan, who was last seen in our office in February 2021.  She comes in today with complaints of dysphagia.  She says she has not been able to swallow solid foods and has primarily been on liquids and drinking Ensure twice daily over the past couple of weeks.  She has no complaints of odynophagia.  She says that she feels like everything "is getting stuck" in her throat and esophagus.  No current complaints of abdominal pain. She has had multiple ER visits since August 2022, and was most recently in the emergency room on 02/21/2021 with complaints of constipation.  She says she has taken some MiraLAX and bowels are now moving much better and that she currently does not feel constipated.  She says that she was told that she "had a bleeding ulcer" with one of her recent ER visits. CT of the abdomen pelvis on November 16, 2020 showed normal gallbladder, normal liver and pancreas, she has uterine fibroids With ER visit on 02/08/2021 noted to be heme positive, drug screen positive for THC, WBC 7.8/hemoglobin 12.9/Mehta crit 39.6  She did undergo EGD and colonoscopy in March 2021.  At colonoscopy she had 4 sessile polyps removed all 5 to 8 mm in size and 3 of those were adenomatous polyps.  She is indicated for follow-up in March 2024 At EGD esophagus was normal but due to complaints of dysphagia she had empiric dilation to 16 mm and was noted to have diffuse mild gastritis in the gastric body.  Biopsies positive for H. pylori and treated.  She has history of schizophrenia and depression.  Review of Systems Pertinent positive and negative review of systems were noted in the above HPI section.  All other review of systems was otherwise negative.   Outpatient Encounter Medications as of 03/01/2021  Medication Sig    pantoprazole (PROTONIX) 40 MG tablet TAKE 1 TABLET(40 MG) BY MOUTH TWICE DAILY BEFORE A MEAL   amoxicillin-clavulanate (AUGMENTIN) 875-125 MG tablet Take 1 tablet by mouth every 12 (twelve) hours. (Patient not taking: Reported on 11/11/2020)   dicyclomine (BENTYL) 20 MG tablet Take 1 tablet (20 mg total) by mouth 2 (two) times daily. (Patient not taking: Reported on 03/01/2021)   HYDROcodone-acetaminophen (NORCO) 5-325 MG tablet Take 1-2 tablets by mouth every 6 (six) hours as needed. (Patient not taking: Reported on 03/01/2021)   lidocaine (XYLOCAINE) 2 % solution Use as directed 10 mLs in the mouth or throat as needed for mouth pain. (Patient not taking: Reported on 11/11/2020)   loratadine (CLARITIN REDITABS) 10 MG dissolvable tablet Take 1 tablet (10 mg total) by mouth daily. As needed for allergy symptoms (Patient not taking: Reported on 09/04/2019)   medroxyPROGESTERone (DEPO-PROVERA) 150 MG/ML injection Inject 150 mg into the muscle every 3 (three) months. (Patient not taking: Reported on 03/01/2021)   ondansetron (ZOFRAN ODT) 4 MG disintegrating tablet 4mg  ODT q4 hours prn nausea/vomit (Patient not taking: Reported on 03/01/2021)   risperiDONE (RISPERDAL) 1 MG tablet Take 1 mg by mouth at bedtime. (Patient not taking: Reported on 11/11/2020)   sertraline (ZOLOFT) 50 MG tablet Take 50 mg by mouth daily. (Patient not taking: Reported on 11/11/2020)   [DISCONTINUED] diphenhydrAMINE (BENADRYL) 25 MG tablet Take 1 tablet (25 mg total) by mouth every 6 (six) hours. (Patient not taking: Reported on  12/12/2018)   [DISCONTINUED] famotidine (PEPCID) 20 MG tablet Take 1 tablet (20 mg total) by mouth 2 (two) times daily. (Patient not taking: Reported on 03/01/2021)   [DISCONTINUED] Ibuprofen-diphenhydrAMINE Cit (ADVIL PM) 200-38 MG TABS Take 2 tablets by mouth 2 (two) times daily as needed (headaches).   [DISCONTINUED] metoCLOPramide (REGLAN) 10 MG tablet Take 1 tablet (10 mg total) by mouth every 6 (six) hours.  (Patient not taking: Reported on 03/01/2021)   [DISCONTINUED] naproxen (NAPROSYN) 500 MG tablet Take 1 tablet (500 mg total) by mouth 2 (two) times daily. (Patient not taking: Reported on 03/01/2021)   [DISCONTINUED] pantoprazole (PROTONIX) 40 MG tablet Take 1 tablet (40 mg total) by mouth 2 (two) times daily for 14 days.   [DISCONTINUED] sucralfate (CARAFATE) 1 g tablet Take 1 tablet (1 g total) by mouth 4 (four) times daily -  with meals and at bedtime. Take 1 tablet into a glass of water and drink (Patient not taking: Reported on 03/01/2021)   No facility-administered encounter medications on file as of 03/01/2021.   No Known Allergies Patient Active Problem List   Diagnosis Date Noted   Contraception management 11/17/2020   Depression, recurrent ([place]) 11/17/2020   Effusion of left knee 08/15/2020   Cervical cancer screening 09/18/2019   Routine screening for STI (sexually transmitted infection) 51/05/5850   Cyclical vomiting 77/82/4235   Schizophrenia ([place]) 07/24/2019   H. pylori infection 07/24/2019   Seasonal allergies 07/24/2019   Dysphagia 06/03/2019   Nausea and vomiting 06/03/2019   Liver lesion 06/03/2019   Preop examination 06/03/2019   Social History   Socioeconomic History   Marital status: Single    Spouse name: Not on file   Number of children: 4   Years of education: Not on file   Highest education level: Not on file  Occupational History   Occupation: stay at home mother  Tobacco Use   Smoking status: Every Day    Types: Cigarettes   Smokeless tobacco: Never  Vaping Use   Vaping Use: Never used  Substance and Sexual Activity   Alcohol use: No   Drug use: Not Currently    Types: Marijuana   Sexual activity: Not on file  Other Topics Concern   Not on file  Social History Narrative   Not on file   Social Determinants of Health   Financial Resource Strain: Not on file  Food Insecurity: Not on file  Transportation Needs: Not on file  Physical  Activity: Not on file  Stress: Not on file  Social Connections: Not on file  Intimate Partner Violence: Not on file    Ms. Corron's family history includes Clotting disorder in her father; Crohn's disease in her mother; Liver disease in her brother and father.      Objective:    Vitals:   03/01/21 1335  BP: 114/80  Pulse: 95    Physical Exam Well-developed well-nourished African-American female in no acute distress.  Weight, 234 BMI 44.2  HEENT; nontraumatic normocephalic, EOMI, PE R LA, sclera anicteric. Oropharynx; no obvious oral thrush Neck; supple, no JVD Cardiovascular; regular rate and rhythm with S1-S2, no murmur rub or gallop Pulmonary; Clear bilaterally Abdomen; soft, nontender, nondistended, no palpable mass or hepatosplenomegaly, bowel sounds are active Rectal; not done today Skin; benign exam, no jaundice rash or appreciable lesions Extremities; no clubbing cyanosis or edema skin warm and dry Neuro/Psych; alert and oriented x4, grossly nonfocal mood and affect appropriate        Assessment & Plan:   #  25 45 year old African-American female with several week history of dysphagia to solids. Etiology not clear, EGD in March 2021 also with complaints of dysphagia, normal-appearing esophagus but empirically dilated to 16 mm  Rule out esophageal stricture, rule out esophageal ring, rule out dysmotility  #2 history of H. pylori gastritis 2021-treated #3 history of adenomatous colon polyps-up-to-date with colonoscopy due for follow-up March 2024 #4 constipation- resolved #5 heme positive stool with ER visit October 2022, no anemia  #6 schizophrenia # 7.  Depression #8 left renal stone-2 mm  Plan; patient will be scheduled for barium swallow with tablet. Decision regarding repeat EGD will be based on findings at barium swallow. Patient is primarily been consuming liquids and Ensure, she was encouraged to advance her diet to a soft diet today, and we discussed  options. We will plan to follow her up in the office in 4 to 5 weeks, at this point I do not think she needs to have repeat colonoscopy for an isolated heme positive stool.  We will repeat hemoglobin at that time.  Tiney Zipper Genia Harold PA-C 03/01/2021   Cc: Carollee Leitz, MD

## 2021-03-11 ENCOUNTER — Ambulatory Visit (HOSPITAL_COMMUNITY)
Admission: RE | Admit: 2021-03-11 | Discharge: 2021-03-11 | Disposition: A | Discharge status: Home / Self Care | Payer: Medicaid Other | Source: Ambulatory Visit | Attending: Physician Assistant | Admitting: Physician Assistant

## 2021-03-11 DIAGNOSIS — K219 Gastro-esophageal reflux disease without esophagitis: Secondary | ICD-10-CM | POA: Diagnosis present

## 2021-03-11 DIAGNOSIS — R1310 Dysphagia, unspecified: Secondary | ICD-10-CM | POA: Insufficient documentation

## 2021-03-24 ENCOUNTER — Ambulatory Visit: Payer: Medicaid Other | Admitting: Gastroenterology

## 2021-03-29 ENCOUNTER — Ambulatory Visit: Payer: Medicaid Other | Admitting: Physician Assistant

## 2021-04-20 ENCOUNTER — Other Ambulatory Visit: Payer: Self-pay

## 2021-04-20 ENCOUNTER — Ambulatory Visit (INDEPENDENT_AMBULATORY_CARE_PROVIDER_SITE_OTHER): Payer: Medicaid Other

## 2021-04-20 DIAGNOSIS — Z3042 Encounter for surveillance of injectable contraceptive: Secondary | ICD-10-CM | POA: Diagnosis not present

## 2021-04-20 MED ORDER — MEDROXYPROGESTERONE ACETATE 150 MG/ML IM SUSP
150.0000 mg | Freq: Once | INTRAMUSCULAR | Status: AC
  Administered 2021-04-20: 150 mg via INTRAMUSCULAR

## 2021-04-20 NOTE — Progress Notes (Signed)
Patient here today for Depo Provera injection and is within her dates.    Last contraceptive appt was 11/17/2020  Depo given in [place] today.  Site unremarkable & patient tolerated injection.    Next injection due 3/28-4/02/2022.  Reminder card given.    Talbot Grumbling, RN

## 2021-04-21 ENCOUNTER — Encounter: Payer: Self-pay | Admitting: Physician Assistant

## 2021-04-21 ENCOUNTER — Ambulatory Visit: Payer: Medicaid Other | Admitting: Physician Assistant

## 2021-04-21 ENCOUNTER — Other Ambulatory Visit (INDEPENDENT_AMBULATORY_CARE_PROVIDER_SITE_OTHER): Payer: Medicaid Other

## 2021-04-21 VITALS — BP 124/72 | HR 76 | Ht 61.0 in | Wt 236.4 lb

## 2021-04-21 DIAGNOSIS — R1310 Dysphagia, unspecified: Secondary | ICD-10-CM | POA: Diagnosis not present

## 2021-04-21 DIAGNOSIS — K219 Gastro-esophageal reflux disease without esophagitis: Secondary | ICD-10-CM

## 2021-04-21 DIAGNOSIS — Z86010 Personal history of colonic polyps: Secondary | ICD-10-CM

## 2021-04-21 LAB — CBC WITH DIFFERENTIAL/PLATELET
Basophils Absolute: 0.1 10*3/uL (ref 0.0–0.1)
Basophils Relative: 1.2 % (ref 0.0–3.0)
Eosinophils Absolute: 0.1 10*3/uL (ref 0.0–0.7)
Eosinophils Relative: 0.6 % (ref 0.0–5.0)
HCT: 37.4 % (ref 36.0–46.0)
Hemoglobin: 12.3 g/dL (ref 12.0–15.0)
Lymphocytes Relative: 37.5 % (ref 12.0–46.0)
Lymphs Abs: 3.4 10*3/uL (ref 0.7–4.0)
MCHC: 32.8 g/dL (ref 30.0–36.0)
MCV: 86.8 fl (ref 78.0–100.0)
Monocytes Absolute: 0.4 10*3/uL (ref 0.1–1.0)
Monocytes Relative: 4.7 % (ref 3.0–12.0)
Neutro Abs: 5.1 10*3/uL (ref 1.4–7.7)
Neutrophils Relative %: 56 % (ref 43.0–77.0)
Platelets: 389 10*3/uL (ref 150.0–400.0)
RBC: 4.32 Mil/uL (ref 3.87–5.11)
RDW: 14.2 % (ref 11.5–15.5)
WBC: 9.1 10*3/uL (ref 4.0–10.5)

## 2021-04-21 MED ORDER — PANTOPRAZOLE SODIUM 40 MG PO TBEC: DELAYED_RELEASE_TABLET | ORAL | 1 refills | Status: DC

## 2021-04-21 NOTE — Progress Notes (Signed)
Subjective:    Patient ID: Anna Moses, female    DOB: 1975-06-12, 46 y.o.   MRN: [id number]  HPI Anna Moses is a 46 year old African-American female, established with Anna Moses and known to myself.  She comes back in today for follow-up after being seen in November 2022 with complaints of dysphagia.  At that time she had complained of symptoms over the prior 2 to 3 weeks, no odynophagia but felt like things were getting stuck and had primarily been consuming just liquids and Ensure. She also has history of constipation that when seen in November was not having any active issues with constipation. She had undergone EGD and colonoscopy in March 2021.  Colonoscopy showed 4 sessile polyps all 5 to 8 mm in size and 2 of those were adenomatous and one was a sessile serrated polyp, due for interval follow-up in March 2024 EGD at that time was normal but Anna Moses complaints of dysphagia she had empiric dilation to 16 mm and was noted to have some mild gastritis, biopsies were positive for H. pylori and she was treated. After her last office visit she was asked to continue on Protonix 40 mg p.o. twice daily. She underwent barium swallow on 03/11/2021 which showed normal esophageal motility, no evidence of stricturing ring or narrowing of the esophagus, barium tablet passed readily.  She says she has been doing pretty well over the past weeks, her dysphagia has resolved so she says she is avoiding most meats otherwise eating regular food without difficulty.  No complaints of heartburn or indigestion, no nausea or vomiting, no current abdominal pain and denies any issues with constipation at present.  Review of Systems. Pertinent positive and negative review of systems were noted in the above HPI section.  All other review of systems was otherwise negative.   Outpatient Encounter Medications as of 04/21/2021  Medication Sig   dicyclomine (BENTYL) 20 MG tablet Take 1 tablet (20 mg total) by mouth 2 (two)  times daily.   lidocaine (XYLOCAINE) 2 % solution Use as directed 10 mLs in the mouth or throat as needed for mouth pain.   loratadine (CLARITIN REDITABS) 10 MG dissolvable tablet Take 1 tablet (10 mg total) by mouth daily. As needed for allergy symptoms   medroxyPROGESTERone (DEPO-PROVERA) 150 MG/ML injection Inject 150 mg into the muscle every 3 (three) months.   [DISCONTINUED] pantoprazole (PROTONIX) 40 MG tablet TAKE 1 TABLET(40 MG) BY MOUTH TWICE DAILY BEFORE A MEAL   pantoprazole (PROTONIX) 40 MG tablet TAKE 1 TABLET(40 MG) BY MOUTH TWICE DAILY BEFORE A MEAL   [DISCONTINUED] amoxicillin-clavulanate (AUGMENTIN) 875-125 MG tablet Take 1 tablet by mouth every 12 (twelve) hours. (Patient not taking: Reported on 11/11/2020)   [DISCONTINUED] diphenhydrAMINE (BENADRYL) 25 MG tablet Take 1 tablet (25 mg total) by mouth every 6 (six) hours. (Patient not taking: Reported on 12/12/2018)   [DISCONTINUED] HYDROcodone-acetaminophen (NORCO) 5-325 MG tablet Take 1-2 tablets by mouth every 6 (six) hours as needed. (Patient not taking: Reported on 03/01/2021)   [DISCONTINUED] Ibuprofen-diphenhydrAMINE Cit (ADVIL PM) 200-38 MG TABS Take 2 tablets by mouth 2 (two) times daily as needed (headaches).   [DISCONTINUED] ondansetron (ZOFRAN ODT) 4 MG disintegrating tablet 4mg  ODT q4 hours prn nausea/vomit (Patient not taking: Reported on 03/01/2021)   [DISCONTINUED] risperiDONE (RISPERDAL) 1 MG tablet Take 1 mg by mouth at bedtime. (Patient not taking: Reported on 11/11/2020)   [DISCONTINUED] sertraline (ZOLOFT) 50 MG tablet Take 50 mg by mouth daily. (Patient not taking: Reported on 11/11/2020)  No facility-administered encounter medications on file as of 04/21/2021.   No Known Allergies Patient Active Problem List   Diagnosis Date Noted   Contraception management 11/17/2020   Depression, recurrent ([place]) 11/17/2020   Effusion of left knee 08/15/2020   Cervical cancer screening 09/18/2019   Routine screening for STI  (sexually transmitted infection) 16/10/3708   Cyclical vomiting 62/69/4854   Schizophrenia ([place]) 07/24/2019   H. pylori infection 07/24/2019   Seasonal allergies 07/24/2019   Dysphagia 06/03/2019   Nausea and vomiting 06/03/2019   Liver lesion 06/03/2019   Preop examination 06/03/2019   Social History   Socioeconomic History   Marital status: Single    Spouse name: Not on file   Number of children: 4   Years of education: Not on file   Highest education level: Not on file  Occupational History   Occupation: stay at home mother  Tobacco Use   Smoking status: Every Day    Types: Cigarettes   Smokeless tobacco: Never  Vaping Use   Vaping Use: Never used  Substance and Sexual Activity   Alcohol use: No   Drug use: Not Currently    Types: Marijuana   Sexual activity: Not on file  Other Topics Concern   Not on file  Social History Narrative   Not on file   Social Determinants of Health   Financial Resource Strain: Not on file  Food Insecurity: Not on file  Transportation Needs: Not on file  Physical Activity: Not on file  Stress: Not on file  Social Connections: Not on file  Intimate Partner Violence: Not on file    Anna Moses's family history includes Clotting disorder in her father; Crohn's disease in her mother; Liver disease in her brother and father.      Objective:    Vitals:   04/21/21 1013  BP: 124/72  Pulse: 76    Physical Exam Well-developed well-nourished  AA female in no acute distress.  Weight, BMI  HEENT; nontraumatic normocephalic, EOMI, PE R LA, sclera anicteric. Oropharynx; not examined today Neck; supple, no JVD Cardiovascular; regular rate and rhythm with S1-S2, no murmur rub or gallop Pulmonary; Clear bilaterally Abdomen; soft, nontender, nondistended, no palpable mass or hepatosplenomegaly, bowel sounds are active Rectal; not done today Skin; benign exam, no jaundice rash or appreciable lesions Extremities; no clubbing cyanosis or  edema skin warm and dry Neuro/Psych; alert and oriented x4, grossly nonfocal mood and affect appropriate        Assessment & Moses:   #71 46 year old African-American female with history of chronic GERD, negative EGD 2021, who presented in November 2022 with 2 to 3-week history of dysphagia to most solids, no odynophagia. She has been on twice daily PPI and barium swallow has been done which was completely normal, with normal esophageal motility, no evidence of stricturing or esophageal ring and barium tablet passed easily.  Symptoms have resolved at this time and she has no current complaints of dysphagia or odynophagia.  No heartburn or indigestion on Protonix.  #2 mild constipation-no current issues #3 history of adenomatous and sessile serrated polyps at colonoscopy March 2021-due for interval follow-up in 3 years March 2024 #4 schizophrenia #5.  History of depression #6 isolated heme positive stool with ER visit in October-no complaints of melena or hematochezia  Moses; continue Protonix 40 mg p.o. twice daily AC breakfast and AC dinner CBC today Moses follow-up colonoscopy March 2024 Patient will follow-up with Anna Moses or myself in 1 year or sooner if  needed.    Jeronda Don S Jatavius Ellenwood PA-C 04/21/2021   Cc: Carollee Leitz, MD

## 2021-04-21 NOTE — Patient Instructions (Signed)
If you are age 44 or younger, your body mass index should be between 19-25. Your Body mass index is 44.67 kg/m. If this is out of the aformentioned range listed, please consider follow up with your Primary Care Provider.   ________________________________________________________  The [facility] providers would like to encourage you to use [facility] to communicate with providers for non-urgent requests or questions.  Due to long hold times on the telephone, sending your provider a message by [facility] may be a faster and more efficient way to get a response.  Please allow 48 business hours for a response.  Please remember that this is for non-urgent requests.  _______________________________________________________  Refills of Pantoprazole have been sent to your pharmacy.  Follow up in 1 year or sooner.  Thank you for entrusting me with your care and choosing [facility].  Amy Esterwood, PA-C

## 2021-07-12 ENCOUNTER — Encounter: Payer: Self-pay | Admitting: Family Medicine

## 2021-07-12 ENCOUNTER — Ambulatory Visit (INDEPENDENT_AMBULATORY_CARE_PROVIDER_SITE_OTHER): Payer: Medicaid Other | Admitting: Family Medicine

## 2021-07-12 VITALS — BP 138/82 | HR 88 | Ht 61.0 in | Wt 233.0 lb

## 2021-07-12 DIAGNOSIS — Z3042 Encounter for surveillance of injectable contraceptive: Secondary | ICD-10-CM

## 2021-07-12 DIAGNOSIS — M5441 Lumbago with sciatica, right side: Secondary | ICD-10-CM | POA: Diagnosis present

## 2021-07-12 MED ORDER — MEDROXYPROGESTERONE ACETATE 150 MG/ML IM SUSP
150.0000 mg | Freq: Once | INTRAMUSCULAR | Status: AC
  Administered 2021-07-12: 150 mg via INTRAMUSCULAR

## 2021-07-12 MED ORDER — DICLOFENAC SODIUM 1 % EX GEL: 4.0000 g | Freq: Four times a day (QID) | CUTANEOUS | 1 refills | Status: DC

## 2021-07-12 MED ORDER — BACLOFEN 10 MG PO TABS
10.0000 mg | ORAL_TABLET | Freq: Three times a day (TID) | ORAL | 0 refills | Status: AC
Start: 2021-07-12 — End: 2021-07-26

## 2021-07-12 NOTE — Progress Notes (Signed)
?  Last Depo-Provera: 04/20/21. ? ?Depo-Provera 150 mg IM given by: Left deltoid per pt request. ?Next appointment due 6/19-7/3.  ?

## 2021-07-12 NOTE — Progress Notes (Signed)
? ? ?  SUBJECTIVE:  ? ?CHIEF COMPLAINT / HPI: Right back pain, Depo injection ? ?Patient reports having right-sided lower back pain for about a month.  Pain shoots down right leg.  Endorses intermittent numbness/tingling and mild weakness.  Denies any decrease in sensation, trauma, back injury, fever, incontinence of urine or bowel.  ? ?PERTINENT  PMH / PSH:  ?Depression ?Right Sciatica ? ?OBJECTIVE:  ? ?BP 138/82   Pulse 88   Ht '5\' 1"'$  (1.549 m)   Wt 233 lb (105.7 kg)   SpO2 100%   BMI 44.02 kg/m?   ? ?General: Alert, no acute distress ?Cardio: Normal S1 and S2, RRR, no r/m/g ?Pulm: CTAB, normal work of breathing ?MSK: no ecchymosis, edema or muscular atrophy noted at level of lumbar region. No lower vertebral point tenderness. Tenderness over Right SI joint.  Decreased Flexion/Extension/lateral bending secondary to pain.  NV intact, motor intact, gait normal. Reflexes 2+ ? ? ?  07/12/2021  ?  8:31 AM 02/19/2021  ?  9:35 AM 11/17/2020  ?  8:44 AM 08/14/2020  ? 10:33 AM 01/15/2020  ?  9:37 AM  ?Depression screen PHQ 2/9  ?Decreased Interest 0 0 '2 1 1  '$ ?Down, Depressed, Hopeless 0 0 '1 1 1  '$ ?PHQ - 2 Score 0 0 '3 2 2  '$ ?Altered sleeping '1 1 2 1 1  '$ ?Tired, decreased energy 1 0 '1 1 1  '$ ?Change in appetite 0 0 1 1 0  ?Feeling bad or failure about yourself  0 0 '1 1 1  '$ ?Trouble concentrating 0 0 '2 1 1  '$ ?Moving slowly or fidgety/restless 0 0 1 0 1  ?Suicidal thoughts 0 0 1 0 1  ?PHQ-9 Score '2 1 12 7 8  '$ ?  ? ?ASSESSMENT/PLAN:  ? ?Lower back pain ?Symptoms consistent with sciatica, no red flag.  Do not think imaging is warranted at this time given no trauma and has not tried conservative measures.  Patient cannot take NSAIDs secondary to reported h/o GI bleed. ?Diclofenac gel qid x 14 days ?Baclofen 10 mg TID as needed x 14 days ?Heat/Ice as needed ?Lifestyle changes to include weight loss and exercise ?Low back exercises provided ?Consider PT if symptoms do not improve ?Follow up in 1-2 weeks ? ?Contraception management ?UPT not  indicated as patient within window of contraception ?Depo Provera injection today administered by CMA ?  ? ? ?Carollee Leitz, MD ?[place]  ?

## 2021-07-12 NOTE — Patient Instructions (Addendum)
Thank you for coming to see me today. It was a pleasure. Today we talked about:  ? ?Daily recommendations of Calcium is 1200-1500 mg and Vitamin D 400-800 IU while taking Depo injections   ? ?Your next injection is due in 3 months ? ?Your next Pap smear is due June 2024 ? ?Voltaren gel 4 times a day ?Heat and Ice as needed daily ? ?If you would like some assistance to help quit smoking please schedule an appointment with me to discuss the many ways that we can assist you if and when you decide you are ready to take the next step toward a smoke free lifestyle.  ? ? ?Please follow-up with PCP in 1-2 weeks ? ?If you have any questions or concerns, please do not hesitate to call the office at [phone]. ? ?Best,  ? ?Carollee Leitz, MD   ? ?Sciatica ?Sciatica is pain, weakness, tingling, or loss of feeling (numbness) along the sciatic nerve. The sciatic nerve starts in the lower back and goes down the back of each leg. Sciatica usually goes away on its own or with treatment. Sometimes, sciatica may come back (recur). ?What are the causes? ?This condition happens when the sciatic nerve is pinched or has pressure put on it. This may be the result of: ?A disk in between the bones of the spine bulging out too far (herniated disk). ?Changes in the spinal disks that occur with aging. ?A condition that affects a muscle in the butt. ?Extra bone growth near the sciatic nerve. ?A break (fracture) of the area between your hip bones (pelvis). ?Pregnancy. ?Tumor. This is rare. ?What increases the risk? ?You are more likely to develop this condition if you: ?Play sports that put pressure or stress on the spine. ?Have poor strength and ease of movement (flexibility). ?Have had a back injury in the past. ?Have had back surgery. ?Sit for long periods of time. ?Do activities that involve bending or lifting over and over again. ?Are very overweight (obese). ?What are the signs or symptoms? ?Symptoms can vary from mild to very bad. They  may include: ?Any of these problems in the lower back, leg, hip, or butt: ?Mild tingling, loss of feeling, or dull aches. ?Burning sensations. ?Sharp pains. ?Loss of feeling in the back of the calf or the sole of the foot. ?Leg weakness. ?Very bad back pain that makes it hard to move. ?These symptoms may get worse when you cough, sneeze, or laugh. They may also get worse when you sit or stand for long periods of time. ?How is this treated? ?This condition often gets better without any treatment. However, treatment may include: ?Changing or cutting back on physical activity when you have pain. ?Doing exercises and stretching. ?Putting ice or heat on the affected area. ?Medicines that help: ?To relieve pain and swelling. ?To relax your muscles. ?Shots (injections) of medicines that help to relieve pain, irritation, and swelling. ?Surgery. ?Follow these instructions at home: ?Medicines ?Take over-the-counter and prescription medicines only as told by your doctor. ?Ask your doctor if the medicine prescribed to you: ?Requires you to avoid driving or using heavy machinery. ?Can cause trouble pooping (constipation). You may need to take these steps to prevent or treat trouble pooping: ?Drink enough fluids to keep your pee (urine) pale yellow. ?Take over-the-counter or prescription medicines. ?Eat foods that are high in fiber. These include beans, whole grains, and fresh fruits and vegetables. ?Limit foods that are high in fat and sugar. These include fried  or sweet foods. ?Managing pain ?  ?If told, put ice on the affected area. ?Put ice in a plastic bag. ?Place a towel between your skin and the bag. ?Leave the ice on for 20 minutes, 2-3 times a day. ?If told, put heat on the affected area. Use the heat source that your doctor tells you to use, such as a moist heat pack or a heating pad. ?Place a towel between your skin and the heat source. ?Leave the heat on for 20-30 minutes. ?Remove the heat if your skin turns bright  red. This is very important if you are unable to feel pain, heat, or cold. You may have a greater risk of getting burned. ?Activity ? ?Return to your normal activities as told by your doctor. Ask your doctor what activities are safe for you. ?Avoid activities that make your symptoms worse. ?Take short rests during the day. ?When you rest for a long time, do some physical activity or stretching between periods of rest. ?Avoid sitting for a long time without moving. Get up and move around at least one time each hour. ?Exercise and stretch regularly, as told by your doctor. ?Do not lift anything that is heavier than 10 lb (4.5 kg) while you have symptoms of sciatica. ?Avoid lifting heavy things even when you do not have symptoms. ?Avoid lifting heavy things over and over. ?When you lift objects, always lift in a way that is safe for your body. To do this, you should: ?Bend your knees. ?Keep the object close to your body. ?Avoid twisting. ?General instructions ?Stay at a healthy weight. ?Wear comfortable shoes that support your feet. Avoid wearing high heels. ?Avoid sleeping on a mattress that is too soft or too hard. You might have less pain if you sleep on a mattress that is firm enough to support your back. ?Keep all follow-up visits as told by your doctor. This is important. ?Contact a doctor if: ?You have pain that: ?Wakes you up when you are sleeping. ?Gets worse when you lie down. ?Is worse than the pain you have had in the past. ?Lasts longer than 4 weeks. ?You lose weight without trying. ?Get help right away if: ?You cannot control when you pee (urinate) or poop (have a bowel movement). ?You have weakness in any of these areas and it gets worse: ?Lower back. ?The area between your hip bones. ?Butt. ?Legs. ?You have redness or swelling of your back. ?You have a burning feeling when you pee. ?Summary ?Sciatica is pain, weakness, tingling, or loss of feeling (numbness) along the sciatic nerve. ?This condition  happens when the sciatic nerve is pinched or has pressure put on it. ?Sciatica can cause pain, tingling, or loss of feeling (numbness) in the lower back, legs, hips, and butt. ?Treatment often includes rest, exercise, medicines, and putting ice or heat on the affected area. ?This information is not intended to replace advice given to you by your health care provider. Make sure you discuss any questions you have with your health care provider. ?Document Revised: 04/16/2018 Document Reviewed: 04/16/2018 ?Elsevier Patient Education ? 2022 [place]. ? ? ?Back Exercises ?These exercises help to make your trunk and back strong. They also help to keep the lower back flexible. Doing these exercises can help to prevent or lessen pain in your lower back. ?If you have back pain, try to do these exercises 2-3 times each day or as told by your doctor. ?As you get better, do the exercises once each day. Repeat  the exercises more often as told by your doctor. ?To stop back pain from coming back, do the exercises once each day, or as told by your doctor. ?Do exercises exactly as told by your doctor. Stop right away if you feel sudden pain or your pain gets worse. ?Exercises ?Single knee to chest ?Do these steps 3-5 times in a row for each leg: ?Lie on your back on a firm bed or the floor with your legs stretched out. ?Bring one knee to your chest. ?Grab your knee or thigh with both hands and hold it in place. ?Pull on your knee until you feel a gentle stretch in your lower back or butt. ?Keep doing the stretch for 10-30 seconds. ?Slowly let go of your leg and straighten it. ?Pelvic tilt ?Do these steps 5-10 times in a row: ?Lie on your back on a firm bed or the floor with your legs stretched out. ?Bend your knees so they point up to the ceiling. Your feet should be flat on the floor. ?Tighten your lower belly (abdomen) muscles to press your lower back against the floor. This will make your tailbone point up to the ceiling  instead of pointing down to your feet or the floor. ?Stay in this position for 5-10 seconds while you gently tighten your muscles and breathe evenly. ?Cat-cow ?Do these steps until your lower back bends more easi

## 2021-07-14 ENCOUNTER — Encounter: Payer: Self-pay | Admitting: Family Medicine

## 2021-07-14 DIAGNOSIS — M545 Low back pain, unspecified: Secondary | ICD-10-CM | POA: Insufficient documentation

## 2021-07-14 NOTE — Assessment & Plan Note (Addendum)
Symptoms consistent with sciatica, no red flag.  Do not think imaging is warranted at this time given no trauma and has not tried conservative measures.  Patient cannot take NSAIDs secondary to reported h/o GI bleed. ?Diclofenac gel qid x 14 days ?Baclofen 10 mg TID as needed x 14 days ?Heat/Ice as needed ?Lifestyle changes to include weight loss and exercise ?Low back exercises provided ?Consider PT if symptoms do not improve ?Follow up in 1-2 weeks ?

## 2021-07-14 NOTE — Assessment & Plan Note (Signed)
UPT not indicated as patient within window of contraception ?Depo Provera injection today administered by CMA ?

## 2021-07-30 ENCOUNTER — Ambulatory Visit (HOSPITAL_COMMUNITY)
Admission: EM | Admit: 2021-07-30 | Discharge: 2021-07-30 | Disposition: A | Discharge status: Home / Self Care | Payer: Medicaid Other | Attending: Nurse Practitioner | Admitting: Nurse Practitioner

## 2021-07-30 ENCOUNTER — Encounter (HOSPITAL_COMMUNITY): Payer: Self-pay

## 2021-07-30 DIAGNOSIS — A084 Viral intestinal infection, unspecified: Secondary | ICD-10-CM

## 2021-07-30 MED ORDER — PREDNISONE 10 MG PO TABS: 10.0000 mg | ORAL_TABLET | Freq: Once | ORAL | Status: DC

## 2021-07-30 MED ORDER — ONDANSETRON 4 MG PO TBDP: 4.0000 mg | ORAL_TABLET | Freq: Three times a day (TID) | ORAL | 0 refills | Status: DC | PRN

## 2021-07-30 MED ORDER — SUCRALFATE 1 GM/10ML PO SUSP: 1.0000 g | Freq: Three times a day (TID) | ORAL | 0 refills | Status: DC

## 2021-07-30 MED ORDER — ONDANSETRON 4 MG PO TBDP
4.0000 mg | ORAL_TABLET | Freq: Once | ORAL | Status: AC
  Administered 2021-07-30: 4 mg via ORAL

## 2021-07-30 MED ORDER — ONDANSETRON 4 MG PO TBDP
ORAL_TABLET | ORAL | Status: AC
  Filled 2021-07-30: qty 1

## 2021-07-30 NOTE — ED Triage Notes (Signed)
Pt presents with c/o vomiting x 2 days. Pt states she has not been able to eat. States shes been drinking ensure and states she cannot hold it in.  ?

## 2021-07-30 NOTE — ED Provider Notes (Signed)
?Anna Moses ? ? ? ?CSN: [id number] ?Arrival date & time: 07/30/21  1544 ? ? ?  ? ?History   ?Chief Complaint ?Chief Complaint  ?Patient presents with  ? Vomiting  ? ? ?HPI ?Anna Moses is a 46 y.o. female.  ? ?Patient reports nausea and vomiting ongoing for the past 2 days. Also reports subjective fever with chills and sweats.  Denies chest pain, shortness of breath, dizziness, and blood in her stool or vomit.  She is worried she may have a bleeding ulcer. She had a "rabbit-pellet" like bowel movement yesterday.  She also has some epigastric pain that is worse with vomiting and decreased appetite.  Describes the vomit as yellow/bile colored.  She tried to drink ensure earlier today and was not able to keep it down.  ? ?She reports she is taking pantoprazole 40 mg twice daily as prescribed for GERD and she is up to date on colon cancer screening.  She also has a history of H. Pylori that was fully treated about 1 year ago.  She follows closely with GI. ? ? ? ?Past Medical History:  ?Diagnosis Date  ? Asthma   ? Bronchitis   ? Depression   ? Obesity   ? Schizophrenia ([place])   ? ? ?Patient Active Problem List  ? Diagnosis Date Noted  ? Lower back pain 07/14/2021  ? Contraception management 11/17/2020  ? Depression, recurrent ([place]) 11/17/2020  ? Effusion of left knee 08/15/2020  ? Cervical cancer screening 09/18/2019  ? Routine screening for STI (sexually transmitted infection) 09/18/2019  ? Cyclical vomiting 13/24/4010  ? Schizophrenia ([place]) 07/24/2019  ? H. pylori infection 07/24/2019  ? Seasonal allergies 07/24/2019  ? Dysphagia 06/03/2019  ? Nausea and vomiting 06/03/2019  ? Liver lesion 06/03/2019  ? Preop examination 06/03/2019  ? ? ?Past Surgical History:  ?Procedure Laterality Date  ? CESAREAN SECTION    ? COLONOSCOPY  06/27/2019  ? UPPER GASTROINTESTINAL ENDOSCOPY  06/27/2019  ? ? ?OB History   ?No obstetric history on file. ?  ? ? ? ?Home Medications   ? ?Prior to Admission medications    ?Medication Sig Start Date End Date Taking? Authorizing Provider  ?ondansetron (ZOFRAN-ODT) 4 MG disintegrating tablet Take 1 tablet (4 mg total) by mouth every 8 (eight) hours as needed for nausea or vomiting. 07/30/21  Yes Eulogio Bear, NP  ?sucralfate (CARAFATE) 1 GM/10ML suspension Take 10 mLs (1 g total) by mouth 4 (four) times daily -  with meals and at bedtime. 07/30/21  Yes Eulogio Bear, NP  ?diclofenac Sodium (VOLTAREN) 1 % GEL Apply 4 g topically 4 (four) times daily. 07/12/21   Carollee Leitz, MD  ?dicyclomine (BENTYL) 20 MG tablet Take 1 tablet (20 mg total) by mouth 2 (two) times daily. 11/11/20   Kommor, Madison, MD  ?lidocaine (XYLOCAINE) 2 % solution Use as directed 10 mLs in the mouth or throat as needed for mouth pain. 08/01/20   Volney American, PA-C  ?loratadine (CLARITIN REDITABS) 10 MG dissolvable tablet Take 1 tablet (10 mg total) by mouth daily. As needed for allergy symptoms 08/20/19   Benay Pike, MD  ?medroxyPROGESTERone (DEPO-PROVERA) 150 MG/ML injection Inject 150 mg into the muscle every 3 (three) months.    [provider]  ?pantoprazole (PROTONIX) 40 MG tablet TAKE 1 TABLET(40 MG) BY MOUTH TWICE DAILY BEFORE A MEAL 04/21/21   Esterwood, Amy S, PA-C  ?diphenhydrAMINE (BENADRYL) 25 MG tablet Take 1 tablet (25 mg total)  by mouth every 6 (six) hours. ?Patient not taking: Reported on 12/12/2018 10/21/17 05/30/19  Charlesetta Shanks, MD  ?Ibuprofen-diphenhydrAMINE Cit (ADVIL PM) 200-38 MG TABS Take 2 tablets by mouth 2 (two) times daily as needed (headaches).  05/30/19  [provider]  ? ? ?Family History ?Family History  ?Problem Relation Age of Onset  ? Crohn's disease Mother   ? Liver disease Father   ? Clotting disorder Father   ? Liver disease Brother   ? Colon cancer Neg Hx   ? Esophageal cancer Neg Hx   ? Rectal cancer Neg Hx   ? Stomach cancer Neg Hx   ? ? ?Social History ?Social History  ? ?Tobacco Use  ? Smoking status: Every Day  ?  Types: Cigarettes  ?  Smokeless tobacco: Never  ?Vaping Use  ? Vaping Use: Never used  ?Substance Use Topics  ? Alcohol use: No  ? Drug use: Not Currently  ?  Types: Marijuana  ? ? ? ?Allergies   ?Patient has no known allergies. ? ? ?Review of Systems ?Review of Systems ?Per HPI ? ?Physical Exam ?Triage Vital Signs ?ED Triage Vitals [07/30/21 1557]  ?Enc Vitals Group  ?   BP (!) 158/89  ?   Pulse Rate 66  ?   Resp 16  ?   Temp 99.4 ?F (37.4 ?C)  ?   Temp Source Oral  ?   SpO2 98 %  ?   Weight   ?   Height   ?   Head Circumference   ?   Peak Flow   ?   Pain Score 5  ?   Pain Loc   ?   Pain Edu?   ?   Excl. in [place]?   ? ?No data found. ? ?Updated Vital Signs ?BP (!) 158/89 (BP Location: Right Arm)   Pulse 66   Temp 99.4 ?F (37.4 ?C) (Oral)   Resp 16   SpO2 98%  ? ?Visual Acuity ?Right Eye Distance:   ?Left Eye Distance:   ?Bilateral Distance:   ? ?Right Eye Near:   ?Left Eye Near:    ?Bilateral Near:    ? ?Physical Exam ?Vitals and nursing note reviewed.  ?Constitutional:   ?   General: She is not in acute distress. ?   Appearance: Normal appearance. She is ill-appearing. She is not toxic-appearing.  ?HENT:  ?   Head: Normocephalic and atraumatic.  ?   Mouth/Throat:  ?   Mouth: Mucous membranes are moist.  ?   Pharynx: Oropharynx is clear. No oropharyngeal exudate or posterior oropharyngeal erythema.  ?Eyes:  ?   General: No scleral icterus. ?   Extraocular Movements: Extraocular movements intact.  ?Cardiovascular:  ?   Rate and Rhythm: Normal rate and regular rhythm.  ?Pulmonary:  ?   Effort: Pulmonary effort is normal. No respiratory distress.  ?   Breath sounds: Normal breath sounds. No wheezing, rhonchi or rales.  ?Abdominal:  ?   General: Abdomen is flat. Bowel sounds are decreased. There is no distension.  ?   Palpations: Abdomen is soft.  ?   Tenderness: There is abdominal tenderness in the epigastric area. There is no right CVA tenderness, left CVA tenderness, guarding or rebound.  ?Musculoskeletal:  ?   Cervical back: Normal  range of motion and neck supple.  ?Lymphadenopathy:  ?   Cervical: No cervical adenopathy.  ?Skin: ?   General: Skin is warm and dry.  ?   Capillary Refill:  Capillary refill takes less than 2 seconds.  ?   Coloration: Skin is not jaundiced or pale.  ?   Findings: No erythema or rash.  ?Neurological:  ?   Mental Status: She is alert and oriented to person, place, and time.  ?   Gait: Gait normal.  ?Psychiatric:     ?   Behavior: Behavior is cooperative.  ? ? ? ?UC Treatments / Results  ?Labs ?(all labs ordered are listed, but only abnormal results are displayed) ?Labs Reviewed - No data to display ? ?EKG ? ? ?Radiology ?No results found. ? ?Procedures ?Procedures (including critical care time) ? ?Medications Ordered in UC ?Medications  ?ondansetron (ZOFRAN-ODT) disintegrating tablet 4 mg (4 mg Oral Given 07/30/21 1607)  ? ? ?Initial Impression / Assessment and Plan / UC Course  ?I have reviewed the triage vital signs and the nursing notes. ? ?Pertinent labs & imaging results that were available during my care of the patient were reviewed by me and considered in my medical decision making (see chart for details). ? ?  ?Symptoms are consistent with viral gastroenteritis.  Zofran 4 mg ODT given today in urgent care and patient able to tolerate sips of ginger ale after.  Will discharge home with prescription for Zofran.  We will also give Carafate if symptoms worsen, although as I discussed with her, highly suspect viral gastroenteritis given body aches/chills, abrupt onset.  She is not vomiting blood at this time, I discussed with her if this begins or she develops blood in her stool, she is to go to emergency room immediately.  The patient was given the opportunity to ask questions.  All questions answered to their satisfaction.  The patient is in agreement to this plan.  ? ?Final Clinical Impressions(s) / UC Diagnoses  ? ?Final diagnoses:  ?Viral gastroenteritis  ? ? ? ?Discharge Instructions   ? ?  ?- We gave you a  dose of Zofran under your tongue today to help with nausea ?-Please take this every 8 hours as needed for nausea/vomiting and continue to push hydration at home ?-Your symptoms should last hopefully only a couple mo

## 2021-07-30 NOTE — Discharge Instructions (Signed)
-   We gave you a dose of Zofran under your tongue today to help with nausea ?-Please take this every 8 hours as needed for nausea/vomiting and continue to push hydration at home ?-Your symptoms should last hopefully only a couple more days and this should start to improve ?-If they do not improve, please start the Carafate liquid and follow-up with your gastroenterologist ?-If your symptoms worsen and you develop vomiting blood or blood in your stool, please go to the emergency room ? ?

## 2021-07-31 ENCOUNTER — Other Ambulatory Visit: Payer: Self-pay

## 2021-07-31 ENCOUNTER — Emergency Department (HOSPITAL_COMMUNITY)
Admission: EM | Admit: 2021-07-31 | Discharge: 2021-07-31 | Disposition: A | Discharge status: Home / Self Care | Payer: Medicaid Other | Attending: Emergency Medicine | Admitting: Emergency Medicine

## 2021-07-31 ENCOUNTER — Encounter (HOSPITAL_COMMUNITY): Payer: Self-pay

## 2021-07-31 ENCOUNTER — Encounter (HOSPITAL_COMMUNITY): Payer: Self-pay | Admitting: *Deleted

## 2021-07-31 ENCOUNTER — Emergency Department (HOSPITAL_COMMUNITY)
Admission: EM | Admit: 2021-07-31 | Discharge: 2021-07-31 | Disposition: A | Discharge status: Home / Self Care | Payer: Medicaid Other | Source: Home / Self Care | Attending: Emergency Medicine | Admitting: Emergency Medicine

## 2021-07-31 DIAGNOSIS — R197 Diarrhea, unspecified: Secondary | ICD-10-CM | POA: Insufficient documentation

## 2021-07-31 DIAGNOSIS — R112 Nausea with vomiting, unspecified: Secondary | ICD-10-CM

## 2021-07-31 DIAGNOSIS — R1010 Upper abdominal pain, unspecified: Secondary | ICD-10-CM | POA: Insufficient documentation

## 2021-07-31 DIAGNOSIS — Z5321 Procedure and treatment not carried out due to patient leaving prior to being seen by health care provider: Secondary | ICD-10-CM | POA: Diagnosis not present

## 2021-07-31 DIAGNOSIS — R109 Unspecified abdominal pain: Secondary | ICD-10-CM | POA: Insufficient documentation

## 2021-07-31 LAB — TYPE AND SCREEN
ABO/RH(D): A POS
Antibody Screen: NEGATIVE

## 2021-07-31 LAB — CBC WITH DIFFERENTIAL/PLATELET
Abs Immature Granulocytes: 0.04 10*3/uL (ref 0.00–0.07)
Basophils Absolute: 0 10*3/uL (ref 0.0–0.1)
Basophils Relative: 0 %
Eosinophils Absolute: 0 10*3/uL (ref 0.0–0.5)
Eosinophils Relative: 0 %
HCT: 42.9 % (ref 36.0–46.0)
Hemoglobin: 14.2 g/dL (ref 12.0–15.0)
Immature Granulocytes: 0 %
Lymphocytes Relative: 25 %
Lymphs Abs: 2.6 10*3/uL (ref 0.7–4.0)
MCH: 28.9 pg (ref 26.0–34.0)
MCHC: 33.1 g/dL (ref 30.0–36.0)
MCV: 87.2 fL (ref 80.0–100.0)
Monocytes Absolute: 0.5 10*3/uL (ref 0.1–1.0)
Monocytes Relative: 5 %
Neutro Abs: 7.1 10*3/uL (ref 1.7–7.7)
Neutrophils Relative %: 70 %
Platelets: 506 10*3/uL — ABNORMAL HIGH (ref 150–400)
RBC: 4.92 MIL/uL (ref 3.87–5.11)
RDW: 13.3 % (ref 11.5–15.5)
WBC: 10.2 10*3/uL (ref 4.0–10.5)
nRBC: 0 % (ref 0.0–0.2)

## 2021-07-31 LAB — PROTIME-INR
INR: 1 (ref 0.8–1.2)
Prothrombin Time: 13.4 seconds (ref 11.4–15.2)

## 2021-07-31 LAB — COMPREHENSIVE METABOLIC PANEL
ALT: 19 U/L (ref 0–44)
AST: 17 U/L (ref 15–41)
Albumin: 4.3 g/dL (ref 3.5–5.0)
Alkaline Phosphatase: 57 U/L (ref 38–126)
Anion gap: 12 (ref 5–15)
BUN: 13 mg/dL (ref 6–20)
CO2: 19 mmol/L — ABNORMAL LOW (ref 22–32)
Calcium: 10 mg/dL (ref 8.9–10.3)
Chloride: 108 mmol/L (ref 98–111)
Creatinine, Ser: 1.13 mg/dL — ABNORMAL HIGH (ref 0.44–1.00)
GFR, Estimated: >60 mL/min (ref >60)
Glucose, Bld: 140 mg/dL — ABNORMAL HIGH (ref 70–99)
Potassium: 4.1 mmol/L (ref 3.5–5.1)
Sodium: 139 mmol/L (ref 135–145)
Total Bilirubin: 0.9 mg/dL (ref 0.3–1.2)
Total Protein: 7.8 g/dL (ref 6.5–8.1)

## 2021-07-31 LAB — URINALYSIS, ROUTINE W REFLEX MICROSCOPIC
Bacteria, UA: NONE SEEN
Bilirubin Urine: NEGATIVE
Glucose, UA: NEGATIVE mg/dL
Hgb urine dipstick: NEGATIVE
Ketones, ur: 5 mg/dL — AB
Leukocytes,Ua: NEGATIVE
Nitrite: NEGATIVE
Protein, ur: 30 mg/dL — AB
Specific Gravity, Urine: 1.025 (ref 1.005–1.030)
pH: 6 (ref 5.0–8.0)

## 2021-07-31 LAB — APTT: aPTT: 27 seconds (ref 24–36)

## 2021-07-31 LAB — ABO/RH: ABO/RH(D): A POS

## 2021-07-31 LAB — LIPASE, BLOOD: Lipase: 47 U/L (ref 11–51)

## 2021-07-31 LAB — I-STAT BETA HCG BLOOD, ED (MC, WL, AP ONLY): I-stat hCG, quantitative: <5 m[IU]/mL (ref <5)

## 2021-07-31 MED ORDER — PROCHLORPERAZINE EDISYLATE 10 MG/2ML IJ SOLN
10.0000 mg | Freq: Once | INTRAMUSCULAR | Status: AC
Start: 2021-07-31 — End: 2021-07-31
  Administered 2021-07-31: 10 mg via INTRAVENOUS
  Filled 2021-07-31: qty 2

## 2021-07-31 MED ORDER — PROCHLORPERAZINE MALEATE 10 MG PO TABS: 10.0000 mg | ORAL_TABLET | Freq: Two times a day (BID) | ORAL | 0 refills | Status: DC | PRN

## 2021-07-31 MED ORDER — LACTATED RINGERS IV BOLUS
1000.0000 mL | Freq: Once | INTRAVENOUS | Status: AC
  Administered 2021-07-31: 1000 mL via INTRAVENOUS

## 2021-07-31 NOTE — ED Notes (Signed)
Patient left on own accord °

## 2021-07-31 NOTE — ED Triage Notes (Signed)
Pt presents with c/o vomiting and diarrhea for 3 days. Pt also reports associated abdominal pain. ?

## 2021-07-31 NOTE — ED Provider Notes (Signed)
?[facility] DEPT ?Provider Note ? ? ?CSN: [id number] ?Arrival date & time: 07/31/21  1330 ? ?  ? ?History ? ?Chief Complaint  ?Patient presents with  ? Vomiting  ? ? ?Anna Moses is a 46 y.o. female. ? ?HPI ?Patient presents with abdominal pain nausea vomiting.  Began around 3 days ago.  Had been seen at urgent care and given Zofran.  Continued nausea vomiting diarrhea.  Really has not been able to eat in 2 days.  Has not some chills.  Cramping in both back and somewhat in abdomen.  Question of slight amount of blood in the emesis.  States it could have been from some food that she was eating. ?  ? ?Home Medications ?Prior to Admission medications   ?Medication Sig Start Date End Date Taking? Authorizing Provider  ?prochlorperazine (COMPAZINE) 10 MG tablet Take 1 tablet (10 mg total) by mouth 2 (two) times daily as needed for nausea or vomiting. 07/31/21  Yes Davonna Belling, MD  ?diclofenac Sodium (VOLTAREN) 1 % GEL Apply 4 g topically 4 (four) times daily. 07/12/21   Carollee Leitz, MD  ?dicyclomine (BENTYL) 20 MG tablet Take 1 tablet (20 mg total) by mouth 2 (two) times daily. 11/11/20   Kommor, Madison, MD  ?lidocaine (XYLOCAINE) 2 % solution Use as directed 10 mLs in the mouth or throat as needed for mouth pain. 08/01/20   Volney American, PA-C  ?loratadine (CLARITIN REDITABS) 10 MG dissolvable tablet Take 1 tablet (10 mg total) by mouth daily. As needed for allergy symptoms 08/20/19   Benay Pike, MD  ?medroxyPROGESTERone (DEPO-PROVERA) 150 MG/ML injection Inject 150 mg into the muscle every 3 (three) months.    [provider]  ?ondansetron (ZOFRAN-ODT) 4 MG disintegrating tablet Take 1 tablet (4 mg total) by mouth every 8 (eight) hours as needed for nausea or vomiting. 07/30/21   Eulogio Bear, NP  ?pantoprazole (PROTONIX) 40 MG tablet TAKE 1 TABLET(40 MG) BY MOUTH TWICE DAILY BEFORE A MEAL 04/21/21   Esterwood, Amy S, PA-C  ?sucralfate (CARAFATE) 1  GM/10ML suspension Take 10 mLs (1 g total) by mouth 4 (four) times daily -  with meals and at bedtime. 07/30/21   Eulogio Bear, NP  ?diphenhydrAMINE (BENADRYL) 25 MG tablet Take 1 tablet (25 mg total) by mouth every 6 (six) hours. ?Patient not taking: Reported on 12/12/2018 10/21/17 05/30/19  Charlesetta Shanks, MD  ?Ibuprofen-diphenhydrAMINE Cit (ADVIL PM) 200-38 MG TABS Take 2 tablets by mouth 2 (two) times daily as needed (headaches).  05/30/19  [provider]  ?   ? ?Allergies    ?Patient has no known allergies.   ? ?Review of Systems   ?Review of Systems  ?Constitutional:  Positive for appetite change and chills.  ?Gastrointestinal:  Positive for abdominal pain, diarrhea, nausea and vomiting.  ?Musculoskeletal:  Negative for back pain.  ?Skin:  Negative for rash.  ?Neurological:  Negative for weakness.  ? ?Physical Exam ?Updated Vital Signs ?BP (!) 136/91   Pulse 60   Temp 99 ?F (37.2 ?C) (Oral)   Resp 18   SpO2 100%  ?Physical Exam ?Vitals and nursing note reviewed.  ?HENT:  ?   Head: Atraumatic.  ?Cardiovascular:  ?   Rate and Rhythm: Regular rhythm.  ?Abdominal:  ?   Tenderness: There is abdominal tenderness.  ?   Comments: Upper abdominal tenderness no rebound or guarding.  No hernia palpated  ?Musculoskeletal:     ?   General: No  tenderness.  ?   Cervical back: Neck supple.  ?Skin: ?   General: Skin is warm.  ?   Capillary Refill: Capillary refill takes less than 2 seconds.  ?Neurological:  ?   Mental Status: She is alert and oriented to person, place, and time.  ? ? ?ED Results / Procedures / Treatments   ?Labs ?(all labs ordered are listed, but only abnormal results are displayed) ?Labs Reviewed  ?URINALYSIS, ROUTINE W REFLEX MICROSCOPIC - Abnormal; Notable for the following components:  ?    Result Value  ? APPearance HAZY (*)   ? Ketones, ur 5 (*)   ? Protein, ur 30 (*)   ? All other components within normal limits  ? ? ?EKG ?None ? ?Radiology ?No results found. ? ?Procedures ?Procedures   ? ? ?Medications Ordered in ED ?Medications  ?lactated ringers bolus 1,000 mL (0 mLs Intravenous Stopped 07/31/21 1720)  ?prochlorperazine (COMPAZINE) injection 10 mg (10 mg Intravenous Given 07/31/21 1607)  ? ? ?ED Course/ Medical Decision Making/ A&P ?  ?                        ?Medical Decision Making ?Amount and/or Complexity of Data Reviewed ?Labs: ordered. ? ?Risk ?Prescription drug management. ? ? ?Patient presents with nausea vomiting diarrhea.  Has had for the last couple days.  Had been seen in urgent care and then at [facility] although was not actually seen at [facility].  Lab work done and reviewed at that time.  Overall reassuring.  Urine done today and did not show too much dehydration.  Feels better and is tolerated oral.  Likely gastroenteritis or equivalent.  Doubt obstruction or severe intra-abdominal pathology.  Will discharge home with symptomatic treatment.  Feels better after fluids.  Discharge home.  I reviewed previous urgent care note ? ? ? ? ? ? ? ?Final Clinical Impression(s) / ED Diagnoses ?Final diagnoses:  ?Nausea vomiting and diarrhea  ? ? ?Rx / DC Orders ?ED Discharge Orders   ? ?      Ordered  ?  prochlorperazine (COMPAZINE) 10 MG tablet  2 times daily PRN       ? 07/31/21 1822  ? ?  ?  ? ?  ? ? ?  ?Davonna Belling, MD ?07/31/21 2322 ? ?

## 2021-07-31 NOTE — ED Notes (Signed)
PO intake tolerated well. ?

## 2021-07-31 NOTE — ED Provider Triage Note (Signed)
Emergency Medicine Provider Triage Evaluation Note ? ?Anna Moses , a 46 y.o. female  was evaluated in triage.  Pt complains of vomiting onset 3 days.  Patient has a history of GERD.  Patient was seen at [facility] earlier today however left due to the wait time. Has associated abdominal pain, vomiting, diarrhea.  Denies fever, dysuria, hematuria, vaginal bleeding, vaginal discharge. ? ? ?Review of Systems  ?Positive: As per HPI above ?Negative:  ? ?Physical Exam  ?BP (!) 154/93 (BP Location: Left Arm)   Pulse 77   Temp 98.4 ?F (36.9 ?C) (Oral)   Resp 17   SpO2 100%  ?Gen:   Awake, no distress   ?Resp:  Normal effort  ?MSK:   Moves extremities without difficulty  ?Other:   ? ?Medical Decision Making  ?Medically screening exam initiated at 1:47 PM.  Appropriate orders placed.  Anna Moses was informed that the remainder of the evaluation will be completed by another provider, this initial triage assessment does not replace that evaluation, and the importance of remaining in the ED until their evaluation is complete. ?  ?Laurabelle Gorczyca A, PA-C ?07/31/21 1349 ? ?

## 2021-07-31 NOTE — ED Provider Triage Note (Signed)
Emergency Medicine Provider Triage Evaluation Note ? ?Tierany Appleby , a 46 y.o. female  was evaluated in triage.  Pt complains of abdominal pain, vomiting.  Hx of GERD and H. Pylori infection int he past.  She does follow with GI.  Seen yesterday at [facility] and given zofran without relief.  Continues vomiting in triage, thinks there may be some blood in it now.  Denies blood in stool. ? ?Review of Systems  ?Positive: Abdominal pain, vomiting ?Negative: fever ? ?Physical Exam  ?BP (!) 158/100   Pulse (!) 57   Temp 98.4 ?F (36.9 ?C) (Oral)   Resp 20   SpO2 95%  ?Gen:   Awake, vomiting in triage ?Resp:  Normal effort  ?MSK:   Moves extremities without difficulty  ?Other:  ? ?Medical Decision Making  ?Medically screening exam initiated at 1:58 AM.  Appropriate orders placed.  Jiana Vern Prestia was informed that the remainder of the evaluation will be completed by another provider, this initial triage assessment does not replace that evaluation, and the importance of remaining in the ED until their evaluation is complete. ? ?Abdominal pain, vomiting.  Hx of H. Pylori.  Continues vomiting in triage, concerned for blood present in emesis.  Will check labs, coags, type and screen. ?  ?Larene Pickett, PA-C ?07/31/21 0201 ? ?

## 2021-07-31 NOTE — ED Triage Notes (Signed)
Abd pain with nausea and vomiting for 3 days lmp none ?

## 2021-08-01 ENCOUNTER — Other Ambulatory Visit: Payer: Self-pay

## 2021-08-01 ENCOUNTER — Encounter (HOSPITAL_COMMUNITY): Payer: Self-pay | Admitting: Emergency Medicine

## 2021-08-01 ENCOUNTER — Emergency Department (HOSPITAL_COMMUNITY)
Admission: EM | Admit: 2021-08-01 | Discharge: 2021-08-01 | Disposition: A | Discharge status: Home / Self Care | Payer: Medicaid Other | Attending: Emergency Medicine | Admitting: Emergency Medicine

## 2021-08-01 ENCOUNTER — Emergency Department (HOSPITAL_COMMUNITY): Payer: Medicaid Other

## 2021-08-01 DIAGNOSIS — R197 Diarrhea, unspecified: Secondary | ICD-10-CM | POA: Diagnosis not present

## 2021-08-01 DIAGNOSIS — R112 Nausea with vomiting, unspecified: Secondary | ICD-10-CM | POA: Diagnosis present

## 2021-08-01 DIAGNOSIS — R1115 Cyclical vomiting syndrome unrelated to migraine: Secondary | ICD-10-CM | POA: Diagnosis not present

## 2021-08-01 DIAGNOSIS — R1013 Epigastric pain: Secondary | ICD-10-CM | POA: Insufficient documentation

## 2021-08-01 DIAGNOSIS — R1011 Right upper quadrant pain: Secondary | ICD-10-CM | POA: Insufficient documentation

## 2021-08-01 DIAGNOSIS — R1012 Left upper quadrant pain: Secondary | ICD-10-CM | POA: Insufficient documentation

## 2021-08-01 LAB — CBC WITH DIFFERENTIAL/PLATELET
Abs Immature Granulocytes: 0.03 10*3/uL (ref 0.00–0.07)
Basophils Absolute: 0.1 10*3/uL (ref 0.0–0.1)
Basophils Relative: 1 %
Eosinophils Absolute: 0 10*3/uL (ref 0.0–0.5)
Eosinophils Relative: 0 %
HCT: 40.7 % (ref 36.0–46.0)
Hemoglobin: 13.2 g/dL (ref 12.0–15.0)
Immature Granulocytes: 0 %
Lymphocytes Relative: 30 %
Lymphs Abs: 2.8 10*3/uL (ref 0.7–4.0)
MCH: 28.6 pg (ref 26.0–34.0)
MCHC: 32.4 g/dL (ref 30.0–36.0)
MCV: 88.3 fL (ref 80.0–100.0)
Monocytes Absolute: 0.6 10*3/uL (ref 0.1–1.0)
Monocytes Relative: 7 %
Neutro Abs: 5.7 10*3/uL (ref 1.7–7.7)
Neutrophils Relative %: 62 %
Platelets: 459 10*3/uL — ABNORMAL HIGH (ref 150–400)
RBC: 4.61 MIL/uL (ref 3.87–5.11)
RDW: 13.3 % (ref 11.5–15.5)
WBC: 9.1 10*3/uL (ref 4.0–10.5)
nRBC: 0 % (ref 0.0–0.2)

## 2021-08-01 LAB — I-STAT BETA HCG BLOOD, ED (MC, WL, AP ONLY): I-stat hCG, quantitative: <5 m[IU]/mL (ref <5)

## 2021-08-01 LAB — COMPREHENSIVE METABOLIC PANEL
ALT: 19 U/L (ref 0–44)
AST: 20 U/L (ref 15–41)
Albumin: 3.9 g/dL (ref 3.5–5.0)
Alkaline Phosphatase: 49 U/L (ref 38–126)
Anion gap: 10 (ref 5–15)
BUN: 10 mg/dL (ref 6–20)
CO2: 25 mmol/L (ref 22–32)
Calcium: 9.5 mg/dL (ref 8.9–10.3)
Chloride: 104 mmol/L (ref 98–111)
Creatinine, Ser: 1.02 mg/dL — ABNORMAL HIGH (ref 0.44–1.00)
GFR, Estimated: >60 mL/min (ref >60)
Glucose, Bld: 103 mg/dL — ABNORMAL HIGH (ref 70–99)
Potassium: 3.6 mmol/L (ref 3.5–5.1)
Sodium: 139 mmol/L (ref 135–145)
Total Bilirubin: 1.1 mg/dL (ref 0.3–1.2)
Total Protein: 7.2 g/dL (ref 6.5–8.1)

## 2021-08-01 LAB — LIPASE, BLOOD: Lipase: 30 U/L (ref 11–51)

## 2021-08-01 MED ORDER — SODIUM CHLORIDE 0.9 % IV BOLUS
1000.0000 mL | Freq: Once | INTRAVENOUS | Status: AC
  Administered 2021-08-01: 1000 mL via INTRAVENOUS

## 2021-08-01 MED ORDER — METOCLOPRAMIDE HCL 5 MG/ML IJ SOLN
10.0000 mg | Freq: Once | INTRAMUSCULAR | Status: AC
  Administered 2021-08-01: 10 mg via INTRAVENOUS
  Filled 2021-08-01: qty 2

## 2021-08-01 MED ORDER — METOCLOPRAMIDE HCL 10 MG PO TABS: 10.0000 mg | ORAL_TABLET | Freq: Three times a day (TID) | ORAL | 0 refills | Status: DC | PRN

## 2021-08-01 MED ORDER — HYDROMORPHONE HCL 1 MG/ML IJ SOLN
1.0000 mg | Freq: Once | INTRAMUSCULAR | Status: AC
  Administered 2021-08-01: 1 mg via INTRAVENOUS
  Filled 2021-08-01: qty 1

## 2021-08-01 MED ORDER — PROMETHAZINE HCL 25 MG RE SUPP: 25.0000 mg | Freq: Four times a day (QID) | RECTAL | 0 refills | Status: DC | PRN

## 2021-08-01 NOTE — Discharge Instructions (Addendum)
If you develop worsening, continued, or recurrent abdominal pain, uncontrolled vomiting, fever, chest or back pain, or any other new/concerning symptoms then return to the ER for evaluation.  

## 2021-08-01 NOTE — ED Triage Notes (Signed)
PT to ER via EMS from home with c/o abdominal pain and vomiting for last several days.  Pt has been seen at [facility]gent [facility] and [facility].  Pt was given zofran and "a liquid medication".  She continues to have vomiting despite zofran so has not been able to take the other medication.  Pt also reports "black" stools for the last 3 days.   ?

## 2021-08-01 NOTE — ED Provider Notes (Signed)
?[facility] ?Provider Note ? ? ?CSN: [id number] ?Arrival date & time: 08/01/21  1435 ? ?  ? ?History ? ?Chief Complaint  ?Patient presents with  ? Abdominal Pain  ? Emesis  ? ? ?Anna Moses is a 46 y.o. female. ? ?HPI ?46 year old female presents with vomiting and abdominal pain.  Started on 4/20.  She states she has had multiple episodes of emesis and yesterday there was some specks of blood in it.  Since last night she has had black stools and is continuing to have diarrhea.  Has a lot of abdominal pain that is sharp, primarily epigastric.  Has on and off back spasms.  No urinary symptoms or fevers.  Occasionally she gets chest pain when vomiting but no other chest pain.  The ODT Zofran she was prescribed is not helping. ? ?Patient has a prior history of H. pylori.  She also carries a diagnosis of schizophrenia and states that she will medicates with smoking marijuana 1 or 2 times a day though during this illness she has not smoked or smoked cigarettes.  Denies alcohol use. ? ?Home Medications ?Prior to Admission medications   ?Medication Sig Start Date End Date Taking? Authorizing Provider  ?metoCLOPramide (REGLAN) 10 MG tablet Take 1 tablet (10 mg total) by mouth every 8 (eight) hours as needed for nausea. 08/01/21  Yes Sherwood Gambler, MD  ?promethazine (PHENERGAN) 25 MG suppository Place 1 suppository (25 mg total) rectally every 6 (six) hours as needed for nausea or vomiting. 08/01/21  Yes Sherwood Gambler, MD  ?diclofenac Sodium (VOLTAREN) 1 % GEL Apply 4 g topically 4 (four) times daily. 07/12/21   Carollee Leitz, MD  ?dicyclomine (BENTYL) 20 MG tablet Take 1 tablet (20 mg total) by mouth 2 (two) times daily. 11/11/20   Kommor, [place], MD  ?lidocaine (XYLOCAINE) 2 % solution Use as directed 10 mLs in the mouth or throat as needed for mouth pain. 08/01/20   Volney American, PA-C  ?loratadine (CLARITIN REDITABS) 10 MG dissolvable tablet Take 1 tablet (10 mg total)  by mouth daily. As needed for allergy symptoms 08/20/19   Benay Pike, MD  ?medroxyPROGESTERone (DEPO-PROVERA) 150 MG/ML injection Inject 150 mg into the muscle every 3 (three) months.    [provider]  ?ondansetron (ZOFRAN-ODT) 4 MG disintegrating tablet Take 1 tablet (4 mg total) by mouth every 8 (eight) hours as needed for nausea or vomiting. 07/30/21   Eulogio Bear, NP  ?pantoprazole (PROTONIX) 40 MG tablet TAKE 1 TABLET(40 MG) BY MOUTH TWICE DAILY BEFORE A MEAL 04/21/21   Esterwood, Amy S, PA-C  ?prochlorperazine (COMPAZINE) 10 MG tablet Take 1 tablet (10 mg total) by mouth 2 (two) times daily as needed for nausea or vomiting. 07/31/21   Davonna Belling, MD  ?sucralfate (CARAFATE) 1 GM/10ML suspension Take 10 mLs (1 g total) by mouth 4 (four) times daily -  with meals and at bedtime. 07/30/21   Eulogio Bear, NP  ?diphenhydrAMINE (BENADRYL) 25 MG tablet Take 1 tablet (25 mg total) by mouth every 6 (six) hours. ?Patient not taking: Reported on 12/12/2018 10/21/17 05/30/19  Charlesetta Shanks, MD  ?Ibuprofen-diphenhydrAMINE Cit (ADVIL PM) 200-38 MG TABS Take 2 tablets by mouth 2 (two) times daily as needed (headaches).  05/30/19  [provider]  ?   ? ?Allergies    ?Patient has no known allergies.   ? ?Review of Systems   ?Review of Systems  ?Constitutional:  Negative for fever.  ?Cardiovascular:  Positive for chest pain.  ?Gastrointestinal:  Positive for abdominal pain, diarrhea, nausea and vomiting.  ?Genitourinary:  Negative for dysuria.  ?Musculoskeletal:  Positive for back pain.  ? ?Physical Exam ?Updated Vital Signs ?BP 126/72   Pulse 72   Temp 98.1 ?F (36.7 ?C)   Resp 18   Ht '5\' 1"'$  (1.549 m)   Wt 105.6 kg   SpO2 100%   BMI 43.99 kg/m?  ?Physical Exam ?Vitals and nursing note reviewed.  ?Constitutional:   ?   Appearance: She is well-developed. She is obese.  ?   Comments: She is not sitting still due to pain. Grinding her teeth frequently. Not currently vomiting  ?HENT:  ?    Head: Normocephalic and atraumatic.  ?Cardiovascular:  ?   Rate and Rhythm: Normal rate and regular rhythm.  ?   Heart sounds: Normal heart sounds.  ?Pulmonary:  ?   Effort: Pulmonary effort is normal.  ?   Breath sounds: Normal breath sounds.  ?Abdominal:  ?   Palpations: Abdomen is soft.  ?   Tenderness: There is abdominal tenderness in the right upper quadrant, epigastric area and left upper quadrant.  ?Skin: ?   General: Skin is warm and dry.  ?Neurological:  ?   Mental Status: She is alert.  ? ? ?ED Results / Procedures / Treatments   ?Labs ?(all labs ordered are listed, but only abnormal results are displayed) ?Labs Reviewed  ?CBC WITH DIFFERENTIAL/PLATELET - Abnormal; Notable for the following components:  ?    Result Value  ? Platelets 459 (*)   ? All other components within normal limits  ?COMPREHENSIVE METABOLIC PANEL - Abnormal; Notable for the following components:  ? Glucose, Bld 103 (*)   ? Creatinine, Ser 1.02 (*)   ? All other components within normal limits  ?LIPASE, BLOOD  ?I-STAT BETA HCG BLOOD, ED ([facility], [facility], [facility] ONLY)  ? ? ?EKG ?EKG Interpretation ? ?Date/Time:  Sunday August 01 2021 16:36:51 EDT ?Ventricular Rate:  51 ?PR Interval:  129 ?QRS Duration: 103 ?QT Interval:  485 ?QTC Calculation: 447 ?R Axis:   71 ?Text Interpretation: Sinus rhythm no acute ST/T changes similar ot 2021 Confirmed by Sherwood Gambler [phone]) on 08/01/2021 4:47:49 PM ? ?Radiology ?DG Chest Portable 1 View ? ?Result Date: 08/01/2021 ?CLINICAL DATA:  Vomiting.  Abdominal pain and emesis. EXAM: PORTABLE CHEST 1 VIEW COMPARISON:  [facility] chest 06/03/2019 FINDINGS: Cardiac silhouette and mediastinal contours are within normal limits. The lungs are clear. No pleural effusion or pneumothorax. No acute skeletal abnormality. IMPRESSION: No active disease. Electronically Signed   By: Yvonne Kendall M.D.   On: 08/01/2021 15:47   ? ?Procedures ?Procedures  ? ? ?Medications Ordered in ED ?Medications  ?HYDROmorphone (DILAUDID) injection 1 mg (1  mg Intravenous Given 08/01/21 1543)  ?sodium chloride 0.9 % bolus 1,000 mL (1,000 mLs Intravenous New Bag/Given 08/01/21 1542)  ?metoCLOPramide (REGLAN) injection 10 mg (10 mg Intravenous Given 08/01/21 1543)  ? ? ?ED Course/ Medical Decision Making/ A&P ?  ?                        ?Medical Decision Making ?Amount and/or Complexity of Data Reviewed ?External Data Reviewed: labs, radiology and notes. ?Labs: ordered. ?Radiology: ordered and independent interpretation performed. ?ECG/medicine tests: ordered and independent interpretation performed. ? ?Risk ?Prescription drug management. ? ? ?Chart reviewed, patient has many similar nausea/vomiting and/or abdominal pain presentations to the emergency department.  My suspicion is she has  some form of cyclic vomiting, probably marijuana induced.  Here she is much better after IV Dilaudid and Reglan and fluids.  Labs interpreted by myself and there is no acute anemia, electrolyte dysfunction or other emergent finding.  I do not think acute imaging is needed as she has had multiple CTs in the past without significant pathology.  I have a low suspicion for acute intra-abdominal emergency.  ECG reviewed/interpreted by myself and is unremarkable.  No ischemia.  Chest x-ray obtained given chest pain with vomiting but there is no obvious free air or lung abnormality.  Low suspicion for esophageal injury.  At this point she is tolerating p.o. and I think is stable for discharge home.  Given return precautions.  Will prescribe Phenergan suppository in addition to the Zofran she is on. ? ? ? ? ? ? ? ?Final Clinical Impression(s) / ED Diagnoses ?Final diagnoses:  ?Cyclic vomiting syndrome  ? ? ?Rx / DC Orders ?ED Discharge Orders   ? ?      Ordered  ?  metoCLOPramide (REGLAN) 10 MG tablet  Every 8 hours PRN       ? 08/01/21 1716  ?  promethazine (PHENERGAN) 25 MG suppository  Every 6 hours PRN       ? 08/01/21 1716  ? ?  ?  ? ?  ? ? ?  ?Sherwood Gambler, MD ?08/01/21 1728 ? ?

## 2021-08-02 ENCOUNTER — Telehealth: Payer: Self-pay | Admitting: Family Medicine

## 2021-08-02 NOTE — Telephone Encounter (Signed)
**  After Hours/ Emergency Line Call** ? ?Received a call to report that Anna Moses has been vomiting "the whole weekend". She was seen in the ED yesterday and was prescribed medications. States that she cannot keep anything down. She endorses some abdominal pains but feels that it is "hunger pains". She denies smoking any tobacco or marijuana recently.  She has not yet tried her nausea medication. I encouraged her to try the phenergan suppository and then try to drink fluids/food. She can take the reglan tablet as well if her nausea improves to the point she could keep it down. Discussed if recurrent/worsening nausea/vomiting or abdominal pain she should go back to the ED for possible fluid hydration and IV anti-emetics. Patient was amenable to plan. Will forward to PCP. ? ?Sharion Settler, DO ?PGY-2, [facility] ?08/02/2021 5:54 PM  ? ?

## 2021-08-10 ENCOUNTER — Ambulatory Visit (INDEPENDENT_AMBULATORY_CARE_PROVIDER_SITE_OTHER): Payer: Medicaid Other | Admitting: Family Medicine

## 2021-08-10 ENCOUNTER — Encounter: Payer: Self-pay | Admitting: Family Medicine

## 2021-08-10 ENCOUNTER — Other Ambulatory Visit (HOSPITAL_COMMUNITY): Payer: Self-pay

## 2021-08-10 VITALS — BP 126/80 | HR 96 | Temp 98.7°F | Ht 61.0 in | Wt 225.2 lb

## 2021-08-10 DIAGNOSIS — T148XXA Other injury of unspecified body region, initial encounter: Secondary | ICD-10-CM | POA: Diagnosis not present

## 2021-08-10 DIAGNOSIS — R112 Nausea with vomiting, unspecified: Secondary | ICD-10-CM

## 2021-08-10 DIAGNOSIS — M545 Low back pain, unspecified: Secondary | ICD-10-CM | POA: Diagnosis not present

## 2021-08-10 DIAGNOSIS — R1011 Right upper quadrant pain: Secondary | ICD-10-CM

## 2021-08-10 LAB — POCT URINALYSIS DIP (CLINITEK)
Bilirubin, UA: NEGATIVE
Glucose, UA: NEGATIVE mg/dL
Ketones, POC UA: NEGATIVE mg/dL
Leukocytes, UA: NEGATIVE
Nitrite, UA: NEGATIVE
POC PROTEIN,UA: NEGATIVE
Spec Grav, UA: >=1.03 — AB (ref 1.010–1.025)
Urobilinogen, UA: 0.2 E.U./dL
pH, UA: 5.5 (ref 5.0–8.0)

## 2021-08-10 MED ORDER — ACETAMINOPHEN ER 650 MG PO TBCR: 650.0000 mg | EXTENDED_RELEASE_TABLET | Freq: Three times a day (TID) | ORAL | 0 refills | Status: DC | PRN

## 2021-08-10 MED ORDER — LIDOCAINE 5 % EX PTCH: 1.0000 | MEDICATED_PATCH | CUTANEOUS | 0 refills | Status: DC

## 2021-08-10 NOTE — Patient Instructions (Addendum)
Thank you for coming to see me today. It was a pleasure.  ? ?I think likely you may have strained your back from excessive vomiting.  Use your diclofenac gel 4 times a day.  I have also sent a prescription for Lidoderm patch.  Apply every 24 hours. ?You can also use Tylenol 650 mg every 8 hours as needed. ? ?If your symptoms do not improve while using the above recommendations please notify MD. ? ?We will get some labs today.  If they are abnormal or we need to do something about them, I will call you.  If they are normal, I will send you a message on MyChart (if it is active) or a letter in the mail.  If you don't hear from [place] in 2 weeks, please call the office at the number below.  ? ?Continue Protonix 40 mg daily ? ?Recommend to discontinue use of Marijuana products as this increases nausea and vomiting.  ? ?Please follow-up with PCP 1 to 2 weeks or sooner as needed ? ?If you have any questions or concerns, please do not hesitate to call the office at [phone]. ? ?Best,  ? ?Carollee Leitz, MD  ? ?Back Exercises ?These exercises help to make your trunk and back strong. They also help to keep the lower back flexible. Doing these exercises can help to prevent or lessen pain in your lower back. ?If you have back pain, try to do these exercises 2-3 times each day or as told by your doctor. ?As you get better, do the exercises once each day. Repeat the exercises more often as told by your doctor. ?To stop back pain from coming back, do the exercises once each day, or as told by your doctor. ?Do exercises exactly as told by your doctor. Stop right away if you feel sudden pain or your pain gets worse. ?Exercises ?Single knee to chest ?Do these steps 3-5 times in a row for each leg: ?Lie on your back on a firm bed or the floor with your legs stretched out. ?Bring one knee to your chest. ?Grab your knee or thigh with both hands and hold it in place. ?Pull on your knee until you feel a gentle stretch in your lower back  or butt. ?Keep doing the stretch for 10-30 seconds. ?Slowly let go of your leg and straighten it. ?Pelvic tilt ?Do these steps 5-10 times in a row: ?Lie on your back on a firm bed or the floor with your legs stretched out. ?Bend your knees so they point up to the ceiling. Your feet should be flat on the floor. ?Tighten your lower belly (abdomen) muscles to press your lower back against the floor. This will make your tailbone point up to the ceiling instead of pointing down to your feet or the floor. ?Stay in this position for 5-10 seconds while you gently tighten your muscles and breathe evenly. ?Cat-cow ?Do these steps until your lower back bends more easily: ?Get on your hands and knees on a firm bed or the floor. Keep your hands under your shoulders, and keep your knees under your hips. You may put padding under your knees. ?Let your head hang down toward your chest. Tighten (contract) the muscles in your belly. Point your tailbone toward the floor so your lower back becomes rounded like the back of a cat. ?Stay in this position for 5 seconds. ?Slowly lift your head. Let the muscles of your belly relax. Point your tailbone up toward the ceiling  so your back forms a sagging arch like the back of a cow. ?Stay in this position for 5 seconds. ? ?Press-ups ?Do these steps 5-10 times in a row: ?Lie on your belly (face-down) on a firm bed or the floor. ?Place your hands near your head, about shoulder-width apart. ?While you keep your back relaxed and keep your hips on the floor, slowly straighten your arms to raise the top half of your body and lift your shoulders. Do not use your back muscles. You may change where you place your hands to make yourself more comfortable. ?Stay in this position for 5 seconds. Keep your back relaxed. ?Slowly return to lying flat on the floor. ? ?Bridges ?Do these steps 10 times in a row: ?Lie on your back on a firm bed or the floor. ?Bend your knees so they point up to the ceiling. Your  feet should be flat on the floor. Your arms should be flat at your sides, next to your body. ?Tighten your butt muscles and lift your butt off the floor until your waist is almost as high as your knees. If you do not feel the muscles working in your butt and the back of your thighs, slide your feet 1-2 inches (2.5-5 cm) farther away from your butt. ?Stay in this position for 3-5 seconds. ?Slowly lower your butt to the floor, and let your butt muscles relax. ?If this exercise is too easy, try doing it with your arms crossed over your chest. ?Belly crunches ?Do these steps 5-10 times in a row: ?Lie on your back on a firm bed or the floor with your legs stretched out. ?Bend your knees so they point up to the ceiling. Your feet should be flat on the floor. ?Cross your arms over your chest. ?Tip your chin a little bit toward your chest, but do not bend your neck. ?Tighten your belly muscles and slowly raise your chest just enough to lift your shoulder blades a tiny bit off the floor. Avoid raising your body higher than that because it can put too much stress on your lower back. ?Slowly lower your chest and your head to the floor. ?Back lifts ?Do these steps 5-10 times in a row: ?Lie on your belly (face-down) with your arms at your sides, and rest your forehead on the floor. ?Tighten the muscles in your legs and your butt. ?Slowly lift your chest off the floor while you keep your hips on the floor. Keep the back of your head in line with the curve in your back. Look at the floor while you do this. ?Stay in this position for 3-5 seconds. ?Slowly lower your chest and your face to the floor. ?Contact a doctor if: ?Your back pain gets a lot worse when you do an exercise. ?Your back pain does not get better within 2 hours after you exercise. ?If you have any of these problems, stop doing the exercises. Do not do them again unless your doctor says it is okay. ?Get help right away if: ?You have sudden, very bad back pain. If  this happens, stop doing the exercises. Do not do them again unless your doctor says it is okay. ?This information is not intended to replace advice given to you by your health care provider. Make sure you discuss any questions you have with your health care provider. ?Document Revised: 06/10/2020 Document Reviewed: 06/10/2020 ?Elsevier Patient Education ? [place]. ? ? ? ? ?

## 2021-08-10 NOTE — Progress Notes (Signed)
? ? ?  SUBJECTIVE:  ? ?CHIEF COMPLAINT / HPI: Follow-up sciatica pain. ? ?Patient reports right lower back pain ongoing for 4 months.  Aching pain that has been worsening.  Denies any injury, radiation of pain, weakness, numbness or tingling, urinary symptoms, fever, incontinence of bowel or bladder.  Reports nausea and vomiting for which she went to the ED on 4/23.  Labs were significant for mild elevation of creatinine.  She was given a liter IV normal saline, Dilaudid, metoclopramide for cyclic vomiting syndrome.  She continues to have medication that was prescribed on discharge and has been using this. ? ? ?PERTINENT  PMH / PSH:  ?Obesity class II ? ? ?OBJECTIVE:  ? ?BP 126/80   Pulse 96   Temp 98.7 ?F (37.1 ?C)   Ht '5\' 1"'$  (1.549 m)   Wt 225 lb 3.2 oz (102.2 kg)   SpO2 100%   BMI 42.55 kg/m?   ? ?General: Alert, no acute distress ?Cardio: Normal S1 and S2, RRR, no r/m/g ?Pulm: CTAB, normal work of breathing ?Abdomen: Bowel sounds normal. Abdomen soft and non-tender, no CVA tenderness ?Neuro/MSK: No point tenderness along vertebral column, mild paravertebral muscle tenderness with spasm.  SLR negative.  FADIR and Faber test negative.  Neurovascular is intact.  Motor and sensation intact.  Gait normal. ? ?ASSESSMENT/PLAN:  ? ?Muscle strain ?Suspect muscle strain from/cyclical vomiting.  Given continued nausea and decreased appetite  ?With approximate 10 pound weight loss since last visit will obtain CBC, CMET and lipase and urine today ?Acetaminophen 650 mg every 8 hours as needed ?Lidoderm patch every 24 hours ?Follow-up in 1 to 2 weeks ?  ? ? ?Carollee Leitz, MD ?[place]  ?

## 2021-08-11 LAB — CBC WITH DIFFERENTIAL/PLATELET
Basophils Absolute: 0.1 10*3/uL (ref 0.0–0.2)
Basos: 1 %
EOS (ABSOLUTE): 0.1 10*3/uL (ref 0.0–0.4)
Eos: 1 %
Hematocrit: 37.5 % (ref 34.0–46.6)
Hemoglobin: 12.3 g/dL (ref 11.1–15.9)
Immature Grans (Abs): 0 10*3/uL (ref 0.0–0.1)
Immature Granulocytes: 0 %
Lymphocytes Absolute: 3.2 10*3/uL — ABNORMAL HIGH (ref 0.7–3.1)
Lymphs: 39 %
MCH: 28.7 pg (ref 26.6–33.0)
MCHC: 32.8 g/dL (ref 31.5–35.7)
MCV: 88 fL (ref 79–97)
Monocytes Absolute: 0.5 10*3/uL (ref 0.1–0.9)
Monocytes: 7 %
Neutrophils Absolute: 4.2 10*3/uL (ref 1.4–7.0)
Neutrophils: 52 %
Platelets: 374 10*3/uL (ref 150–450)
RBC: 4.28 x10E6/uL (ref 3.77–5.28)
RDW: 12 % (ref 11.7–15.4)
WBC: 8.1 10*3/uL (ref 3.4–10.8)

## 2021-08-11 LAB — COMPREHENSIVE METABOLIC PANEL
ALT: 16 IU/L (ref 0–32)
AST: 12 IU/L (ref 0–40)
Albumin/Globulin Ratio: 1.7 (ref 1.2–2.2)
Albumin: 4 g/dL (ref 3.8–4.8)
Alkaline Phosphatase: 59 IU/L (ref 44–121)
BUN/Creatinine Ratio: 14 (ref 9–23)
BUN: 13 mg/dL (ref 6–24)
Bilirubin Total: <0.2 mg/dL (ref 0.0–1.2)
CO2: 20 mmol/L (ref 20–29)
Calcium: 9 mg/dL (ref 8.7–10.2)
Chloride: 104 mmol/L (ref 96–106)
Creatinine, Ser: 0.91 mg/dL (ref 0.57–1.00)
Globulin, Total: 2.3 g/dL (ref 1.5–4.5)
Glucose: 74 mg/dL (ref 70–99)
Potassium: 3.9 mmol/L (ref 3.5–5.2)
Sodium: 139 mmol/L (ref 134–144)
Total Protein: 6.3 g/dL (ref 6.0–8.5)
eGFR: 80 mL/min/{1.73_m2} (ref >59)

## 2021-08-11 LAB — LIPASE: Lipase: 37 U/L (ref 14–72)

## 2021-08-12 ENCOUNTER — Other Ambulatory Visit (HOSPITAL_COMMUNITY): Payer: Self-pay

## 2021-08-12 ENCOUNTER — Telehealth: Payer: Self-pay | Admitting: Family Medicine

## 2021-08-12 NOTE — Telephone Encounter (Signed)
Called patient to discuss lab results.  Will repeat u/a at next visit or within next 4 weeks. ? ?Carollee Leitz, MD ?[facility]   ?

## 2021-08-13 ENCOUNTER — Encounter: Payer: Self-pay | Admitting: Family Medicine

## 2021-08-13 ENCOUNTER — Other Ambulatory Visit (HOSPITAL_COMMUNITY): Payer: Self-pay

## 2021-08-13 DIAGNOSIS — T148XXA Other injury of unspecified body region, initial encounter: Secondary | ICD-10-CM | POA: Insufficient documentation

## 2021-08-13 NOTE — Assessment & Plan Note (Signed)
Suspect muscle strain from/cyclical vomiting.  Given continued nausea and decreased appetite  ?With approximate 10 pound weight loss since last visit will obtain CBC, CMET and lipase and urine today ?Acetaminophen 650 mg every 8 hours as needed ?Lidoderm patch every 24 hours ?Follow-up in 1 to 2 weeks ?

## 2021-08-17 ENCOUNTER — Other Ambulatory Visit (HOSPITAL_COMMUNITY): Payer: Self-pay

## 2021-08-17 ENCOUNTER — Telehealth: Payer: Self-pay

## 2021-08-17 NOTE — Telephone Encounter (Signed)
A Prior Authorization was initiated for this patients LIDOCAINE 5% PATCHES via [employer] phone call. ? ?PA# [id number] ? ?

## 2021-08-18 ENCOUNTER — Telehealth: Payer: Self-pay | Admitting: Family Medicine

## 2021-08-18 NOTE — Telephone Encounter (Signed)
Patient called to schedule for a lab visit for urine. She stated doctor called her wanting her to come back in and have it retested. I didn't see any orders placed. She is scheduled for tomorrow (08/19/2021) at 1:45pm.  ? ?Please advise.  ? ?Thanks!  ?

## 2021-08-18 NOTE — Telephone Encounter (Signed)
Spoke to Dr. Volanda Napoleon. Pt does not need to have urine checked for 4 weeks or next office visit. I called pt. Phone rang with no answer. Will try to call pt in the am to let her know not to come in for lab visit. Ottis Stain, CMA ? ?

## 2021-08-19 ENCOUNTER — Other Ambulatory Visit: Payer: Medicaid Other

## 2021-08-19 NOTE — Telephone Encounter (Signed)
Prior Auth for patients medication LIDOCAINE PATCHES denied by MEDICAID via CoverMyMeds.  ? ?CAN BE PURCHASED OTC ? ?

## 2021-08-20 NOTE — Telephone Encounter (Signed)
Pt has an appt scheduled for 5/172023 with Dr. Volanda Napoleon. Will get urine tested at that time. Ottis Stain, CMA ? ?

## 2021-08-25 ENCOUNTER — Ambulatory Visit (INDEPENDENT_AMBULATORY_CARE_PROVIDER_SITE_OTHER): Payer: Medicaid Other | Admitting: Family Medicine

## 2021-08-25 ENCOUNTER — Other Ambulatory Visit: Payer: Self-pay | Admitting: Family Medicine

## 2021-08-25 ENCOUNTER — Encounter: Payer: Self-pay | Admitting: Family Medicine

## 2021-08-25 VITALS — BP 134/81 | HR 95 | Ht 61.0 in | Wt 225.6 lb

## 2021-08-25 DIAGNOSIS — N644 Mastodynia: Secondary | ICD-10-CM

## 2021-08-25 MED ORDER — IBUPROFEN 600 MG PO TABS
600.0000 mg | ORAL_TABLET | Freq: Three times a day (TID) | ORAL | 0 refills | Status: DC | PRN
Start: 2021-08-25 — End: 2021-09-07

## 2021-08-25 NOTE — Patient Instructions (Addendum)
Thank you for coming to see me today. It was a pleasure.  ? ?Take ibuprofen 600 mg every 6 hours for pain as needed ?Heat ice to affected area as needed ? ?We will call you with results of your mammogram. ? ?Please follow-up with PCP May 30 at 230p ? ?If you have any questions or concerns, please do not hesitate to call the office at [phone]. ? ?Best,  ? ?Carollee Leitz, MD   ? ?Costochondritis ? ?Costochondritis is irritation and swelling (inflammation) of the tissue that connects the ribs to the breastbone (sternum). This tissue is called cartilage. ?Costochondritis causes pain in the front of the chest. Usually, the pain: ?Starts slowly. ?Is in more than one rib. ?What are the causes? ?The exact cause of this condition is not always known. It results from stress on the tissue in the affected area. The cause of this stress could be: ?Chest injury. ?Exercise or activity, such as lifting. ?Very bad coughing. ?What increases the risk? ?You are more likely to develop this condition if you: ?Are female. ?Are 9-61 years old. ?Recently started a new exercise or work activity. ?Have low levels of vitamin D. ?Have a condition that makes you cough often. ?What are the signs or symptoms? ?The main symptom of this condition is chest pain. The pain: ?Usually starts slowly and can be sharp or dull. ?Gets worse with deep breathing, coughing, or exercise. ?Gets better with rest. ?May be worse when you press on the affected area of your ribs and breastbone. ?How is this treated? ?This condition usually goes away on its own over time. Your doctor may prescribe an NSAID, such as ibuprofen. This can help reduce pain and inflammation. Treatment may also include: ?Resting and avoiding activities that make pain worse. ?Putting heat or ice on the painful area. ?Doing exercises to stretch your chest muscles. ?If these treatments do not help, your doctor may inject a numbing medicine to help relieve the pain. ?Follow these  instructions at home: ?Managing pain, stiffness, and swelling ? ?  ? ?If told, put ice on the painful area. To do this: ?Put ice in a plastic bag. ?Place a towel between your skin and the bag. ?Leave the ice on for 20 minutes, 2-3 times a day. ?If told, put heat on the affected area. Do this as often as told by your doctor. Use the heat source that your doctor recommends, such as a moist heat pack or a heating pad. ?Place a towel between your skin and the heat source. ?Leave the heat on for 20-30 minutes. ?Take off the heat if your skin turns bright red. This is very important if you cannot feel pain, heat, or cold. You may have a greater risk of getting burned. ?Activity ?Rest as told by your doctor. ?Do not do anything that makes your pain worse. This includes any activities that use chest, belly (abdomen), and side muscles. ?Do not lift anything that is heavier than 10 lb (4.5 kg), or the limit that you are told, until your doctor says that it is safe. ?Return to your normal activities as told by your doctor. Ask your doctor what activities are safe for you. ?General instructions ?Take over-the-counter and prescription medicines only as told by your doctor. ?Keep all follow-up visits as told by your doctor. This is important. ?Contact a doctor if: ?You have chills or a fever. ?Your pain does not go away or it gets worse. ?You have a cough that does not go away. ?  Get help right away if: ?You are short of breath. ?You have very bad chest pain that is not helped by medicines, heat, or ice. ?These symptoms may be an emergency. Do not wait to see if the symptoms will go away. Get medical help right away. Call your local emergency services (911 in the [place] Do not drive yourself to the hospital. ?Summary ?Costochondritis is irritation and swelling (inflammation) of the tissue that connects the ribs to the breastbone (sternum). ?This condition causes pain in the front of the chest. ?Treatment may include medicines,  rest, heat or ice, and exercises. ?This information is not intended to replace advice given to you by your health care provider. Make sure you discuss any questions you have with your health care provider. ?Document Revised: 02/08/2019 Document Reviewed: 02/08/2019 ?Elsevier Patient Education ? [place]. ? ?

## 2021-08-25 NOTE — Progress Notes (Addendum)
    SUBJECTIVE:   CHIEF COMPLAINT / HPI: left breast pain  Felt left breast pain when getting out of bed 2 days ago.  Reports pain is on the left side of her breast and extends towards nipple.  Endorses a tender nipple.  Denies any nipple discharge, breast swelling, redness, lumps or mass, skin changes, no trauma or recent injury.  Denies any fevers, is of breath, chest pain, nausea, vomiting, abdominal pain, diaphoreses.  Pain localized to left lateral side of breast does not radiate to jaw, neck or left arm or wrist.  Took Tylenol for pain which did not help.  Endorses pain feels like a pulling sensation.  Denies any history of breast cancer.   PMH / PSH:  None  OBJECTIVE:   BP 134/81   Pulse 95   Ht '5\' 1"'$  (1.549 m)   Wt 225 lb 9.6 oz (102.3 kg)   SpO2 100%   BMI 42.63 kg/m    General: Alert, no acute distress Cardiac: S1-S2 normal, RRR, no murmurs, gallops appreciated Breast exam: Chaperone present, April Left breast: No skin changes, erythema, ecchymosis or edema appreciated.  Tenderness along the left lateral aspect.  No nipple retraction or discharge.  No obvious masses, lumps on palpation. Right breast: No skin changes, erythema, ecchymosis or edema appreciated.  No tenderness, nipple discharge or retraction.  No obvious masses, lumps on palpation.   ASSESSMENT/PLAN:   Breast pain in female Left lateral breast pain with no suspicious masses, skin changes or nipple discharge.  No family history of breast cancer.  Suspect musculoskeletal etiology given tenderness on palpation. -Diagnostic mammogram with ultrasound -Ibuprofen 600 mg every 8 hours as needed -Follow-up with results     Carollee Leitz, MD [place]

## 2021-08-29 ENCOUNTER — Encounter: Payer: Self-pay | Admitting: Family Medicine

## 2021-08-29 DIAGNOSIS — N611 Abscess of the breast and nipple: Secondary | ICD-10-CM | POA: Insufficient documentation

## 2021-08-29 DIAGNOSIS — N644 Mastodynia: Secondary | ICD-10-CM | POA: Insufficient documentation

## 2021-08-29 NOTE — Assessment & Plan Note (Signed)
Left lateral breast pain with no suspicious masses, skin changes or nipple discharge.  No family history of breast cancer.  Suspect musculoskeletal etiology given tenderness on palpation. -Diagnostic mammogram with ultrasound -Ibuprofen 600 mg every 8 hours as needed -Follow-up with results

## 2021-09-07 ENCOUNTER — Encounter: Payer: Self-pay | Admitting: Family Medicine

## 2021-09-07 ENCOUNTER — Ambulatory Visit (INDEPENDENT_AMBULATORY_CARE_PROVIDER_SITE_OTHER): Payer: Medicaid Other | Admitting: Family Medicine

## 2021-09-07 VITALS — BP 128/72 | HR 96 | Ht 61.0 in | Wt 233.0 lb

## 2021-09-07 DIAGNOSIS — R319 Hematuria, unspecified: Secondary | ICD-10-CM | POA: Diagnosis not present

## 2021-09-07 DIAGNOSIS — N644 Mastodynia: Secondary | ICD-10-CM | POA: Diagnosis present

## 2021-09-07 DIAGNOSIS — R3129 Other microscopic hematuria: Secondary | ICD-10-CM

## 2021-09-07 LAB — POCT URINALYSIS DIP (MANUAL ENTRY)
Bilirubin, UA: NEGATIVE
Glucose, UA: NEGATIVE mg/dL
Ketones, POC UA: NEGATIVE mg/dL
Leukocytes, UA: NEGATIVE
Nitrite, UA: NEGATIVE
Protein Ur, POC: NEGATIVE mg/dL
Spec Grav, UA: 1.025 (ref 1.010–1.025)
Urobilinogen, UA: 0.2 E.U./dL
pH, UA: 5.5 (ref 5.0–8.0)

## 2021-09-07 NOTE — Progress Notes (Unsigned)
    SUBJECTIVE:   CHIEF COMPLAINT / HPI: follow up breat pain  Presents for follow up for left breast pain. Seen in clinic on 05/17 and treated with Ibuprofen.  A diagnostic mammogram was ordered and is scheduled for 06/02. Since then patient reports improvement in symptoms and pain has decreased. Denies any nipple discharge, breast swelling, skin changes or fevers.  Hematuria Patient concerned about last urine that had trace blood.  Denies any gross hematuria, weight loss, abdominal pain, back pain.  LMP unknown as she is on Depo.  Reports no history of tobacco use.   PERTINENT  PMH / PSH:  None OBJECTIVE:   BP 128/72   Pulse 96   Ht '5\' 1"'$  (1.549 m)   Wt 233 lb (105.7 kg)   SpO2 99%   BMI 44.02 kg/m    General: Alert, no acute distress Cardio: Normal S1 and S2, RRR, no r/m/g Pulm: CTAB, normal work of breathing Breast exam not performed: CMA not available to chaperone and patient needing leave.  ASSESSMENT/PLAN:   Breast pain in female Improving, likely MSK given improvement with antinflammatory medication Stop Ibuprofen for now given hematuria Will start Diclofenac gel QID as needed Follow up with imaging  Follow up with PCP 1-2 weeks after imaging  Hematuria Trace lysed blood on urine dipstick x 2.  Currently asymptomatic.   Will send urine for microscopy  Follow up with results    Carollee Leitz, MD [place]

## 2021-09-07 NOTE — Patient Instructions (Signed)
Thank you for coming to see me today. It was a pleasure.   Use Diclofenac gel 4 times a day for your breast pain Follow up with the mammogram as scheduled  Will send you urine for more evaluation.  Please follow-up with PCP in 2 weeks  If you have any questions or concerns, please do not hesitate to call the office at [phone].  Best,   Carollee Leitz, MD

## 2021-09-09 ENCOUNTER — Encounter: Payer: Self-pay | Admitting: Family Medicine

## 2021-09-09 DIAGNOSIS — R319 Hematuria, unspecified: Secondary | ICD-10-CM | POA: Insufficient documentation

## 2021-09-09 NOTE — Assessment & Plan Note (Addendum)
Improving, likely MSK given improvement with antinflammatory medication Stop Ibuprofen for now given hematuria Will start Diclofenac gel QID as needed Follow up with imaging  Follow up with PCP 1-2 weeks after imaging

## 2021-09-09 NOTE — Assessment & Plan Note (Signed)
Trace lysed blood on urine dipstick x 2.  Currently asymptomatic.   Will send urine for microscopy  Follow up with results

## 2021-09-10 ENCOUNTER — Other Ambulatory Visit: Payer: Self-pay | Admitting: Family Medicine

## 2021-09-10 ENCOUNTER — Ambulatory Visit
Admission: RE | Admit: 2021-09-10 | Discharge: 2021-09-10 | Disposition: A | Discharge status: Home / Self Care | Payer: Medicaid Other | Source: Ambulatory Visit | Attending: Family Medicine | Admitting: Family Medicine

## 2021-09-10 ENCOUNTER — Other Ambulatory Visit: Payer: Medicaid Other

## 2021-09-10 DIAGNOSIS — N644 Mastodynia: Secondary | ICD-10-CM

## 2021-09-10 DIAGNOSIS — N6320 Unspecified lump in the left breast, unspecified quadrant: Secondary | ICD-10-CM

## 2021-09-10 DIAGNOSIS — R319 Hematuria, unspecified: Secondary | ICD-10-CM

## 2021-09-11 LAB — URINALYSIS, MICROSCOPIC ONLY
Casts: NONE SEEN /lpf
WBC, UA: NONE SEEN /hpf (ref 0–5)

## 2021-09-13 ENCOUNTER — Ambulatory Visit
Admission: RE | Admit: 2021-09-13 | Discharge: 2021-09-13 | Disposition: A | Discharge status: Home / Self Care | Payer: Medicaid Other | Source: Ambulatory Visit | Attending: Family Medicine | Admitting: Family Medicine

## 2021-09-13 DIAGNOSIS — N6320 Unspecified lump in the left breast, unspecified quadrant: Secondary | ICD-10-CM

## 2021-09-13 HISTORY — PX: BREAST BIOPSY: SHX20

## 2021-09-27 ENCOUNTER — Ambulatory Visit: Payer: Medicaid Other | Admitting: Family Medicine

## 2021-09-27 NOTE — Progress Notes (Deleted)
    SUBJECTIVE:   CHIEF COMPLAINT / HPI:   ***  PERTINENT  PMH / PSH: ***  OBJECTIVE:   There were no vitals taken for this visit.   General: Alert, no acute distress Cardio: Normal S1 and S2, RRR, no r/m/g Pulm: CTAB, normal work of breathing Abdomen: Bowel sounds normal. Abdomen soft and non-tender.  Extremities: No peripheral edema.  Neuro: Cranial nerves grossly intact   ASSESSMENT/PLAN:   No problem-specific Assessment & Plan notes found for this encounter.     Chasity Outten, MD [facility]   

## 2021-09-30 ENCOUNTER — Ambulatory Visit (INDEPENDENT_AMBULATORY_CARE_PROVIDER_SITE_OTHER): Payer: Medicaid Other

## 2021-09-30 DIAGNOSIS — Z3042 Encounter for surveillance of injectable contraceptive: Secondary | ICD-10-CM

## 2021-09-30 MED ORDER — MEDROXYPROGESTERONE ACETATE 150 MG/ML IM SUSP
150.0000 mg | Freq: Once | INTRAMUSCULAR | Status: AC
  Administered 2021-09-30: 150 mg via INTRAMUSCULAR

## 2021-09-30 NOTE — Progress Notes (Signed)
Patient here today for Depo Provera injection and is within her dates.    Last contraceptive appt was 07/12/2021  Depo given in LD today per patient request. Site unremarkable & patient tolerated injection.    Next injection due 12/16/2021-12/30/2021. Reminder card given.

## 2021-10-05 ENCOUNTER — Encounter: Payer: Self-pay | Admitting: Family Medicine

## 2021-10-05 ENCOUNTER — Ambulatory Visit (INDEPENDENT_AMBULATORY_CARE_PROVIDER_SITE_OTHER): Payer: Medicaid Other | Admitting: Family Medicine

## 2021-10-05 DIAGNOSIS — T162XXA Foreign body in left ear, initial encounter: Secondary | ICD-10-CM

## 2021-10-05 DIAGNOSIS — N644 Mastodynia: Secondary | ICD-10-CM | POA: Diagnosis present

## 2021-10-05 NOTE — Progress Notes (Signed)
    SUBJECTIVE:   CHIEF COMPLAINT / HPI: Follow-up breast biopsy, funny sensation in ear  Patient returns to clinic for follow-up from recent breast biopsy.  She continues to have some pain in her left breast.  Denies any fevers, increased swelling, skin changes, drainage or nipple discharge.  Reports pain has somewhat subsided since I had initially seen her for her left breast pain.    Patient reports she feels something in her left ear moving.  Sensation has been ongoing for few days.  Denies any fevers, pain, decrease in hearing, ringing of the ear,    PERTINENT  PMH / PSH:  None  OBJECTIVE:   BP 118/70   Pulse 93   Ht '5\' 1"'$  (1.549 m)   Wt 230 lb (104.3 kg)   SpO2 98%   BMI 43.46 kg/m    General: Alert, no acute distress HEENT: Left TM visible, no bulging, erythema or ear discharge in canal.  Small insect, appears to be nonviable ant, noted at 7 o'clock.  Right TM normal, ear canal normal. Left breast exam: Chaperone present-Shelley No erythema, ecchymosis, skin changes or edema appreciated.  Nipple retracted without discharge.  Mild tenderness surrounding areolar. Cardio: Normal S1 and S2, RRR, no r/m/g Pulm: CTAB, normal work of breathing   ASSESSMENT/PLAN:   Breast pain in female Pathology report from left breast biopsy 06/5 ABUNDANT CHRONIC INFLAMMATION, FOREIGN BODY GIANT CELL REACTION TO SECRETED TYPE MATERIAL, FOAMY HISTIOCYTES AND AREAS OF ACUTE INFLAMMATION CONSISTENT WITH FOCAL ABSCESS FORMATION. I called breast center to clarify if any further drainage was needed was unable to speak with someone for clarification. Given patient has been afebrile, pain is reduced and no discharge from left breast will not start antibiotics today. Strict return precautions provided. Follow-up with PCP as needed.    Foreign body in left ear Small nonviable insect noted deep within left ear canal Unable to manually extract with curette.  Ear canal flushed.  Unable to view  foreign body after flushing. Discussed with patient that if any worsening pain, fevers or discharge please return to clinic for further evaluation Follow-up PCP as needed     Carollee Leitz, MD [place]

## 2021-10-08 ENCOUNTER — Encounter: Payer: Self-pay | Admitting: Family Medicine

## 2021-10-08 DIAGNOSIS — T162XXA Foreign body in left ear, initial encounter: Secondary | ICD-10-CM | POA: Insufficient documentation

## 2021-10-08 NOTE — Assessment & Plan Note (Signed)
Small nonviable insect noted deep within left ear canal Unable to manually extract with curette.  Ear canal flushed.  Unable to view foreign body after flushing. Discussed with patient that if any worsening pain, fevers or discharge please return to clinic for further evaluation Follow-up PCP as needed

## 2021-10-08 NOTE — Assessment & Plan Note (Signed)
Pathology report from left breast biopsy 06/5 ABUNDANT CHRONIC INFLAMMATION, FOREIGN BODY GIANT CELL REACTION TO SECRETED TYPE MATERIAL, FOAMY HISTIOCYTES AND AREAS OF ACUTE INFLAMMATION CONSISTENT WITH FOCAL ABSCESS FORMATION. I called breast center to clarify if any further drainage was needed was unable to speak with someone for clarification. Given patient has been afebrile, pain is reduced and no discharge from left breast will not start antibiotics today. Strict return precautions provided. Follow-up with PCP as needed.

## 2021-10-14 ENCOUNTER — Telehealth: Payer: Self-pay

## 2021-10-14 NOTE — Telephone Encounter (Signed)
Patient calls nurse line with questions regarding last office visit on 6/27. Reports that Dr. Volanda Napoleon told her that she would call her and let her know if she needed to start antibiotics. Patient has not heard from provider regarding concern.   Patient reports that she continues to feel "knot" and is concerned that it may have gotten bigger. Denies fever, redness or severe pain.   Biopsy report indicated that area had inflammation consistent with focal abscess formation.   Will forward to PCP for next steps.   Talbot Grumbling, RN

## 2021-10-26 ENCOUNTER — Telehealth: Payer: Self-pay | Admitting: Student

## 2021-10-26 NOTE — Telephone Encounter (Signed)
Patient came in stating that she still hasn't heard from the doctor about if she needs to start taking meds for her breast pain. She did make an appt to be seen on 10/29/21.

## 2021-10-29 ENCOUNTER — Other Ambulatory Visit: Payer: Self-pay | Admitting: Family Medicine

## 2021-10-29 ENCOUNTER — Ambulatory Visit (INDEPENDENT_AMBULATORY_CARE_PROVIDER_SITE_OTHER): Payer: Medicaid Other | Admitting: Family Medicine

## 2021-10-29 VITALS — BP 135/81 | HR 96 | Ht 61.0 in | Wt 232.6 lb

## 2021-10-29 DIAGNOSIS — N644 Mastodynia: Secondary | ICD-10-CM

## 2021-10-29 NOTE — Patient Instructions (Addendum)
It was great to see you!  For your breast: There are no concerning findings on exam today. We will repeat an ultrasound to see if the abscess has changed at all. There is no need for antibiotics at this time.  I will place a referral to the breast surgeon for additional evaluation.  Take care, Dr Rock Nephew

## 2021-10-29 NOTE — Progress Notes (Unsigned)
    SUBJECTIVE:   CHIEF COMPLAINT / HPI:   Left Breast Pain -ongoing for a few months -had left breast biopsy 09/13/21 which showed abundant chronic inflammation and an area of acute inflammation c/w focal abscess -was told to come to PCP to discuss potential need for antibiotics -patient has pain when laying prone -it is unchanged since her biopsy on 6/5 -no fevers, no chills, no drainage, no skin changes that she's aware of -she can occasionally feel a nodule when she positions herself a certain way  PERTINENT  PMH / PSH: schizophrenia  OBJECTIVE:   BP 135/81   Pulse 96   Ht '5\' 1"'$  (1.549 m)   Wt 232 lb 9.6 oz (105.5 kg)   SpO2 100%   BMI 43.95 kg/m   General: NAD, pleasant, able to participate in exam Respiratory: No respiratory distress Skin: warm and dry, no rashes noted Psych: Normal affect and mood Neuro: grossly intact Breast: exam performed in the presence of a chaperone (Dr. Thompson Grayer). No skin changes. No increased warmth. No palpable nodules or masses. Mild TTP in L breast at medial subareolar region.  ASSESSMENT/PLAN:   Breast pain in female Had biopsy showing chronic inflammation as well as area c/w focal abscess. Given that biopsy was obtained nearly 2 months ago and patient has not had fever, chills, or worsening pain, and exam was unremarkable today, do not feel antibiotics are indicated. Will obtain repeat mammogram and ultrasound to evaluate whether area of concern has changed at all. Referral to breast surgeon for further evaluation.     Alcus Dad, MD [place]

## 2021-10-31 NOTE — Assessment & Plan Note (Signed)
Had biopsy showing chronic inflammation as well as area c/w focal abscess. Given that biopsy was obtained nearly 2 months ago and patient has not had fever, chills, or worsening pain, and exam was unremarkable today, do not feel antibiotics are indicated. Will obtain repeat mammogram and ultrasound to evaluate whether area of concern has changed at all. Referral to breast surgeon for further evaluation.

## 2021-11-03 ENCOUNTER — Ambulatory Visit
Admission: RE | Admit: 2021-11-03 | Discharge: 2021-11-03 | Disposition: A | Discharge status: Home / Self Care | Payer: Medicaid Other | Source: Ambulatory Visit | Attending: Family Medicine | Admitting: Family Medicine

## 2021-11-03 ENCOUNTER — Ambulatory Visit: Payer: Medicaid Other

## 2021-11-03 DIAGNOSIS — N644 Mastodynia: Secondary | ICD-10-CM

## 2021-11-08 ENCOUNTER — Institutional Professional Consult (permissible substitution): Payer: Medicaid Other | Admitting: Plastic Surgery

## 2021-11-10 ENCOUNTER — Other Ambulatory Visit: Payer: Self-pay | Admitting: Family Medicine

## 2021-11-10 DIAGNOSIS — N611 Abscess of the breast and nipple: Secondary | ICD-10-CM

## 2021-11-17 ENCOUNTER — Ambulatory Visit
Admission: RE | Admit: 2021-11-17 | Discharge: 2021-11-17 | Disposition: A | Discharge status: Home / Self Care | Payer: Medicaid Other | Source: Ambulatory Visit | Attending: Family Medicine | Admitting: Family Medicine

## 2021-11-17 DIAGNOSIS — N611 Abscess of the breast and nipple: Secondary | ICD-10-CM

## 2021-12-24 ENCOUNTER — Encounter: Payer: Self-pay | Admitting: Plastic Surgery

## 2021-12-24 ENCOUNTER — Ambulatory Visit (INDEPENDENT_AMBULATORY_CARE_PROVIDER_SITE_OTHER): Payer: Medicaid Other

## 2021-12-24 ENCOUNTER — Ambulatory Visit: Payer: Medicaid Other | Admitting: Plastic Surgery

## 2021-12-24 VITALS — BP 120/84 | HR 78 | Ht 61.0 in | Wt 233.4 lb

## 2021-12-24 DIAGNOSIS — N644 Mastodynia: Secondary | ICD-10-CM | POA: Diagnosis not present

## 2021-12-24 DIAGNOSIS — Z3042 Encounter for surveillance of injectable contraceptive: Secondary | ICD-10-CM | POA: Diagnosis present

## 2021-12-24 MED ORDER — MEDROXYPROGESTERONE ACETATE 150 MG/ML IM SUSP
150.0000 mg | Freq: Once | INTRAMUSCULAR | Status: AC
  Administered 2021-12-24: 150 mg via INTRAMUSCULAR

## 2021-12-24 NOTE — Progress Notes (Signed)
Patient here today for Depo Provera injection and is within her dates.    Last contraceptive appt was 07/12/21  Depo given in RD today, per patient request.  Site unremarkable & patient tolerated injection.    Next injection due 12/1-12/15.  Reminder card given.    Talbot Grumbling, RN

## 2021-12-25 NOTE — Progress Notes (Signed)
   Referring Provider Holley Bouche, MD [place],  [place] [id number]   CC:  Left breast pain   Anna Moses is an 46 y.o. female.  HPI: 46 year old with left breast pain.  She had a biopsy in April 2023 which showed an abscess.  She has been treated with antibiotics and drainage but is noting that she still has some pain.  She is up-to-date on her mammography at the breast center.    No Known Allergies  Outpatient Encounter Medications as of 12/24/2021  Medication Sig   diclofenac Sodium (VOLTAREN) 1 % GEL Apply 4 g topically 4 (four) times daily. (Patient not taking: Reported on 12/24/2021)   lidocaine (LIDODERM) 5 % Place 1 patch onto the skin daily. Remove & Discard patch within 12 hours or as directed by MD (Patient not taking: Reported on 12/24/2021)   medroxyPROGESTERone (DEPO-PROVERA) 150 MG/ML injection Inject 150 mg into the muscle every 3 (three) months. (Patient not taking: Reported on 12/24/2021)   pantoprazole (PROTONIX) 40 MG tablet TAKE 1 TABLET(40 MG) BY MOUTH TWICE DAILY BEFORE A MEAL (Patient not taking: Reported on 12/24/2021)   [DISCONTINUED] diphenhydrAMINE (BENADRYL) 25 MG tablet Take 1 tablet (25 mg total) by mouth every 6 (six) hours. (Patient not taking: Reported on 12/12/2018)   [DISCONTINUED] Ibuprofen-diphenhydrAMINE Cit (ADVIL PM) 200-38 MG TABS Take 2 tablets by mouth 2 (two) times daily as needed (headaches).   No facility-administered encounter medications on file as of 12/24/2021.     Past Medical History:  Diagnosis Date   Asthma    Bronchitis    Depression    Obesity    Schizophrenia ([place])     Past Surgical History:  Procedure Laterality Date   CESAREAN SECTION     COLONOSCOPY  06/27/2019   UPPER GASTROINTESTINAL ENDOSCOPY  06/27/2019    Family History  Problem Relation Age of Onset   Crohn's disease Mother    Liver disease Father    Clotting disorder Father    Liver disease Brother    Colon cancer Neg Hx    Esophageal  cancer Neg Hx    Rectal cancer Neg Hx    Stomach cancer Neg Hx    Breast cancer Neg Hx     Social History   Social History Narrative   Not on file     Review of Systems General: Denies fevers, chills, weight loss CV: Denies chest pain, shortness of breath, palpitations   Physical Exam    12/24/2021    2:28 PM 10/29/2021    3:19 PM 10/05/2021   10:05 AM  Vitals with BMI  Height '5\' 1"'$  '5\' 1"'$  '5\' 1"'$   Weight 233 lbs 6 oz 232 lbs 10 oz 230 lbs  BMI 44.12 53.66 44.03  Systolic 474 259 563  Diastolic 84 81 70  Pulse 78 96 93    General:  No acute distress,  Alert and oriented, Non-Toxic, Normal speech and affect Breast: Bilateral macromastia with significant ptosis.  Mild swelling and tenderness under left nipple areolar complex.  Assessment/Plan Patient may be a candidate for breast reduction in the future but at the current time after recent infection and abscess I do not think that we should proceed within the next 6 months.  If she has additional problems with breast pain or abscess I recommend that she see [facility] surgery or another similar group.    [name] 12/25/2021, 7:41 PM

## 2022-03-14 NOTE — Progress Notes (Signed)
    SUBJECTIVE:   CHIEF COMPLAINT / HPI:   Vaginal Discharge: Patient is a 46 y.o. female presenting with vaginal discharge for *** days.  She states the discharge is of *** consistency.  She endorses *** vaginal odor.  She is not *** interested in screening for sexually transmitted infections today. She has contraception with *** {Contraceptives:21111124}. She {DOES NOT does:27190::"does not"} use barrier method consistently.  PERTINENT  PMH / PSH: ***None relevant  OBJECTIVE:   There were no vitals taken for this visit.   General: NAD, pleasant, able to participate in exam Respiratory: Normal effort, no obvious respiratory distress Pelvic: VULVA: normal appearing vulva with no masses, tenderness or lesions, VAGINA: Normal appearing vagina with normal color, no lesions, with {GYN VAGINAL DISCHARGE:21986} discharge present, ***CERVIX: No lesions, {GYN VAGINAL DISCHARGE:21986} discharge present  Chaperone *** CMA present for pelvic exam  ASSESSMENT/PLAN:   No problem-specific Assessment & Plan notes found for this encounter.   Assessment:  46 y.o. female with vaginal discharge for***days, as well as***.  Physical exam significant for*** discharge.  Wet prep performed today shows *** consistent with ***.  Patient is not interested in STI screening.   Plan: -Wet prep as above.  Will treat with***. -Discussed protection during intercourse and contraceptive methods*** -Follow-up as needed  Gerrit Heck, MD [place]

## 2022-03-15 ENCOUNTER — Encounter: Payer: Self-pay | Admitting: Student

## 2022-03-15 ENCOUNTER — Ambulatory Visit (INDEPENDENT_AMBULATORY_CARE_PROVIDER_SITE_OTHER): Payer: Medicaid Other | Admitting: Student

## 2022-03-15 ENCOUNTER — Other Ambulatory Visit (HOSPITAL_COMMUNITY)
Admission: RE | Admit: 2022-03-15 | Discharge: 2022-03-15 | Disposition: A | Discharge status: Home / Self Care | Payer: Medicaid Other | Source: Ambulatory Visit | Attending: Family Medicine | Admitting: Family Medicine

## 2022-03-15 DIAGNOSIS — Z113 Encounter for screening for infections with a predominantly sexual mode of transmission: Secondary | ICD-10-CM

## 2022-03-15 LAB — POCT WET PREP (WET MOUNT)
Clue Cells Wet Prep Whiff POC: POSITIVE
Trichomonas Wet Prep HPF POC: ABSENT

## 2022-03-15 MED ORDER — MEDROXYPROGESTERONE ACETATE 150 MG/ML IM SUSP
150.0000 mg | Freq: Once | INTRAMUSCULAR | Status: AC
  Administered 2022-03-15: 150 mg via INTRAMUSCULAR

## 2022-03-15 NOTE — Patient Instructions (Addendum)
It was great to see you! Thank you for allowing me to participate in your care!   Our plans for today:  - I will let you know what your results show - please sign up for mychart! - Return in 3 months for another Depo shot  Take care and seek immediate care sooner if you develop any concerns.  Gerrit Heck, MD

## 2022-03-16 ENCOUNTER — Encounter: Payer: Self-pay | Admitting: Student

## 2022-03-16 LAB — CERVICOVAGINAL ANCILLARY ONLY
Chlamydia: NEGATIVE
Comment: NEGATIVE
Comment: NORMAL
Neisseria Gonorrhea: NEGATIVE

## 2022-03-16 LAB — HIV ANTIBODY (ROUTINE TESTING W REFLEX): HIV Screen 4th Generation wRfx: NONREACTIVE

## 2022-03-16 LAB — RPR: RPR Ser Ql: NONREACTIVE

## 2022-03-29 ENCOUNTER — Ambulatory Visit (INDEPENDENT_AMBULATORY_CARE_PROVIDER_SITE_OTHER): Payer: Medicaid Other | Admitting: Sports Medicine

## 2022-03-29 ENCOUNTER — Ambulatory Visit: Payer: Medicaid Other | Admitting: Sports Medicine

## 2022-03-29 VITALS — BP 121/69 | Ht 61.0 in | Wt 230.0 lb

## 2022-03-29 DIAGNOSIS — M25562 Pain in left knee: Secondary | ICD-10-CM | POA: Diagnosis present

## 2022-03-29 MED ORDER — MELOXICAM 15 MG PO TABS: ORAL_TABLET | ORAL | 0 refills | Status: DC

## 2022-03-29 MED ORDER — METHYLPREDNISOLONE ACETATE 40 MG/ML IJ SUSP
40.0000 mg | Freq: Once | INTRAMUSCULAR | Status: AC
Start: 2022-03-29 — End: 2022-03-29
  Administered 2022-03-29: 40 mg via INTRA_ARTICULAR

## 2022-03-29 MED ORDER — DICLOFENAC SODIUM 75 MG PO TBEC: DELAYED_RELEASE_TABLET | ORAL | 0 refills | Status: DC

## 2022-03-29 NOTE — Patient Instructions (Addendum)
It was wonderful to see you today.  Today we talked about:  -We are ordering imaging of your knee.  -You can walk in to have the x-ray done. Schedule the MRI while you are there -We have sent in an anti-inflammatory medication called Voltaren -Your knee may feel a little worse in the next 1 to 2 days due to the extra fluid that we injected. Try to rest -Reasons to return: signs of infection such as fever, redness, increased pain or swelling -We'll touch base with you when the results return and schedule follow up as needed  Take care, Sharion Settler, DO

## 2022-03-29 NOTE — Progress Notes (Addendum)
PCP: Holley Bouche, MD  Subjective:   HPI: Patient is a 46 y.o. female here for evaluation of left knee pain.  Patient reports that she was seen here in the past for similar concerns.  Upon chart review, appears she was seen in May 2022 for acute lateral left knee pain.  She did have an aspiration with approximately 20 cc of clear yellow fluid drained and received a corticosteroid injection at that time.  She has a remote history of reported left lateral meniscus tear (unable to confirm with previous imaging and no previous hx of MRI) and reports she has been receiving intermittent steroid injections for this. She previously lived in [place] and reported that she received these routinely.  The steroid injections seem to give her several months of relief.  She reports that her left knee has been flared for the last week.  She had noticed swelling starting 3 days ago.  She has not taken any medications for this.  She occasionally hears her bones "popping".  Is difficult for her to walk due to the discomfort and swelling.  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 She has no recent knee x-rays.    PMHx reviewed Medications reviewed Allergies reviewed   Objective:  Physical Exam: Blood pressure 121/69, height '5\' 1"'$  (1.549 m), weight 230 lb (104.3 kg).  Gen: NAD, comfortable in exam room  Knee, Left: TTP noted at the medial patella and medial joint line. Inspection was negative for erythema, ecchymosis. There were no surgical scars identified. There was mild to moderate effusion present. No obvious bony abnormalities or signs of osteophyte development. Palpation yielded no asymmetric warmth; Discomfort noted to medial joint line ; No condyle tenderness; +medial patellar tenderness; No patellar crepitus. Patellar and quadriceps tendons unremarkable, and no tenderness of the pes anserine bursa. No obvious Baker's cyst  development. ROM decreased in flexion (100 degrees) and normal in extension (0 degrees). Normal hamstring and quadriceps strength. Neurovascularly intact bilaterally.  - Ligaments: (Solid and consistent endpoints)   - ACL (present bilaterally)   - PCL (present bilaterally)   - LCL (present bilaterally)   - MCL (present bilaterally).   - Additional tests performed:    - Anterior Drawer >> NEG  - Meniscus:   - McMurray's: NEG  - Patella:   - Patellar grind/compression: NEG   - Patellar glide: Without apprehension  PROCEDURE: INJECTION: Patient was given informed consent, signed copy in the chart. Appropriate time out was taken. Area prepped and draped in usual sterile fashion. Ethyl chloride was  used for local anesthesia. A 25 gauge 1 1/2 inch needle was used.. 1 cc of methylprednisolone 40 mg/ml plus  3 cc of 1% lidocaine without epinephrine was injected into the left knee using a(n) anterolateral approach.   The patient tolerated the procedure well. There were no complications. Post procedure instructions were given.    Assessment & Plan:  1. Acute on Chronic Left Knee Pain Differential includes flare of OA vs meniscal injury or ligamentous injury. Will investigate causes for her continued and intermittent flares. Suspect she likely does have component of OA.  -3:1 L knee corticosteroid injection -Imaging: X-ray and MRI -Diclofenac 75 mg BID x5 days -Rest, ice, compression sleeve   Patient seen and evaluated with the resident.  I agree with the above plan of care.  Patient has received multiple cortisone injections over the year with no  real clear etiology for her returning knee pain.  We will get imaging starting with an x-ray and then proceeding with an MRI specifically to rule out meniscus pathology that may need arthroscopic intervention.  Phone follow-up with MRI results when available.  In the meantime we tried to prescribe diclofenac but her insurance requires prior authorization  so we will change that to meloxicam 15 mg daily for 5 days and then as needed.  This note was dictated using Dragon naturally speaking software and may contain errors in syntax, spelling, or content which have not been identified prior to signing this note.   Addendum: April 13, 2022 X-rays of the left knee evaluated.  There is evidence of a possible remote avulsion fracture from the MCL origin but nothing acute is seen.  No degenerative changes.  No effusion.  Proceed with MRI as scheduled and I will follow-up with those results when available.

## 2022-04-06 ENCOUNTER — Ambulatory Visit
Admission: RE | Admit: 2022-04-06 | Discharge: 2022-04-06 | Disposition: A | Discharge status: Home / Self Care | Payer: Medicaid Other | Source: Ambulatory Visit | Attending: Sports Medicine | Admitting: Sports Medicine

## 2022-04-06 DIAGNOSIS — M25562 Pain in left knee: Secondary | ICD-10-CM

## 2022-04-23 ENCOUNTER — Ambulatory Visit
Admission: RE | Admit: 2022-04-23 | Discharge: 2022-04-23 | Disposition: A | Discharge status: Home / Self Care | Payer: Medicaid Other | Source: Ambulatory Visit | Attending: Sports Medicine | Admitting: Sports Medicine

## 2022-04-23 DIAGNOSIS — M25562 Pain in left knee: Secondary | ICD-10-CM

## 2022-04-28 ENCOUNTER — Encounter: Payer: Self-pay | Admitting: Sports Medicine

## 2022-05-18 ENCOUNTER — Encounter (HOSPITAL_COMMUNITY): Payer: Self-pay

## 2022-05-18 ENCOUNTER — Ambulatory Visit (HOSPITAL_COMMUNITY)
Admission: EM | Admit: 2022-05-18 | Discharge: 2022-05-18 | Disposition: A | Discharge status: Home / Self Care | Payer: Medicaid Other | Attending: Internal Medicine | Admitting: Internal Medicine

## 2022-05-18 DIAGNOSIS — F17210 Nicotine dependence, cigarettes, uncomplicated: Secondary | ICD-10-CM | POA: Insufficient documentation

## 2022-05-18 DIAGNOSIS — J069 Acute upper respiratory infection, unspecified: Secondary | ICD-10-CM | POA: Insufficient documentation

## 2022-05-18 DIAGNOSIS — Z1152 Encounter for screening for COVID-19: Secondary | ICD-10-CM | POA: Diagnosis not present

## 2022-05-18 DIAGNOSIS — R059 Cough, unspecified: Secondary | ICD-10-CM | POA: Diagnosis not present

## 2022-05-18 DIAGNOSIS — R112 Nausea with vomiting, unspecified: Secondary | ICD-10-CM | POA: Insufficient documentation

## 2022-05-18 DIAGNOSIS — R0602 Shortness of breath: Secondary | ICD-10-CM | POA: Insufficient documentation

## 2022-05-18 DIAGNOSIS — F1290 Cannabis use, unspecified, uncomplicated: Secondary | ICD-10-CM | POA: Insufficient documentation

## 2022-05-18 DIAGNOSIS — J45909 Unspecified asthma, uncomplicated: Secondary | ICD-10-CM | POA: Insufficient documentation

## 2022-05-18 MED ORDER — ALBUTEROL SULFATE (2.5 MG/3ML) 0.083% IN NEBU
INHALATION_SOLUTION | RESPIRATORY_TRACT | Status: AC
  Filled 2022-05-18: qty 3

## 2022-05-18 MED ORDER — ALBUTEROL SULFATE (2.5 MG/3ML) 0.083% IN NEBU
2.5000 mg | INHALATION_SOLUTION | Freq: Once | RESPIRATORY_TRACT | Status: AC
  Administered 2022-05-18: 2.5 mg via RESPIRATORY_TRACT

## 2022-05-18 MED ORDER — ONDANSETRON 4 MG PO TBDP
4.0000 mg | ORAL_TABLET | Freq: Once | ORAL | Status: AC
  Administered 2022-05-18: 4 mg via ORAL

## 2022-05-18 MED ORDER — ALBUTEROL SULFATE HFA 108 (90 BASE) MCG/ACT IN AERS: 1.0000 | INHALATION_SPRAY | Freq: Four times a day (QID) | RESPIRATORY_TRACT | 0 refills | Status: AC | PRN

## 2022-05-18 MED ORDER — KETOROLAC TROMETHAMINE 30 MG/ML IJ SOLN
30.0000 mg | Freq: Once | INTRAMUSCULAR | Status: AC
  Administered 2022-05-18: 30 mg via INTRAMUSCULAR

## 2022-05-18 MED ORDER — ONDANSETRON 4 MG PO TBDP: 4.0000 mg | ORAL_TABLET | Freq: Three times a day (TID) | ORAL | 0 refills | Status: DC | PRN

## 2022-05-18 MED ORDER — LIDOCAINE VISCOUS HCL 2 % MT SOLN
OROMUCOSAL | Status: AC
  Filled 2022-05-18: qty 15

## 2022-05-18 MED ORDER — LIDOCAINE VISCOUS HCL 2 % MT SOLN
15.0000 mL | Freq: Once | OROMUCOSAL | Status: AC
Start: 2022-05-18 — End: 2022-05-18
  Administered 2022-05-18: 15 mL via OROMUCOSAL

## 2022-05-18 MED ORDER — KETOROLAC TROMETHAMINE 30 MG/ML IJ SOLN
INTRAMUSCULAR | Status: AC
  Filled 2022-05-18: qty 1

## 2022-05-18 MED ORDER — ONDANSETRON 4 MG PO TBDP
ORAL_TABLET | ORAL | Status: AC
  Filled 2022-05-18: qty 1

## 2022-05-18 MED ORDER — BENZONATATE 100 MG PO CAPS: 100.0000 mg | ORAL_CAPSULE | Freq: Three times a day (TID) | ORAL | 0 refills | Status: DC

## 2022-05-18 MED ORDER — PROMETHAZINE-DM 6.25-15 MG/5ML PO SYRP: 5.0000 mL | ORAL_SOLUTION | Freq: Every evening | ORAL | 0 refills | Status: DC | PRN

## 2022-05-18 NOTE — ED Provider Notes (Signed)
[facility]    CSN: [id number] Arrival date & time: 05/18/22  1531      History   Chief Complaint Chief Complaint  Patient presents with   Emesis   Cough   Otalgia   Nausea    HPI Anna Moses is a 47 y.o. female.   Patient presents to urgent care for evaluation of cough, nasal congestion, nausea, vomiting, shortness of breath, chills, generalized fatigue, and sore throat that started abruptly 3 days ago on Sunday, February 4, 20234.  History of asthma, reports shortness of breath at this time.  She does not have access to an albuterol inhaler currently.  Denies recent antibiotic or steroid use.  Denies orthopnea, leg swelling, rash, and dizziness.  She has had a few episodes of nonbloody/nonbilious emesis over the last 24 hours.  Cough is productive with yellow sputum.  Sore throat makes it very difficult and painful to swallow.  Headache is generalized and currently an 8 on a scale of 0-10.  No dizziness or vision changes reported.  No changes in phonation.  Denies chest pain, heart palpitations, unilateral extremity weakness, or numbness or tingling to the bilateral lower extremities.  She has been using over-the-counter medications without relief of symptoms.  She is a cigarette smoker and intermittently smokes marijuana, denies other drug use.   Emesis Associated symptoms: cough   Cough Associated symptoms: ear pain   Otalgia Associated symptoms: cough and vomiting     Past Medical History:  Diagnosis Date   Asthma    Bronchitis    Depression    Obesity    Schizophrenia [facility])     Patient Active Problem List   Diagnosis Date Noted   Foreign body in left ear 10/08/2021   Hematuria 09/09/2021   Breast pain in female 08/29/2021   Muscle strain 08/13/2021   Lower back pain 07/14/2021   Contraception management 11/17/2020   Depression, recurrent ([place]) 11/17/2020   Effusion of left knee 08/15/2020   Cervical cancer screening 09/18/2019   Routine  screening for STI (sexually transmitted infection) 96/75/9163   Cyclical vomiting 84/66/5993   Schizophrenia ([place]) 07/24/2019   H. pylori infection 07/24/2019   Seasonal allergies 07/24/2019   Dysphagia 06/03/2019   Nausea and vomiting 06/03/2019   Liver lesion 06/03/2019   Preop examination 06/03/2019    Past Surgical History:  Procedure Laterality Date   CESAREAN SECTION     COLONOSCOPY  06/27/2019   UPPER GASTROINTESTINAL ENDOSCOPY  06/27/2019    OB History   No obstetric history on file.      Home Medications    Prior to Admission medications   Medication Sig Start Date End Date Taking? Authorizing Provider  albuterol (VENTOLIN HFA) 108 (90 Base) MCG/ACT inhaler Inhale 1-2 puffs into the lungs every 6 (six) hours as needed for wheezing or shortness of breath. 05/18/22  Yes Talbot Grumbling, FNP  benzonatate (TESSALON) 100 MG capsule Take 1 capsule (100 mg total) by mouth every 8 (eight) hours. 05/18/22  Yes Talbot Grumbling, FNP  ondansetron (ZOFRAN-ODT) 4 MG disintegrating tablet Take 1 tablet (4 mg total) by mouth every 8 (eight) hours as needed for nausea or vomiting. 05/18/22  Yes Talbot Grumbling, FNP  promethazine-dextromethorphan (PROMETHAZINE-DM) 6.25-15 MG/5ML syrup Take 5 mLs by mouth at bedtime as needed for cough. 05/18/22  Yes Talbot Grumbling, FNP  diclofenac Sodium (VOLTAREN) 1 % GEL Apply 4 g topically 4 (four) times daily. Patient not taking: Reported on 12/24/2021 07/12/21  Carollee Leitz, MD  lidocaine (LIDODERM) 5 % Place 1 patch onto the skin daily. Remove & Discard patch within 12 hours or as directed by MD Patient not taking: Reported on 12/24/2021 08/10/21   Carollee Leitz, MD  medroxyPROGESTERone (DEPO-PROVERA) 150 MG/ML injection Inject 150 mg into the muscle every 3 (three) months. Patient not taking: Reported on 12/24/2021    [provider]  meloxicam (MOBIC) 15 MG tablet Take 1 tablet daily with food for 5 days. Then take as needed.  03/29/22   Draper, Carlos Levering, DO  pantoprazole (PROTONIX) 40 MG tablet TAKE 1 TABLET(40 MG) BY MOUTH TWICE DAILY BEFORE A MEAL Patient not taking: Reported on 12/24/2021 04/21/21   Esterwood, Amy S, PA-C  diphenhydrAMINE (BENADRYL) 25 MG tablet Take 1 tablet (25 mg total) by mouth every 6 (six) hours. Patient not taking: Reported on 12/12/2018 10/21/17 05/30/19  Charlesetta Shanks, MD  Ibuprofen-diphenhydrAMINE Cit (ADVIL PM) 200-38 MG TABS Take 2 tablets by mouth 2 (two) times daily as needed (headaches).  05/30/19  [provider]    Family History Family History  Problem Relation Age of Onset   Crohn's disease Mother    Liver disease Father    Clotting disorder Father    Liver disease Brother    Colon cancer Neg Hx    Esophageal cancer Neg Hx    Rectal cancer Neg Hx    Stomach cancer Neg Hx    Breast cancer Neg Hx     Social History Social History   Tobacco Use   Smoking status: Some Days    Packs/day: 0.50    Types: Cigarettes   Smokeless tobacco: Never  Vaping Use   Vaping Use: Never used  Substance Use Topics   Alcohol use: No   Drug use: Not Currently    Types: Marijuana    Comment: occ     Allergies   Patient has no known allergies.   Review of Systems Review of Systems  HENT:  Positive for ear pain.   Respiratory:  Positive for cough.   Gastrointestinal:  Positive for vomiting.  Per HPI   Physical Exam Triage Vital Signs ED Triage Vitals  Enc Vitals Group     BP 05/18/22 1603 138/87     Pulse Rate 05/18/22 1603 98     Resp 05/18/22 1603 (!) 28     Temp 05/18/22 1603 98.3 F (36.8 C)     Temp src --      SpO2 05/18/22 1603 97 %     Weight --      Height --      Head Circumference --      Peak Flow --      Pain Score 05/18/22 1602 10     Pain Loc --      Pain Edu? --      Excl. in [place]? --    No data found.  Updated Vital Signs BP 138/87   Pulse 98   Temp 98.3 F (36.8 C)   Resp (!) 28   LMP  (LMP Unknown) Comment: depo  SpO2 97%    Visual Acuity Right Eye Distance:   Left Eye Distance:   Bilateral Distance:    Right Eye Near:   Left Eye Near:    Bilateral Near:     Physical Exam Vitals and nursing note reviewed.  Constitutional:      Appearance: She is ill-appearing. She is not toxic-appearing.  HENT:     Head: Normocephalic and  atraumatic.     Right Ear: Hearing, tympanic membrane, ear canal and external ear normal.     Left Ear: Hearing, tympanic membrane, ear canal and external ear normal.     Nose: Congestion present.     Mouth/Throat:     Lips: Pink.     Mouth: Mucous membranes are moist.     Dentition: Normal dentition.     Tongue: No lesions.     Palate: No mass.     Pharynx: Oropharynx is clear. Uvula midline. No oropharyngeal exudate, posterior oropharyngeal erythema or uvula swelling.     Tonsils: No tonsillar exudate or tonsillar abscesses.     Comments: No changes in phonation or tonsillar swelling.  No appreciable abscess to the posterior oropharynx. Eyes:     General: Lids are normal. Vision grossly intact. Gaze aligned appropriately.     Extraocular Movements: Extraocular movements intact.     Conjunctiva/sclera: Conjunctivae normal.  Cardiovascular:     Rate and Rhythm: Normal rate and regular rhythm.     Heart sounds: Normal heart sounds, S1 normal and S2 normal.  Pulmonary:     Effort: Pulmonary effort is normal. No respiratory distress.     Breath sounds: Normal breath sounds and air entry.  Abdominal:     General: Bowel sounds are normal.     Palpations: Abdomen is soft.     Tenderness: There is no abdominal tenderness. There is no right CVA tenderness, left CVA tenderness or guarding.  Musculoskeletal:     Cervical back: Neck supple.  Lymphadenopathy:     Cervical: Cervical adenopathy present.  Skin:    General: Skin is warm and dry.     Capillary Refill: Capillary refill takes less than 2 seconds.     Findings: No rash.  Neurological:     General: No focal deficit  present.     Mental Status: She is alert and oriented to person, place, and time. Mental status is at baseline.     Cranial Nerves: No dysarthria or facial asymmetry.  Psychiatric:        Mood and Affect: Mood normal.        Speech: Speech normal.        Behavior: Behavior normal.        Thought Content: Thought content normal.        Judgment: Judgment normal.      UC Treatments / Results  Labs (all labs ordered are listed, but only abnormal results are displayed) Labs Reviewed  SARS CORONAVIRUS 2 (TAT 6-24 HRS)    EKG   Radiology No results found.  Procedures Procedures (including critical care time)  Medications Ordered in UC Medications  ketorolac (TORADOL) 30 MG/ML injection 30 mg (30 mg Intramuscular Given 05/18/22 1659)  ondansetron (ZOFRAN-ODT) disintegrating tablet 4 mg (4 mg Oral Given 05/18/22 1655)  albuterol (PROVENTIL) (2.5 MG/3ML) 0.083% nebulizer solution 2.5 mg (2.5 mg Nebulization Given 05/18/22 1655)  lidocaine (XYLOCAINE) 2 % viscous mouth solution 15 mL (15 mLs Mouth/Throat Given 05/18/22 1655)    Initial Impression / Assessment and Plan / UC Course  I have reviewed the triage vital signs and the nursing notes.  Pertinent labs & imaging results that were available during my care of the patient were reviewed by me and considered in my medical decision making (see chart for details).   1. Viral URI with cough Symptoms and physical exam consistent with a viral upper respiratory tract infection that will likely resolve with rest, fluids, and prescriptions  for symptomatic relief. Deferred imaging based on stable cardiopulmonary exam and hemodynamically stable vital signs.  COVID-19 testing is pending.  We will call patient if this is positive.  Quarantine guidelines discussed. Currently on day 4 of symptoms and does qualify for antiviral therapy. May have paxlovid (GFR >60 from May 2023 care everywhere).   Patient given ketorolac 30 mg IM (headache), Zofran 4 mg  ODT (nausea) and viscous lidocaine (sore throat).  No NSAID use until tomorrow morning due to the ketorolac injection.  She is nontoxic in appearance with hemodynamically stable vital signs.  Respirations reduced to 22 after albuterol breathing treatment provided in clinic with improvement in subjective shortness of breath and objective lung sounds. Promethazine DM, Tessalon Perles, albuterol inhaler, and Zofran sent to pharmacy for symptomatic relief to be taken as prescribed.  May continue taking over the counter medications as directed for further symptomatic relief.  Drowsiness precautions discussed regarding promethazine DM prescription.  Nonpharmacologic interventions for symptom relief provided and after visit summary below. Advised to push fluids to stay well hydrated while recovering from viral illness.   Discussed physical exam and available lab work findings in clinic with patient.  Counseled patient regarding appropriate use of medications and potential side effects for all medications recommended or prescribed today. Discussed red flag signs and symptoms of worsening condition,when to call the PCP office, return to urgent care, and when to seek higher level of care in the emergency department. Patient verbalizes understanding and agreement with plan. All questions answered. Patient discharged in stable condition.    Final Clinical Impressions(s) / UC Diagnoses   Final diagnoses:  Viral URI with cough  Nausea and vomiting, unspecified vomiting type  Shortness of breath     Discharge Instructions      You have a viral upper respiratory infection.  COVID-19 testing is pending. We will call you with results if positive. If your COVID test is positive, you must stay at home until day 6 of symptoms. On day 6, you may go out into public and go back to work, but you must wear a mask until day 11 of symptoms to prevent spread to others.  Use the following medicines to help with  symptoms: - Plain Mucinex (guaifenesin) over the counter as directed every 12 hours to thin mucous so that you are able to get it out of your body easier. Drink plenty of water while taking this medication so that it works well in your body (at least 8 cups a day).  - Tylenol 1,'000mg'$  and/or ibuprofen '600mg'$  every 6 hours with food as needed for aches/pains or fever/chills.  - Tessalon perles every 8 hours as needed for cough. - Take Promethazine DM cough medication to help with your cough at nighttime so that you are able to sleep. Do not drive, drink alcohol, or go to work while taking this medication since it can make you sleepy. Only take this at nighttime.  - Zofran as needed for nausea and vomiting every 8 hours.  - Albuterol inhaler every 4-6 hours as needed for cough, shortness of breath, and wheeze.  1 tablespoon of honey in warm water and/or salt water gargles may also help with symptoms. Humidifier to your room will help add water to the air and reduce coughing.  If you develop any new or worsening symptoms, please return.  If your symptoms are severe, please go to the emergency room.  Follow-up with your primary care provider for further evaluation and management of your  symptoms as well as ongoing wellness visits.  I hope you feel better!      ED Prescriptions     Medication Sig Dispense Auth. Provider   albuterol (VENTOLIN HFA) 108 (90 Base) MCG/ACT inhaler Inhale 1-2 puffs into the lungs every 6 (six) hours as needed for wheezing or shortness of breath. 8 g Joella Prince M, FNP   benzonatate (TESSALON) 100 MG capsule Take 1 capsule (100 mg total) by mouth every 8 (eight) hours. 21 capsule Talbot Grumbling, FNP   promethazine-dextromethorphan (PROMETHAZINE-DM) 6.25-15 MG/5ML syrup Take 5 mLs by mouth at bedtime as needed for cough. 118 mL Joella Prince M, FNP   ondansetron (ZOFRAN-ODT) 4 MG disintegrating tablet Take 1 tablet (4 mg total) by mouth every 8 (eight)  hours as needed for nausea or vomiting. 20 tablet Talbot Grumbling, FNP      PDMP not reviewed this encounter.   Talbot Grumbling, [place] 05/18/22 1712

## 2022-05-18 NOTE — Discharge Instructions (Addendum)
You have a viral upper respiratory infection.  COVID-19 testing is pending. We will call you with results if positive. If your COVID test is positive, you must stay at home until day 6 of symptoms. On day 6, you may go out into public and go back to work, but you must wear a mask until day 11 of symptoms to prevent spread to others.  Use the following medicines to help with symptoms: - Plain Mucinex (guaifenesin) over the counter as directed every 12 hours to thin mucous so that you are able to get it out of your body easier. Drink plenty of water while taking this medication so that it works well in your body (at least 8 cups a day).  - Tylenol 1,'000mg'$  and/or ibuprofen '600mg'$  every 6 hours with food as needed for aches/pains or fever/chills.  - Tessalon perles every 8 hours as needed for cough. - Take Promethazine DM cough medication to help with your cough at nighttime so that you are able to sleep. Do not drive, drink alcohol, or go to work while taking this medication since it can make you sleepy. Only take this at nighttime.  - Zofran as needed for nausea and vomiting every 8 hours.  - Albuterol inhaler every 4-6 hours as needed for cough, shortness of breath, and wheeze.  1 tablespoon of honey in warm water and/or salt water gargles may also help with symptoms. Humidifier to your room will help add water to the air and reduce coughing.  If you develop any new or worsening symptoms, please return.  If your symptoms are severe, please go to the emergency room.  Follow-up with your primary care provider for further evaluation and management of your symptoms as well as ongoing wellness visits.  I hope you feel better!

## 2022-05-18 NOTE — ED Triage Notes (Signed)
Pt presents to uc with co of facial pain, otalgia, neck pain, sore throat, sob, cough and congestion with vomiting for three days. Pt reports otc chloraseptic spray

## 2022-05-19 LAB — SARS CORONAVIRUS 2 (TAT 6-24 HRS): SARS Coronavirus 2: NEGATIVE

## 2022-05-20 ENCOUNTER — Other Ambulatory Visit: Payer: Self-pay

## 2022-05-20 ENCOUNTER — Emergency Department (HOSPITAL_BASED_OUTPATIENT_CLINIC_OR_DEPARTMENT_OTHER)
Admission: EM | Admit: 2022-05-20 | Discharge: 2022-05-20 | Disposition: A | Discharge status: Home / Self Care | Payer: Medicaid Other | Attending: Emergency Medicine | Admitting: Emergency Medicine

## 2022-05-20 ENCOUNTER — Emergency Department (HOSPITAL_BASED_OUTPATIENT_CLINIC_OR_DEPARTMENT_OTHER): Payer: Medicaid Other

## 2022-05-20 ENCOUNTER — Encounter (HOSPITAL_BASED_OUTPATIENT_CLINIC_OR_DEPARTMENT_OTHER): Payer: Self-pay | Admitting: Emergency Medicine

## 2022-05-20 DIAGNOSIS — Z20822 Contact with and (suspected) exposure to covid-19: Secondary | ICD-10-CM | POA: Diagnosis not present

## 2022-05-20 DIAGNOSIS — J029 Acute pharyngitis, unspecified: Secondary | ICD-10-CM | POA: Diagnosis not present

## 2022-05-20 DIAGNOSIS — R070 Pain in throat: Secondary | ICD-10-CM | POA: Diagnosis present

## 2022-05-20 LAB — RESP PANEL BY RT-PCR (RSV, FLU A&B, COVID)  RVPGX2
Influenza A by PCR: NEGATIVE
Influenza B by PCR: NEGATIVE
Resp Syncytial Virus by PCR: NEGATIVE
SARS Coronavirus 2 by RT PCR: NEGATIVE

## 2022-05-20 LAB — GROUP A STREP BY PCR: Group A Strep by PCR: NOT DETECTED

## 2022-05-20 MED ORDER — HYDROCODONE BIT-HOMATROP MBR 5-1.5 MG/5ML PO SOLN: 5.0000 mL | Freq: Four times a day (QID) | ORAL | 0 refills | Status: DC | PRN

## 2022-05-20 MED ORDER — AZITHROMYCIN 250 MG PO TABS: 250.0000 mg | ORAL_TABLET | Freq: Every day | ORAL | 0 refills | Status: DC

## 2022-05-20 MED ORDER — HYDROCOD POLI-CHLORPHE POLI ER 10-8 MG/5ML PO SUER
5.0000 mL | Freq: Once | ORAL | Status: AC
  Administered 2022-05-20: 5 mL via ORAL
  Filled 2022-05-20: qty 5

## 2022-05-20 MED ORDER — DEXAMETHASONE SODIUM PHOSPHATE 10 MG/ML IJ SOLN
20.0000 mg | Freq: Once | INTRAMUSCULAR | Status: AC
  Administered 2022-05-20: 20 mg via INTRAMUSCULAR
  Filled 2022-05-20: qty 2

## 2022-05-20 MED ORDER — HYDROCODONE BIT-HOMATROP MBR 5-1.5 MG/5ML PO SOLN: 5.0000 mL | Freq: Once | ORAL | Status: DC

## 2022-05-20 NOTE — ED Provider Notes (Signed)
[facility] Provider Note   CSN: [id number] Arrival date & time: 05/20/22  0200     History  Chief Complaint  Patient presents with   Sore Throat    Anna Moses is a 47 y.o. female.  48 year old female presents ER today with 6 to 7 days of throat pain.  Patient states that it progressively worsened over that time.  She has sinus pain and she went to urgent care couple days ago where she was diagnosed with pharyngitis but COVID test was negative.  Sent home with symptomatic treatment persistent symptoms so she came here today.  Pain in her anterior neck underneath her chin.  Frontal sinus pain, rhinorrhea, cough and fevers.   Sore Throat       Home Medications Prior to Admission medications   Medication Sig Start Date End Date Taking? Authorizing Provider  azithromycin (ZITHROMAX) 250 MG tablet Take 1 tablet (250 mg total) by mouth daily. Take first 2 tablets together, then 1 every day until finished. 05/20/22  Yes Eduin Friedel, Corene Cornea, MD  HYDROcodone bit-homatropine (HYCODAN) 5-1.5 MG/5ML syrup Take 5 mLs by mouth every 6 (six) hours as needed for cough. 05/20/22  Yes Skyler Carel, Corene Cornea, MD  albuterol (VENTOLIN HFA) 108 (90 Base) MCG/ACT inhaler Inhale 1-2 puffs into the lungs every 6 (six) hours as needed for wheezing or shortness of breath. 05/18/22   Talbot Grumbling, FNP  benzonatate (TESSALON) 100 MG capsule Take 1 capsule (100 mg total) by mouth every 8 (eight) hours. 05/18/22   Talbot Grumbling, FNP  meloxicam (MOBIC) 15 MG tablet Take 1 tablet daily with food for 5 days. Then take as needed. 03/29/22   Draper, Christia Reading R, DO  ondansetron (ZOFRAN-ODT) 4 MG disintegrating tablet Take 1 tablet (4 mg total) by mouth every 8 (eight) hours as needed for nausea or vomiting. 05/18/22   Talbot Grumbling, FNP  promethazine-dextromethorphan (PROMETHAZINE-DM) 6.25-15 MG/5ML syrup Take 5 mLs by mouth at bedtime as needed for cough. 05/18/22    Talbot Grumbling, FNP  diphenhydrAMINE (BENADRYL) 25 MG tablet Take 1 tablet (25 mg total) by mouth every 6 (six) hours. Patient not taking: Reported on 12/12/2018 10/21/17 05/30/19  Charlesetta Shanks, MD  Ibuprofen-diphenhydrAMINE Cit (ADVIL PM) 200-38 MG TABS Take 2 tablets by mouth 2 (two) times daily as needed (headaches).  05/30/19  [provider]      Allergies    Patient has no known allergies.    Review of Systems   Review of Systems  Physical Exam Updated Vital Signs BP (!) 145/80   Pulse (!) 58   Temp 99 F (37.2 C) (Oral)   Resp (!) 28   LMP  (LMP Unknown) Comment: depo  SpO2 99%  Physical Exam Vitals and nursing note reviewed.  Constitutional:      Appearance: She is well-developed.  HENT:     Head: Normocephalic and atraumatic.     Right Ear: Tympanic membrane normal.     Left Ear: Tympanic membrane normal.     Mouth/Throat:     Pharynx: No pharyngeal swelling, oropharyngeal exudate or posterior oropharyngeal erythema.  Neck:     Comments: Anterior cervical lymphadenopathy Cardiovascular:     Rate and Rhythm: Normal rate and regular rhythm.  Pulmonary:     Effort: No respiratory distress.     Breath sounds: No stridor.  Abdominal:     General: There is no distension.  Musculoskeletal:     Cervical back: Normal range  of motion.  Skin:    General: Skin is warm and dry.  Neurological:     Mental Status: She is alert.     ED Results / Procedures / Treatments   Labs (all labs ordered are listed, but only abnormal results are displayed) Labs Reviewed  RESP PANEL BY RT-PCR (RSV, FLU A&B, COVID)  [id number]  GROUP A STREP BY PCR    EKG None  Radiology DG Chest Portable 1 View  Result Date: 05/20/2022 CLINICAL DATA:  47 year old female with history of sore throat and sinus and ear pressure. EXAM: PORTABLE CHEST 1 VIEW COMPARISON:  Chest x-ray 08/01/2021. FINDINGS: Lung volumes are normal. No consolidative airspace disease. No pleural effusions. No  pneumothorax. No pulmonary nodule or mass noted. Pulmonary vasculature and the cardiomediastinal silhouette are within normal limits. IMPRESSION: No radiographic evidence of acute cardiopulmonary disease. Electronically Signed   By: Vinnie Langton M.D.   On: 05/20/2022 05:04    Procedures Procedures    Medications Ordered in ED Medications  dexamethasone (DECADRON) injection 20 mg (20 mg Intramuscular Given 05/20/22 0432)  chlorpheniramine-HYDROcodone (TUSSIONEX) 10-8 MG/5ML suspension 5 mL (5 mLs Oral Given 05/20/22 0431)    ED Course/ Medical Decision Making/ A&P                             Medical Decision Making Amount and/or Complexity of Data Reviewed Radiology: ordered.  Risk Prescription drug management.   Pharyngitis of unclear etiology and likely sinusitis as well still in the range of likely/possibly viral however has been sick for about a week now so we will going initiate some antibiotics and give her some steroids for symptomatic treatment and cough medicine as well.  Otherwise patient has no evidence of PTA/RPA or commune acquired pneumonia.  Stable for discharge.  CXR done and showed no obvious pneumonia (independently viewed and interpreted by myself and radiology read reviewed).   Final Clinical Impression(s) / ED Diagnoses Final diagnoses:  Pharyngitis, unspecified etiology    Rx / DC Orders ED Discharge Orders          Ordered    azithromycin (ZITHROMAX) 250 MG tablet  Daily        05/20/22 0513    HYDROcodone bit-homatropine (HYCODAN) 5-1.5 MG/5ML syrup  Every 6 hours PRN        05/20/22 0513              Shaketta Rill, Corene Cornea, MD 05/20/22 [phone]

## 2022-05-20 NOTE — ED Triage Notes (Signed)
Sore throat, sinus and ear pressure. Facial pain URI symptoms. Started 05/15/2022 Seen at UC on 05/18/22 for same, reports that the meds they gave aren't helping

## 2022-05-20 NOTE — ED Notes (Signed)
Pt reports feeling better s/p cough med and steroid administration however states still painful to swallow.  Pt agreeable with d/c plan as discussed by provider- this nurse has verbally reinforced d/c instructions and provided pt with written copy. Pt acknowledges verbal understanding and denies any addl questions concerns needs- pt ambulatory independently at d/c (declined w/c) -- gait steady; no distress; vitals stable.

## 2022-05-30 ENCOUNTER — Other Ambulatory Visit: Payer: Self-pay

## 2022-05-30 ENCOUNTER — Encounter (HOSPITAL_BASED_OUTPATIENT_CLINIC_OR_DEPARTMENT_OTHER): Payer: Self-pay | Admitting: Orthopaedic Surgery

## 2022-05-31 ENCOUNTER — Encounter: Payer: Self-pay | Admitting: Gastroenterology

## 2022-06-01 NOTE — H&P (Signed)
PREOPERATIVE H&P  Chief Complaint: left knee medial meniscus tear, lateral release,chondroplasty  HPI: Anna Moses is a 47 y.o. female who is scheduled for, Procedure(s): KNEE ARTHROSCOPY WITH MEDIAL MENISECTOMY KNEE ARTHROSCOPY WITH LATERAL RELEASE CHONDROPLASTY.   Patient has a past medical history significant for asthma and schizophrenia.   The patient is a 47 year old female who has had pain in the knee for years.  She has pain over the lateral aspect of her patella. She has trouble with flexion and extension in the front of her knee.  She has tried and failed injections and physical therapy.  The injections provided her temporary relief.  Review of systems is negative.  Symptoms are rated as moderate to severe, and have been worsening.  This is significantly impairing activities of daily living.    Please see clinic note for further details on this patient's care.    She has elected for surgical management.   Past Medical History:  Diagnosis Date   Asthma    Bronchitis    Depression    Obesity    Schizophrenia ([place])    Past Surgical History:  Procedure Laterality Date   CESAREAN SECTION     COLONOSCOPY  06/27/2019   UPPER GASTROINTESTINAL ENDOSCOPY  06/27/2019   Social History   Socioeconomic History   Marital status: Single    Spouse name: Not on file   Number of children: 4   Years of education: Not on file   Highest education level: Not on file  Occupational History   Occupation: stay at home mother  Tobacco Use   Smoking status: Some Days    Packs/day: 0.50    Types: Cigarettes   Smokeless tobacco: Never  Vaping Use   Vaping Use: Never used  Substance and Sexual Activity   Alcohol use: Yes   Drug use: Not on file    Comment: occ   Sexual activity: Not on file  Other Topics Concern   Not on file  Social History Narrative   Not on file   Social Determinants of Health   Financial Resource Strain: Not on file  Food Insecurity: Not on  file  Transportation Needs: Not on file  Physical Activity: Not on file  Stress: Not on file  Social Connections: Not on file   Family History  Problem Relation Age of Onset   Crohn's disease Mother    Liver disease Father    Clotting disorder Father    Liver disease Brother    Colon cancer Neg Hx    Esophageal cancer Neg Hx    Rectal cancer Neg Hx    Stomach cancer Neg Hx    Breast cancer Neg Hx    No Known Allergies Prior to Admission medications   Medication Sig Start Date End Date Taking? Authorizing Provider  albuterol (VENTOLIN HFA) 108 (90 Base) MCG/ACT inhaler Inhale 1-2 puffs into the lungs every 6 (six) hours as needed for wheezing or shortness of breath. 05/18/22  Yes Talbot Grumbling, FNP  azithromycin (ZITHROMAX) 250 MG tablet Take 1 tablet (250 mg total) by mouth daily. Take first 2 tablets together, then 1 every day until finished. 05/20/22  Yes Mesner, Corene Cornea, MD  HYDROcodone bit-homatropine (HYCODAN) 5-1.5 MG/5ML syrup Take 5 mLs by mouth every 6 (six) hours as needed for cough. 05/20/22  Yes Mesner, Corene Cornea, MD  benzonatate (TESSALON) 100 MG capsule Take 1 capsule (100 mg total) by mouth every 8 (eight) hours. 05/18/22   Talbot Grumbling, FNP  meloxicam (MOBIC) 15 MG tablet Take 1 tablet daily with food for 5 days. Then take as needed. 03/29/22   Draper, Christia Reading R, DO  ondansetron (ZOFRAN-ODT) 4 MG disintegrating tablet Take 1 tablet (4 mg total) by mouth every 8 (eight) hours as needed for nausea or vomiting. 05/18/22   Talbot Grumbling, FNP  promethazine-dextromethorphan (PROMETHAZINE-DM) 6.25-15 MG/5ML syrup Take 5 mLs by mouth at bedtime as needed for cough. 05/18/22   Talbot Grumbling, FNP  diphenhydrAMINE (BENADRYL) 25 MG tablet Take 1 tablet (25 mg total) by mouth every 6 (six) hours. Patient not taking: Reported on 12/12/2018 10/21/17 05/30/19  Charlesetta Shanks, MD  Ibuprofen-diphenhydrAMINE Cit (ADVIL PM) 200-38 MG TABS Take 2 tablets by mouth 2 (two) times  daily as needed (headaches).  05/30/19  [provider]    ROS: All other systems have been reviewed and were otherwise negative with the exception of those mentioned in the HPI and as above.  Physical Exam: General: Alert, no acute distress Cardiovascular: No pedal edema Respiratory: No cyanosis, no use of accessory musculature GI: No organomegaly, abdomen is soft and non-tender Skin: No lesions in the area of chief complaint Neurologic: Sensation intact distally Psychiatric: Patient is competent for consent with normal mood and affect Lymphatic: No axillary or cervical lymphadenopathy  MUSCULOSKELETAL:  Range of motion of the knee is full.  She has no obvious effusion today.  She has obvious tilt of her patella on examination.  She has crepitus during range of motion.  No joint line tenderness.   Imaging: MRI demonstrates some cystic change in the subchondral bone of the patella.  There are no obvious large areas of cartilage loss.  She has increased patellar tilt laterally.  Assessment: left knee medial meniscus tear, lateral release,chondroplasty  Plan: Plan for Procedure(s): KNEE ARTHROSCOPY WITH MEDIAL MENISECTOMY KNEE ARTHROSCOPY WITH LATERAL RELEASE CHONDROPLASTY  The patient would benefit from a consideration of surgery at this point.  She has a difficult to treat problem. We discussed that her likelihood of success is 50-50 at best as far as long lasting relief that she is satisfied with in trying to manage her expectations.  We do think we can make her better as far as her mechanical type catching that she has with occasional movements additionally.  Her tilt and her tension in her lateral tissues will could be improved with a lateral release.  We will plan for a lateral release and a patellar chondroplasty.  She understands that she may have continued pain and need further procedures.  All questions were answered.  We will work on surgical scheduling.    The risks  benefits and alternatives were discussed with the patient including but not limited to the risks of nonoperative treatment, versus surgical intervention including infection, bleeding, nerve injury,  blood clots, cardiopulmonary complications, morbidity, mortality, among others, and they were willing to proceed.   The patient acknowledged the explanation, agreed to proceed with the plan and consent was signed.   Operative Plan: Left knee scope with lateral release and chondroplasty Discharge Medications: standard DVT Prophylaxis: aspirin Physical Therapy: outpatient PT Special Discharge needs: +/-   Ethelda Chick, PA-C  06/01/2022 5:30 PM

## 2022-06-01 NOTE — Discharge Instructions (Signed)
No Ibuprofen until 2:30 today if needed  Ophelia Charter MD, MPH Noemi Chapel, PA-C [facility] [address], [address] [phone] (tel)   [phone] (fax)   POST-OPERATIVE INSTRUCTIONS - Knee Arthroscopy  WOUND CARE - You may remove the Operative Dressing on Post-Op Day #3 (72hrs after surgery).   -  Alternatively if you would like you can leave dressing on until follow-up if within 7-8 days but keep it dry. - Leave steri-strips in place until they fall off on their own, usually 2 weeks postop. - An ACE wrap may be used to control swelling, do not wrap this too tight.  If the initial ACE wrap feels too tight you may loosen it. - There may be a small amount of fluid/bleeding leaking at the surgical site.  - This is normal; the knee is filled with fluid during the procedure and can leak for 24-48hrs after surgery. You may change/reinforce the bandage as needed.  - Use the Cryocuff or Ice as often as possible for the first 7 days, then as needed for pain relief. Always keep a towel, ACE wrap or other barrier between the cooling unit and your skin.  - You may shower on Post-Op Day #3. Gently pat the area dry.  - Do not soak the knee in water or submerge it.  - Do not go swimming in the pool or ocean until 4 weeks after surgery or when otherwise instructed.  Keep dry incisions as dry as possible.   BRACE/AMBULATION  -            You will not need a brace after this procedure.   - You may use crutches initially to help you weight bear, but this is not required - You can put full weight on your operative leg as you feel comfortable  PHYSICAL THERAPY - You will begin physical therapy soon after surgery (unless otherwise specified) - Please call to set up an appointment, if you do not already have one   - A PT referral was sent to [facility] on [place]- Multimodal approach to pain control In general your pain will be  controlled with a combination of substances.  Prescriptions unless otherwise discussed are electronically sent to your pharmacy.  This is a carefully made plan we use to minimize narcotic use.     Celebrex - Anti-inflammatory medication taken on a scheduled basis Acetaminophen - Non-narcotic pain medicine taken on a scheduled basis  Gabapentin - this is to help with nerve based pain, take on a scheduled basis Tramadol - This is a strong narcotic, to be used only on an "as needed" basis for SEVERE pain. Aspirin 53m - This medicine is used to minimize the risk of blood clots after surgery. Zofran - take as needed for nausea    FOLLOW-UP   Please call the office to schedule a follow-up appointment for your incision check, 7-10 days post-operatively.   IF YOU HAVE ANY QUESTIONS, PLEASE FEEL FREE TO CALL OUR OFFICE.   HELPFUL INFORMATION   Keep your leg elevated to decrease swelling, which will then in turn decrease your pain. I would elevate the foot of your bed by putting a couple of couch pillows between your mattress and box spring. I would not keep pillow directly under your ankle.  - Do not sleep with a pillow behind your knee even if it is more comfortable as this may make it harder to get your  knee fully straight long term.   There will be MORE swelling on days 1-3 than there is on the day of surgery.  This also is normal. The swelling will decrease with the anti-inflammatory medication, ice and keeping it elevated. The swelling will make it more difficult to bend your knee. As the swelling goes down your motion will become easier   You may develop swelling and bruising that extends from your knee down to your calf and perhaps even to your foot over the next week. Do not be alarmed. This too is normal, and it is due to gravity   There may be some numbness adjacent to the incision site. This may last for 6-12 months or longer in some patients and is expected.   You may return  to sedentary work/school in the next couple of days when you feel up to it. You will need to keep your leg elevated as much as possible    You should wean off your narcotic medicines as soon as you are able.  You should be off narcotics before your first postop appointment.    We suggest you use the pain medication the first night prior to going to bed, in order to ease any pain when the anesthesia wears off. You should avoid taking pain medications on an empty stomach as it will make you nauseous.   Do not drink alcoholic beverages or take illicit drugs when taking pain medications.   It is against the law to drive while taking narcotics. You cannot drive if your Right leg is in brace locked in extension.   Pain medication may make you constipated.  Below are a few solutions to try in this order:  o Decrease the amount of pain medication if you aren't having pain.  o Drink lots of decaffeinated fluids.  o Drink prune juice and/or eat dried prunes   o If the first 3 don't work start with additional solutions  o Take Colace - an over-the-counter stool softener  o Take Senokot - an over-the-counter laxative  o Take Miralax - a stronger over-the-counter laxative    For more information including helpful videos and documents visit our website:   [email or web address]    Post Anesthesia Home Care Instructions  Activity: Get plenty of rest for the remainder of the day. A responsible individual must stay with you for 24 hours following the procedure.  For the next 24 hours, DO NOT: -Drive a car -Paediatric nurse -Drink alcoholic beverages -Take any medication unless instructed by your physician -Make any legal decisions or sign important papers.  Meals: Start with liquid foods such as gelatin or soup. Progress to regular foods as tolerated. Avoid greasy, spicy, heavy foods. If nausea and/or vomiting occur, drink only clear liquids until the nausea  and/or vomiting subsides. Call your physician if vomiting continues.  Special Instructions/Symptoms: Your throat may feel dry or sore from the anesthesia or the breathing tube placed in your throat during surgery. If this causes discomfort, gargle with warm salt water. The discomfort should disappear within 24 hours.  If you had a scopolamine patch placed behind your ear for the management of post- operative nausea and/or vomiting:  1. The medication in the patch is effective for 72 hours, after which it should be removed.  Wrap patch in a tissue and discard in the trash. Wash hands thoroughly with soap and water. 2. You may remove the patch earlier than 72 hours if you experience unpleasant side effects which may  include dry mouth, dizziness or visual disturbances. 3. Avoid touching the patch. Wash your hands with soap and water after contact with the patch.

## 2022-06-01 NOTE — Therapy (Signed)
OUTPATIENT PHYSICAL THERAPY LOWER EXTREMITY EVALUATION   Patient Name: Anna Moses MRN: [id number] DOB:1975-05-26, 47 y.o., female Today's Date: 06/03/2022  END OF SESSION:  PT End of Session - 06/03/22 0933     Visit Number 1    Number of Visits 21    Date for PT Re-Evaluation 07/29/22    Authorization Type [place] [facility]    Authorization Time Period auth TBD    PT Start Time [id number]    PT Stop Time 1008    PT Time Calculation (min) 34 min    Activity Tolerance Patient tolerated treatment well;No increased pain    Behavior During Therapy WFL for tasks assessed/performed             Past Medical History:  Diagnosis Date   Asthma    Bronchitis    Depression    Obesity    Schizophrenia ([place])    Past Surgical History:  Procedure Laterality Date   CESAREAN SECTION     CHONDROPLASTY Left 06/02/2022   Procedure: CHONDROPLASTY;  Surgeon: Hiram Gash, MD;  Location: [place];  Service: Orthopedics;  Laterality: Left;   COLONOSCOPY  06/27/2019   KNEE ARTHROSCOPY WITH LATERAL RELEASE Left 06/02/2022   Procedure: KNEE ARTHROSCOPY WITH LATERAL RELEASE;  Surgeon: Hiram Gash, MD;  Location: [place];  Service: Orthopedics;  Laterality: Left;   KNEE ARTHROSCOPY WITH MEDIAL MENISECTOMY Left 06/02/2022   Procedure: KNEE ARTHROSCOPY WITH MEDIAL MENISECTOMY;  Surgeon: Hiram Gash, MD;  Location: [facility];  Service: Orthopedics;  Laterality: Left;   UPPER GASTROINTESTINAL ENDOSCOPY  06/27/2019   Patient Active Problem List   Diagnosis Date Noted   Foreign body in left ear 10/08/2021   Hematuria 09/09/2021   Breast pain in female 08/29/2021   Muscle strain 08/13/2021   Lower back pain 07/14/2021   Contraception management 11/17/2020   Depression, recurrent ([place]) 11/17/2020   Effusion of left knee 08/15/2020   Cervical cancer screening 09/18/2019   Routine screening for STI (sexually transmitted infection) [id number]   Cyclical  vomiting [id number]   Schizophrenia ([place]) 07/24/2019   H. pylori infection 07/24/2019   Seasonal allergies 07/24/2019   Dysphagia 06/03/2019   Nausea and vomiting 06/03/2019   Liver lesion 06/03/2019   Preop examination 06/03/2019    PCP: Holley Bouche, MD  REFERRING PROVIDER: Hiram Gash, MD  REFERRING DIAG: Left knee arthroscopy with lateral release and patellar chondroplasty Per referral can begin therapy 1-3 days after surgery  THERAPY DIAG:  Left knee pain, unspecified chronicity  Localized edema  Other abnormalities of gait and mobility  Rationale for Evaluation and Treatment: Rehabilitation  ONSET DATE: 06/02/22  SUBJECTIVE:   SUBJECTIVE STATEMENT: Pt states prior to surgery she was having significant pain in knee, was receiving injections with some relief, elected for surgical intervention. Eval performed on day after surgery. Pt arrives into clinic without brace or AD, antalgic gait as below. Full time support at home but states so far she has been able to perform self-care and dressing ADLs without assist, requires increased time and effort. Does not endorse any red flags  PERTINENT HISTORY: hx schizophrenia/depression, s/p left knee arthroscopy with lateral release and patellar chondroplasty PAIN:  Are you having pain: yes, 6/10 L knee Location/description: L knee Best-worst over past week: 6-10/10  Per eval -  - aggravating factors: WB, bending, walking/standing - Easing factors: medication    PRECAUTIONS: Left knee arthroscopy with partial medial meniscectomy Left knee lateral  release (Above per op note) Performed on 06/02/22  WEIGHT BEARING RESTRICTIONS: No (WBAT per op note)  FALLS:  Has patient fallen in last 6 months? No  LIVING ENVIRONMENT: W/ mother and boyfriend, has to navigate 17-18 steps to bedroom with rail, has full time assist as needed  OCCUPATION: not working   PLOF: Independent  PATIENT GOALS: be able to play with grandkids  (2-8 y.o)   NEXT MD VISIT: 1 week  OBJECTIVE:   DIAGNOSTIC FINDINGS: post op 06/02/22  PATIENT SURVEYS:  LEFS: 37/80  COGNITION: Overall cognitive status: Within functional limits for tasks assessed     SENSATION: Denies any sensory complaints, unable to assess d/t dressing/wrap  EDEMA/INSPECTION:  Apparent edema about knee joint grossly WNL although formal assessment deferred due to dressing/wrap from knee joint to ankle, pt states she was instructed to leave on for at least three days   POSTURE: rounded shoulders, forward head, and increased thoracic kyphosis  PALPATION: Mild tightness as expected L quad  LOWER EXTREMITY ROM:  Active ROM Right eval Left eval  Hip flexion    Hip extension    Hip abduction    Hip adduction    Hip internal rotation    Hip external rotation    Knee flexion 130 deg 50 deg  Knee extension 0 ~0 (apparent, confounded by thick wrapping/dressing)  Ankle dorsiflexion    Ankle plantarflexion    Ankle inversion    Ankle eversion     (Blank rows = not tested)  LOWER EXTREMITY MMT:    MMT Right eval Left eval  Hip flexion 5   Hip abduction (modified sitting) 5   Hip internal rotation    Hip external rotation    Knee flexion 5   Knee extension 5    (Blank rows = not tested) (Key: WFL = within functional limits not formally assessed, * = concordant pain, s = stiffness/stretching sensation, NT = not tested)  Comments:  surgical limb deferred given proximity to surgery    FUNCTIONAL TESTS:  Sit to stand transfer: B UE support, weight shift to R with anterior placement LLE, inc time and effort   GAIT: Distance walked: within clinic Assistive device utilized: None Level of assistance: Complete Independence Comments: significant antalgic gait, reduced stance on L, trunk lean to R, reduced trunk rotation/arm swing. No LOB but appears mildly unsteady.    TODAY'S TREATMENT:                                                                                                                               [facility] PT Treatment:                                                DATE: 06/03/22 Therapeutic Exercise: Quad set x10 RLE cues for form, positioning, and  appropriate performance Knee flexion AAROM x10 RLE cues for positioning, appropriate ROM, and HEP HEP handout, education and review    PATIENT EDUCATION:  Education details: Pt education on PT impairments, prognosis, and POC. Informed consent. Rationale for interventions, safe/appropriate HEP performance. Monitoring symptoms, icing as appropriate, surgical healing, AD use for safety Person educated: Patient Education method: Explanation, Demonstration, Tactile cues, Verbal cues, and Handouts Education comprehension: verbalized understanding, returned demonstration, verbal cues required, tactile cues required, and needs further education    HOME EXERCISE PROGRAM: Access Code: [email or web address] URL: [email or web address] Date: 06/03/2022 Prepared by: Enis Slipper  Exercises - Supine Quad Set  - 1 x daily - 7 x weekly - 3 sets - 10 reps - Seated Knee Flexion AAROM  - 1 x daily - 7 x weekly - 3 sets - 10 reps  ASSESSMENT:  CLINICAL IMPRESSION: Patient is a pleasant 47 y.o. woman who was seen today for physical therapy evaluation and treatment for L knee pain s/p arthroscopic partial medial meniscectomy with lateral release and patellar chondroplasty on 06/02/22. Per referral, can begin therapy 1-3 days after surgery. Pt arrives w/ 6/10 pain, ROM deficits as expected POD1. Deferred MMT given proximity to surgery, gait impairments as above. No apparent indication of complications at this point, educated pt on monitoring for adverse symptoms/events, she verbalizes understanding. Also encouraged use of axillary crutches for now due to gait impairments as above and mild unsteadiness, pt verbalizes agreement. HEP tolerated well as above, handout provided. No adverse events,  denies any increase in pain on departure. Recommend skilled PT to address post surgical deficits and maximize functional tolerance. Pt departs today's session in no acute distress, all voiced questions/concerns addressed appropriately from PT perspective.    OBJECTIVE IMPAIRMENTS: Abnormal gait, decreased activity tolerance, decreased balance, decreased endurance, decreased mobility, difficulty walking, decreased ROM, decreased strength, increased edema, impaired perceived functional ability, improper body mechanics, and pain.   ACTIVITY LIMITATIONS: carrying, lifting, bending, standing, squatting, stairs, transfers, bed mobility, and locomotion level  PARTICIPATION LIMITATIONS: meal prep, cleaning, laundry, and community activity  PERSONAL FACTORS: Time since onset of injury/illness/exacerbation and 1 comorbidity: depression  are also affecting patient's functional outcome.   REHAB POTENTIAL: Good  CLINICAL DECISION MAKING: Evolving/moderate complexity  EVALUATION COMPLEXITY: Low   GOALS: Goals reviewed with patient? No  SHORT TERM GOALS: Target date: 07/08/2022 Pt will demonstrate appropriate understanding and performance of initially prescribed HEP in order to facilitate improved independence with management of symptoms.  Baseline: HEP provided on eval Goal status: INITIAL   2. Pt will score greater than or equal to 48 on LEFS in order to demonstrate improved perception of function due to symptoms.  Baseline: 37  Goal status: INITIAL    LONG TERM GOALS: Target date: 08/12/2022   Pt will score 55 or greater on LEFS in order to demonstrate improved perception of function due to symptoms.  Baseline: 37/80 Goal status: INITIAL  2.  Pt will demonstrate at least 0-110 degrees of L knee AROM in order to facilitate improved tolerance to functional movements such as bending/squatting.  Baseline: see ROM chart above Goal status: INITIAL  3.  Pt will be able to lift up to 25# with less  than 2 pt increase in resting pain on NPS in order to demonstrate improved capacity for daily activities such as housework and interacting with grandchildren.  Baseline: NT due to proximity to surgery Goal status: INITIAL  4.  Pt will be able to perform sit to stand transfer without UE  support and grossly appropriate mechanics in order to maximize safety w/ transfers. Baseline: see above - altered mechanics and requires B UE support Goal status: INITIAL   5. Pt will be able to safely navigate up to 18 stairs with unilat UE support in order to maximize safety w/ home navigation and reduce fall risk  Baseline: inc time/effort and difficulty reported with stair navigation at home  Goal status: INITIAL    PLAN:  PT FREQUENCY: 2x/week  PT DURATION: 10 weeks  PLANNED INTERVENTIONS: Therapeutic exercises, Therapeutic activity, Neuromuscular re-education, Balance training, Gait training, Patient/Family education, Self Care, Joint mobilization, Stair training, DME instructions, Aquatic Therapy, Dry Needling, Electrical stimulation, Cryotherapy, Moist heat, scar mobilization, Taping, Manual therapy, and Re-evaluation  PLAN FOR NEXT SESSION: Emphasis on quad activation and knee flexion. Review/update HEP PRN   Leeroy Cha PT, DPT 06/03/2022 2:11 PM

## 2022-06-02 ENCOUNTER — Ambulatory Visit (HOSPITAL_BASED_OUTPATIENT_CLINIC_OR_DEPARTMENT_OTHER)
Admission: RE | Admit: 2022-06-02 | Discharge: 2022-06-02 | Disposition: A | Discharge status: Home / Self Care | Payer: Medicaid Other | Attending: Orthopaedic Surgery | Admitting: Orthopaedic Surgery

## 2022-06-02 ENCOUNTER — Encounter (HOSPITAL_BASED_OUTPATIENT_CLINIC_OR_DEPARTMENT_OTHER): Payer: Self-pay | Admitting: Orthopaedic Surgery

## 2022-06-02 ENCOUNTER — Encounter (HOSPITAL_BASED_OUTPATIENT_CLINIC_OR_DEPARTMENT_OTHER)
Admission: RE | Disposition: A | Discharge status: Home / Self Care | Payer: Self-pay | Source: Home / Self Care | Attending: Orthopaedic Surgery

## 2022-06-02 ENCOUNTER — Ambulatory Visit (HOSPITAL_BASED_OUTPATIENT_CLINIC_OR_DEPARTMENT_OTHER): Payer: Medicaid Other | Admitting: Certified Registered"

## 2022-06-02 ENCOUNTER — Other Ambulatory Visit: Payer: Self-pay

## 2022-06-02 DIAGNOSIS — S83232A Complex tear of medial meniscus, current injury, left knee, initial encounter: Secondary | ICD-10-CM | POA: Diagnosis present

## 2022-06-02 DIAGNOSIS — X58XXXA Exposure to other specified factors, initial encounter: Secondary | ICD-10-CM | POA: Diagnosis not present

## 2022-06-02 DIAGNOSIS — F17210 Nicotine dependence, cigarettes, uncomplicated: Secondary | ICD-10-CM | POA: Insufficient documentation

## 2022-06-02 DIAGNOSIS — Z6841 Body Mass Index (BMI) 40.0 and over, adult: Secondary | ICD-10-CM | POA: Insufficient documentation

## 2022-06-02 DIAGNOSIS — J45909 Unspecified asthma, uncomplicated: Secondary | ICD-10-CM | POA: Diagnosis not present

## 2022-06-02 DIAGNOSIS — M2392 Unspecified internal derangement of left knee: Secondary | ICD-10-CM | POA: Diagnosis not present

## 2022-06-02 DIAGNOSIS — Z01818 Encounter for other preprocedural examination: Secondary | ICD-10-CM

## 2022-06-02 HISTORY — PX: KNEE ARTHROSCOPY WITH LATERAL RELEASE: SHX5649

## 2022-06-02 HISTORY — PX: KNEE ARTHROSCOPY WITH MEDIAL MENISECTOMY: SHX5651

## 2022-06-02 HISTORY — PX: CHONDROPLASTY: SHX5177

## 2022-06-02 LAB — POCT PREGNANCY, URINE: Preg Test, Ur: NEGATIVE

## 2022-06-02 SURGERY — ARTHROSCOPY, KNEE, WITH MEDIAL MENISCECTOMY
Anesthesia: General | Site: Knee | Laterality: Left

## 2022-06-02 MED ORDER — BUPIVACAINE HCL (PF) 0.25 % IJ SOLN
INTRAMUSCULAR | Status: AC
  Filled 2022-06-02: qty 30

## 2022-06-02 MED ORDER — FENTANYL CITRATE (PF) 100 MCG/2ML IJ SOLN
INTRAMUSCULAR | Status: DC | PRN
  Administered 2022-06-02: 100 ug via INTRAVENOUS
  Administered 2022-06-02: 50 ug via INTRAVENOUS

## 2022-06-02 MED ORDER — ONDANSETRON HCL 4 MG/2ML IJ SOLN: 4.0000 mg | Freq: Once | INTRAMUSCULAR | Status: DC | PRN

## 2022-06-02 MED ORDER — LACTATED RINGERS IV SOLN: INTRAVENOUS | Status: DC

## 2022-06-02 MED ORDER — ONDANSETRON HCL 4 MG/2ML IJ SOLN
INTRAMUSCULAR | Status: DC | PRN
  Administered 2022-06-02: 4 mg via INTRAVENOUS

## 2022-06-02 MED ORDER — ASPIRIN 81 MG PO CHEW: 81.0000 mg | CHEWABLE_TABLET | Freq: Two times a day (BID) | ORAL | 0 refills | Status: AC

## 2022-06-02 MED ORDER — MEPERIDINE HCL 25 MG/ML IJ SOLN: 6.2500 mg | INTRAMUSCULAR | Status: DC | PRN

## 2022-06-02 MED ORDER — TRAMADOL HCL 50 MG PO TABS: 50.0000 mg | ORAL_TABLET | Freq: Four times a day (QID) | ORAL | 0 refills | Status: AC | PRN

## 2022-06-02 MED ORDER — MIDAZOLAM HCL 5 MG/5ML IJ SOLN
INTRAMUSCULAR | Status: DC | PRN
  Administered 2022-06-02: 2 mg via INTRAVENOUS

## 2022-06-02 MED ORDER — FENTANYL CITRATE (PF) 100 MCG/2ML IJ SOLN
INTRAMUSCULAR | Status: AC
  Filled 2022-06-02: qty 2

## 2022-06-02 MED ORDER — KETOROLAC TROMETHAMINE 30 MG/ML IJ SOLN
30.0000 mg | Freq: Once | INTRAMUSCULAR | Status: AC | PRN
  Administered 2022-06-02: 30 mg via INTRAVENOUS

## 2022-06-02 MED ORDER — ONDANSETRON HCL 4 MG PO TABS: 4.0000 mg | ORAL_TABLET | Freq: Three times a day (TID) | ORAL | 0 refills | Status: AC | PRN

## 2022-06-02 MED ORDER — CELECOXIB 100 MG PO CAPS: 100.0000 mg | ORAL_CAPSULE | Freq: Two times a day (BID) | ORAL | 0 refills | Status: DC

## 2022-06-02 MED ORDER — PROPOFOL 10 MG/ML IV BOLUS
INTRAVENOUS | Status: DC | PRN
  Administered 2022-06-02: 200 mg via INTRAVENOUS

## 2022-06-02 MED ORDER — CEFAZOLIN SODIUM-DEXTROSE 2-4 GM/100ML-% IV SOLN
INTRAVENOUS | Status: AC
  Filled 2022-06-02: qty 100

## 2022-06-02 MED ORDER — LIDOCAINE HCL (CARDIAC) PF 100 MG/5ML IV SOSY
PREFILLED_SYRINGE | INTRAVENOUS | Status: DC | PRN
  Administered 2022-06-02: 100 mg via INTRAVENOUS

## 2022-06-02 MED ORDER — GABAPENTIN 100 MG PO CAPS: 100.0000 mg | ORAL_CAPSULE | Freq: Three times a day (TID) | ORAL | 0 refills | Status: AC

## 2022-06-02 MED ORDER — GABAPENTIN 300 MG PO CAPS
300.0000 mg | ORAL_CAPSULE | Freq: Once | ORAL | Status: AC
  Administered 2022-06-02: 300 mg via ORAL

## 2022-06-02 MED ORDER — ACETAMINOPHEN 500 MG PO TABS: 1000.0000 mg | ORAL_TABLET | Freq: Three times a day (TID) | ORAL | 0 refills | Status: AC

## 2022-06-02 MED ORDER — PROPOFOL 500 MG/50ML IV EMUL
INTRAVENOUS | Status: AC
  Filled 2022-06-02: qty 50

## 2022-06-02 MED ORDER — ACETAMINOPHEN 500 MG PO TABS
ORAL_TABLET | ORAL | Status: AC
  Filled 2022-06-02: qty 2

## 2022-06-02 MED ORDER — DEXMEDETOMIDINE HCL IN NACL 80 MCG/20ML IV SOLN
INTRAVENOUS | Status: DC | PRN
  Administered 2022-06-02: 8 ug via BUCCAL

## 2022-06-02 MED ORDER — HYDROMORPHONE HCL 1 MG/ML IJ SOLN
INTRAMUSCULAR | Status: AC
  Filled 2022-06-02: qty 0.5

## 2022-06-02 MED ORDER — SODIUM CHLORIDE 0.9 % IR SOLN
Status: DC | PRN
  Administered 2022-06-02: 3000 mL

## 2022-06-02 MED ORDER — HYDROMORPHONE HCL 1 MG/ML IJ SOLN
0.2500 mg | INTRAMUSCULAR | Status: DC | PRN
  Administered 2022-06-02: 0.5 mg via INTRAVENOUS

## 2022-06-02 MED ORDER — ACETAMINOPHEN 500 MG PO TABS: 1000.0000 mg | ORAL_TABLET | Freq: Once | ORAL | Status: DC

## 2022-06-02 MED ORDER — MIDAZOLAM HCL 2 MG/2ML IJ SOLN
INTRAMUSCULAR | Status: AC
  Filled 2022-06-02: qty 2

## 2022-06-02 MED ORDER — BUPIVACAINE HCL (PF) 0.25 % IJ SOLN
INTRAMUSCULAR | Status: DC | PRN
  Administered 2022-06-02: 20 mL

## 2022-06-02 MED ORDER — CEFAZOLIN SODIUM-DEXTROSE 2-4 GM/100ML-% IV SOLN: 2.0000 g | INTRAVENOUS | Status: DC

## 2022-06-02 MED ORDER — DEXAMETHASONE SODIUM PHOSPHATE 4 MG/ML IJ SOLN
INTRAMUSCULAR | Status: DC | PRN
  Administered 2022-06-02: 10 mg via INTRAVENOUS

## 2022-06-02 MED ORDER — LIDOCAINE 2% (20 MG/ML) 5 ML SYRINGE
INTRAMUSCULAR | Status: AC
  Filled 2022-06-02: qty 5

## 2022-06-02 MED ORDER — GABAPENTIN 300 MG PO CAPS
ORAL_CAPSULE | ORAL | Status: AC
  Filled 2022-06-02: qty 1

## 2022-06-02 MED ORDER — DEXAMETHASONE SODIUM PHOSPHATE 10 MG/ML IJ SOLN
INTRAMUSCULAR | Status: AC
  Filled 2022-06-02: qty 1

## 2022-06-02 MED ORDER — KETOROLAC TROMETHAMINE 30 MG/ML IJ SOLN
INTRAMUSCULAR | Status: AC
  Filled 2022-06-02: qty 1

## 2022-06-02 MED ORDER — ONDANSETRON HCL 4 MG/2ML IJ SOLN
INTRAMUSCULAR | Status: AC
  Filled 2022-06-02: qty 2

## 2022-06-02 SURGICAL SUPPLY — 33 items
APL PRP STRL LF DISP 70% ISPRP (MISCELLANEOUS) ×2
BANDAGE ESMARK 6X9 LF (GAUZE/BANDAGES/DRESSINGS) IMPLANT
BNDG CMPR 6"X 5 YARDS HK CLSR (GAUZE/BANDAGES/DRESSINGS) ×2
BNDG CMPR 9X6 STRL LF SNTH (GAUZE/BANDAGES/DRESSINGS)
BNDG ELASTIC 6INX 5YD STR LF (GAUZE/BANDAGES/DRESSINGS) ×2 IMPLANT
BNDG ESMARK 6X9 LF (GAUZE/BANDAGES/DRESSINGS)
CHLORAPREP W/TINT 26 (MISCELLANEOUS) ×2 IMPLANT
CLSR STERI-STRIP ANTIMIC 1/2X4 (GAUZE/BANDAGES/DRESSINGS) ×2 IMPLANT
CUFF TOURN SGL QUICK 34 (TOURNIQUET CUFF) ×2
CUFF TRNQT CYL 34X4.125X (TOURNIQUET CUFF) ×2 IMPLANT
DISSECTOR 4.0MMX13CM CVD (MISCELLANEOUS) ×2 IMPLANT
DRAPE ARTHROSCOPY W/POUCH 90 (DRAPES) ×2 IMPLANT
DRAPE IMP U-DRAPE 54X76 (DRAPES) ×2 IMPLANT
DRAPE U-SHAPE 47X51 STRL (DRAPES) ×2 IMPLANT
GAUZE SPONGE 4X4 12PLY STRL (GAUZE/BANDAGES/DRESSINGS) ×2 IMPLANT
GLOVE BIO SURGEON STRL SZ 6.5 (GLOVE) ×2 IMPLANT
GLOVE BIOGEL PI IND STRL 6.5 (GLOVE) ×2 IMPLANT
GLOVE BIOGEL PI IND STRL 8 (GLOVE) ×2 IMPLANT
GLOVE ECLIPSE 8.0 STRL XLNG CF (GLOVE) ×4 IMPLANT
GOWN STRL REUS W/ TWL LRG LVL3 (GOWN DISPOSABLE) ×2 IMPLANT
GOWN STRL REUS W/TWL LRG LVL3 (GOWN DISPOSABLE) ×2
GOWN STRL REUS W/TWL XL LVL3 (GOWN DISPOSABLE) ×2 IMPLANT
KIT TURNOVER KIT B (KITS) ×2 IMPLANT
MANIFOLD NEPTUNE II (INSTRUMENTS) IMPLANT
NS IRRIG 1000ML POUR BTL (IV SOLUTION) IMPLANT
PACK ARTHROSCOPY DSU (CUSTOM PROCEDURE TRAY) ×2 IMPLANT
PORT APPOLLO RF 90DEGREE MULTI (SURGICAL WAND) IMPLANT
SLEEVE SCD COMPRESS KNEE MED (STOCKING) ×2 IMPLANT
SUT MNCRL AB 4-0 PS2 18 (SUTURE) ×2 IMPLANT
TOWEL GREEN STERILE FF (TOWEL DISPOSABLE) ×2 IMPLANT
TUBE CONNECTING 20X1/4 (TUBING) ×2 IMPLANT
TUBING ARTHROSCOPY IRRIG 16FT (MISCELLANEOUS) ×2 IMPLANT
WATER STERILE IRR 1000ML POUR (IV SOLUTION) ×2 IMPLANT

## 2022-06-02 NOTE — Interval H&P Note (Signed)
All questions answered, patient wants to proceed with procedure. ? ?

## 2022-06-02 NOTE — Anesthesia Postprocedure Evaluation (Signed)
Anesthesia Post Note  Patient: Anna Moses  Procedure(s) Performed: KNEE ARTHROSCOPY WITH MEDIAL MENISECTOMY (Left: Knee) KNEE ARTHROSCOPY WITH LATERAL RELEASE (Left: Knee) CHONDROPLASTY (Left)     Patient location during evaluation: Phase II Anesthesia Type: General Level of consciousness: awake Pain management: pain level controlled Vital Signs Assessment: post-procedure vital signs reviewed and stable Respiratory status: spontaneous breathing Cardiovascular status: stable Postop Assessment: no apparent nausea or vomiting Anesthetic complications: no  No notable events documented.  Last Vitals:  Vitals:   06/02/22 0830 06/02/22 0845  BP: 108/78 135/86  Pulse: 73 77  Resp: 13 19  Temp:    SpO2: 100% 97%    Last Pain:  Vitals:   06/02/22 0845  TempSrc:   PainSc: [place]

## 2022-06-02 NOTE — Anesthesia Preprocedure Evaluation (Signed)
Anesthesia Evaluation  Patient identified by MRN, date of birth, ID band Patient awake    Reviewed: Allergy & Precautions, NPO status , Patient's Chart, lab work & pertinent test results  Airway Mallampati: II       Dental no notable dental hx.    Pulmonary asthma , Current Smoker   Pulmonary exam normal        Cardiovascular negative cardio ROS Normal cardiovascular exam     Neuro/Psych    GI/Hepatic negative GI ROS, Neg liver ROS,,,  Endo/Other    Morbid obesity  Renal/GU negative Renal ROS     Musculoskeletal   Abdominal  (+) + obese  Peds  Hematology   Anesthesia Other Findings   Reproductive/Obstetrics negative OB ROS                             Anesthesia Physical Anesthesia Plan  ASA: 3  Anesthesia Plan: General   Post-op Pain Management:    Induction: Intravenous  PONV Risk Score and Plan: 3 and Ondansetron, Dexamethasone and Midazolam  Airway Management Planned: LMA  Additional Equipment: None  Intra-op Plan:   Post-operative Plan: Extubation in OR  Informed Consent: I have reviewed the patients History and Physical, chart, labs and discussed the procedure including the risks, benefits and alternatives for the proposed anesthesia with the patient or authorized representative who has indicated his/her understanding and acceptance.     Dental advisory given  Plan Discussed with: CRNA  Anesthesia Plan Comments:        Anesthesia Quick Evaluation

## 2022-06-02 NOTE — Op Note (Signed)
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  There are corresponding areas on the lateral trochlea of scattered grade 3 cartilage loss. Medial Compartment: Complex posterior medial meniscus tear, 20% total meniscal volume resected Lateral Compartment: Central area of a 8 x 8 grade 3 cartilage loss Intercondylar Notch: Normal  Successful completion of the planned procedure.  The joint was more preserved than I expected based on the MRI however she did have some areas of near full-thickness cartilage loss.  There were multiple loose bodies within the joint however they were relatively small and removed with a shaver.  Lateral release was needed to correct patellar tilt.  Post-operative plan: The patient will be weightbearing to tolerance.  The patient will be charged home.  DVT prophylaxis Aspirin 81 mg twice daily for 6 weeks.  Pain control with PRN pain medication preferring oral medicines.  Follow up plan will be scheduled in approximately 7 days for incision check.  Post-Op Diagnosis: Same Surgeons:Primary: Hiram Gash, MD Assistants:Caroline McBane PA-C Location: [facility] OR ROOM 1 Anesthesia: General with local Antibiotics: Ancef 2 g Tourniquet time:  Total Tourniquet Time Documented: Thigh (Left) - 9 minutes Total: Thigh (Left) - 9 minutes  Estimated Blood Loss: Minimal Complications: None Specimens:  None Implants: * No implants in log *  Indications for Surgery:   Anna Moses is a 47 y.o. 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  Benefits and risks of operative and nonoperative management were discussed prior to surgery with patient/guardian(s) and informed consent form was completed.  Specific risks including infection, need for additional surgery, continued pain, need for arthroplasty amongst others   Procedure:   The patient was identified properly. Informed consent was obtained and the surgical site was marked. The patient was taken up to suite where general anesthesia was induced. The patient was placed in the supine position with a post against the surgical leg and a nonsterile tourniquet applied. The surgical leg was then prepped and draped usual sterile fashion.  A standard surgical timeout was performed.  2 standard anterior portals were made and diagnostic arthroscopy performed. Please note the findings as noted above.  A shaver and a basket debride back the medial meniscus to a stable base.  There are multiple cartilaginous loose bodies within the joint and these were removed with a shaver.  There were areas of cartilage injury that were debrided back to a stable base.  We then performed a lateral release.  We used a hemostat to separate the retinaculum from the skin.  We then under arthroscopic visualization released the retinaculum with an RF ablator.  We then released the tourniquet to ensure that they are not excessive bleeding.  There was a small bleeder however this was coagulated  Incisions closed with absorbable suture. The patient was awoken from general anesthesia and taken to the PACU in stable condition without complication.   Noemi Chapel, PA-C, present and scrubbed throughout the case, critical  for completion in a timely fashion, and for retraction, instrumentation, closure.

## 2022-06-02 NOTE — Anesthesia Procedure Notes (Signed)
Procedure Name: LMA Insertion Date/Time: 06/02/2022 8:09 AM  Performed by: Tawni Millers, CRNAPre-anesthesia Checklist: Patient identified, Emergency Drugs available, Suction available and Patient being monitored Patient Re-evaluated:Patient Re-evaluated prior to induction Oxygen Delivery Method: Circle system utilized Preoxygenation: Pre-oxygenation with 100% oxygen Induction Type: IV induction Ventilation: Mask ventilation without difficulty LMA: LMA inserted LMA Size: 4.0 Number of attempts: 1 Airway Equipment and Method: Bite block Placement Confirmation: positive ETCO2 Tube secured with: Tape Dental Injury: Teeth and Oropharynx as per pre-operative assessment

## 2022-06-02 NOTE — Transfer of Care (Signed)
Immediate Anesthesia Transfer of Care Note  Patient: Harman Mox  Procedure(s) Performed: KNEE ARTHROSCOPY WITH MEDIAL MENISECTOMY (Left: Knee) KNEE ARTHROSCOPY WITH LATERAL RELEASE (Left: Knee) CHONDROPLASTY (Left)  Patient Location: PACU  Anesthesia Type:General  Level of Consciousness: sedated  Airway & Oxygen Therapy: Patient Spontanous Breathing and Patient connected to face mask oxygen  Post-op Assessment: Report given to RN and Post -op Vital signs reviewed and stable  Post vital signs: Reviewed and stable  Last Vitals:  Vitals Value Taken Time  BP    Temp    Pulse    Resp    SpO2      Last Pain:  Vitals:   06/02/22 0627  TempSrc: Oral  PainSc: 3       Patients Stated Pain Goal: 3 ([id number])  Complications: No notable events documented.

## 2022-06-03 ENCOUNTER — Other Ambulatory Visit: Payer: Self-pay

## 2022-06-03 ENCOUNTER — Encounter (HOSPITAL_BASED_OUTPATIENT_CLINIC_OR_DEPARTMENT_OTHER): Payer: Self-pay | Admitting: Orthopaedic Surgery

## 2022-06-03 ENCOUNTER — Ambulatory Visit: Payer: Medicaid Other | Attending: Child and Adolescent Psychiatry | Admitting: Physical Therapy

## 2022-06-03 DIAGNOSIS — M25562 Pain in left knee: Secondary | ICD-10-CM | POA: Insufficient documentation

## 2022-06-03 DIAGNOSIS — R2689 Other abnormalities of gait and mobility: Secondary | ICD-10-CM | POA: Insufficient documentation

## 2022-06-03 DIAGNOSIS — R600 Localized edema: Secondary | ICD-10-CM | POA: Insufficient documentation

## 2022-06-06 ENCOUNTER — Ambulatory Visit (INDEPENDENT_AMBULATORY_CARE_PROVIDER_SITE_OTHER): Payer: Medicaid Other

## 2022-06-06 ENCOUNTER — Ambulatory Visit: Payer: Medicaid Other | Admitting: Physical Therapy

## 2022-06-06 ENCOUNTER — Telehealth: Payer: Self-pay | Admitting: Physical Therapy

## 2022-06-06 DIAGNOSIS — Z308 Encounter for other contraceptive management: Secondary | ICD-10-CM

## 2022-06-06 MED ORDER — MEDROXYPROGESTERONE ACETATE 150 MG/ML IM SUSY
150.0000 mg | PREFILLED_SYRINGE | Freq: Once | INTRAMUSCULAR | Status: AC
  Administered 2022-06-06: 150 mg via INTRAMUSCULAR

## 2022-06-06 NOTE — Progress Notes (Signed)
Patient here today for Depo Provera injection and is within her dates.    Last contraceptive appt was 03/15/2022.  Depo given in RD today. Site unremarkable & patient tolerated injection.    Next injection due 08/23/2022-09/06/2022.  Reminder card given.

## 2022-06-06 NOTE — Telephone Encounter (Signed)
Spoke with pt regarding missed appointment today. She referenced that she forgot she had an appointment. I reviewed her next scheduled appointment which she referenced she planned to attend. I referenced the attendance policy and if she cannot make it to  her appointment to call preferably 24 hours in advanced. She verbalized understanding.  Javionna Leder PT, DPT, LAT, ATC  06/06/22  10:15 AM

## 2022-06-06 NOTE — Therapy (Incomplete)
OUTPATIENT PHYSICAL THERAPY TREATMENT NOTE   Patient Name: Anna Moses MRN: [id number] DOB:08-14-1975, 47 y.o., female Today's Date: 06/06/2022  PCP: Holley Bouche, MD  REFERRING PROVIDER: Hiram Gash, MD   END OF SESSION:    Past Medical History:  Diagnosis Date   Asthma    Bronchitis    Depression    Obesity    Schizophrenia [facility])    Past Surgical History:  Procedure Laterality Date   CESAREAN SECTION     CHONDROPLASTY Left 06/02/2022   Procedure: CHONDROPLASTY;  Surgeon: Hiram Gash, MD;  Location: [facility];  Service: Orthopedics;  Laterality: Left;   COLONOSCOPY  06/27/2019   KNEE ARTHROSCOPY WITH LATERAL RELEASE Left 06/02/2022   Procedure: KNEE ARTHROSCOPY WITH LATERAL RELEASE;  Surgeon: Hiram Gash, MD;  Location: [place];  Service: Orthopedics;  Laterality: Left;   KNEE ARTHROSCOPY WITH MEDIAL MENISECTOMY Left 06/02/2022   Procedure: KNEE ARTHROSCOPY WITH MEDIAL MENISECTOMY;  Surgeon: Hiram Gash, MD;  Location: [place];  Service: Orthopedics;  Laterality: Left;   UPPER GASTROINTESTINAL ENDOSCOPY  06/27/2019   Patient Active Problem List   Diagnosis Date Noted   Foreign body in left ear 10/08/2021   Hematuria 09/09/2021   Breast pain in female 08/29/2021   Muscle strain 08/13/2021   Lower back pain 07/14/2021   Contraception management 11/17/2020   Depression, recurrent ([place]) 11/17/2020   Effusion of left knee 08/15/2020   Cervical cancer screening 09/18/2019   Routine screening for STI (sexually transmitted infection) [id number]   Cyclical vomiting [id number]   Schizophrenia ([place]) 07/24/2019   H. pylori infection 07/24/2019   Seasonal allergies 07/24/2019   Dysphagia 06/03/2019   Nausea and vomiting 06/03/2019   Liver lesion 06/03/2019   Preop examination 06/03/2019    REFERRING DIAG: Left knee arthroscopy with lateral release and patellar chondroplasty Per referral can begin therapy  1-3 days after surgery  THERAPY DIAG:  No diagnosis found.  Rationale for Evaluation and Treatment Rehabilitation  PERTINENT HISTORY: hx schizophrenia/depression, s/p left knee arthroscopy with lateral release and patellar chondroplasty   PRECAUTIONS: Left knee arthroscopy with partial medial meniscectomy Left knee lateral release (Above per op note) Performed on 06/02/22  SUBJECTIVE:                                                                                                                                                                                      SUBJECTIVE STATEMENT:  ***   PAIN:  Are you having pain? Yes: NPRS scale: ***/10 Pain location: *** Pain description: *** Aggravating factors: *** Relieving factors: ***   OBJECTIVE: (objective measures completed at  initial evaluation unless otherwise dated)  DIAGNOSTIC FINDINGS: post op 06/02/22   PATIENT SURVEYS:  LEFS: 37/80   COGNITION: Overall cognitive status: Within functional limits for tasks assessed                         SENSATION: Denies any sensory complaints, unable to assess d/t dressing/wrap   EDEMA/INSPECTION:  Apparent edema about knee joint grossly WNL although formal assessment deferred due to dressing/wrap from knee joint to ankle, pt states she was instructed to leave on for at least three days    POSTURE: rounded shoulders, forward head, and increased thoracic kyphosis   PALPATION: Mild tightness as expected L quad   LOWER EXTREMITY ROM:   Active ROM Right eval Left eval  Hip flexion      Hip extension      Hip abduction      Hip adduction      Hip internal rotation      Hip external rotation      Knee flexion 130 deg 50 deg  Knee extension 0 ~0 (apparent, confounded by thick wrapping/dressing)  Ankle dorsiflexion      Ankle plantarflexion      Ankle inversion      Ankle eversion       (Blank rows = not tested)   LOWER EXTREMITY MMT:     MMT Right eval Left eval  Hip  flexion 5    Hip abduction (modified sitting) 5    Hip internal rotation      Hip external rotation      Knee flexion 5    Knee extension 5      (Blank rows = not tested) (Key: WFL = within functional limits not formally assessed, * = concordant pain, s = stiffness/stretching sensation, NT = not tested)  Comments:  surgical limb deferred given proximity to surgery       FUNCTIONAL TESTS:  Sit to stand transfer: B UE support, weight shift to R with anterior placement LLE, inc time and effort    GAIT: Distance walked: within clinic Assistive device utilized: None Level of assistance: Complete Independence Comments: significant antalgic gait, reduced stance on L, trunk lean to R, reduced trunk rotation/arm swing. No LOB but appears mildly unsteady.      TODAY'S TREATMENT:                                                                                                                              [facility] Adult PT Treatment:                                                DATE: 06/06/2022 Therapeutic Exercise: *** Manual Therapy: *** Neuromuscular re-ed: *** Therapeutic Activity: *** Modalities: *** Self Care: ***   [name] Adult PT  Treatment:                                                DATE: 06/03/22 Therapeutic Exercise: Quad set x10 RLE cues for form, positioning, and appropriate performance Knee flexion AAROM x10 RLE cues for positioning, appropriate ROM, and HEP HEP handout, education and review      PATIENT EDUCATION:  Education details: Pt education on PT impairments, prognosis, and POC. Informed consent. Rationale for interventions, safe/appropriate HEP performance. Monitoring symptoms, icing as appropriate, surgical healing, AD use for safety Person educated: Patient Education method: Explanation, Demonstration, Tactile cues, Verbal cues, and Handouts Education comprehension: verbalized understanding, returned demonstration, verbal cues required, tactile cues required,  and needs further education     HOME EXERCISE PROGRAM: Access Code: [email or web address] URL: [email or web address] Date: 06/03/2022 Prepared by: Enis Slipper   Exercises - Supine Quad Set  - 1 x daily - 7 x weekly - 3 sets - 10 reps - Seated Knee Flexion AAROM  - 1 x daily - 7 x weekly - 3 sets - 10 reps   ASSESSMENT:   CLINICAL IMPRESSION: ***   OBJECTIVE IMPAIRMENTS: Abnormal gait, decreased activity tolerance, decreased balance, decreased endurance, decreased mobility, difficulty walking, decreased ROM, decreased strength, increased edema, impaired perceived functional ability, improper body mechanics, and pain.    ACTIVITY LIMITATIONS: carrying, lifting, bending, standing, squatting, stairs, transfers, bed mobility, and locomotion level   PARTICIPATION LIMITATIONS: meal prep, cleaning, laundry, and community activity   PERSONAL FACTORS: Time since onset of injury/illness/exacerbation and 1 comorbidity: depression  are also affecting patient's functional outcome.    REHAB POTENTIAL: Good   CLINICAL DECISION MAKING: Evolving/moderate complexity   EVALUATION COMPLEXITY: Low     GOALS: Goals reviewed with patient? No   SHORT TERM GOALS: Target date: 07/08/2022 Pt will demonstrate appropriate understanding and performance of initially prescribed HEP in order to facilitate improved independence with management of symptoms.  Baseline: HEP provided on eval Goal status: INITIAL    2. Pt will score greater than or equal to 48 on LEFS in order to demonstrate improved perception of function due to symptoms.            Baseline: 37            Goal status: INITIAL     LONG TERM GOALS: Target date: 08/12/2022    Pt will score 55 or greater on LEFS in order to demonstrate improved perception of function due to symptoms.  Baseline: 37/80 Goal status: INITIAL   2.  Pt will demonstrate at least 0-110 degrees of L knee AROM in order to facilitate improved tolerance to functional  movements such as bending/squatting.  Baseline: see ROM chart above Goal status: INITIAL   3.  Pt will be able to lift up to 25# with less than 2 pt increase in resting pain on NPS in order to demonstrate improved capacity for daily activities such as housework and interacting with grandchildren.  Baseline: NT due to proximity to surgery Goal status: INITIAL   4.  Pt will be able to perform sit to stand transfer without UE support and grossly appropriate mechanics in order to maximize safety w/ transfers. Baseline: see above - altered mechanics and requires B UE support Goal status: INITIAL    5. Pt will be able to safely navigate up to 18 stairs with unilat UE  support in order to maximize safety w/ home navigation and reduce fall risk            Baseline: inc time/effort and difficulty reported with stair navigation at home            Goal status: INITIAL      PLAN:   PT FREQUENCY: 2x/week   PT DURATION: 10 weeks   PLANNED INTERVENTIONS: Therapeutic exercises, Therapeutic activity, Neuromuscular re-education, Balance training, Gait training, Patient/Family education, Self Care, Joint mobilization, Stair training, DME instructions, Aquatic Therapy, Dry Needling, Electrical stimulation, Cryotherapy, Moist heat, scar mobilization, Taping, Manual therapy, and Re-evaluation   PLAN FOR NEXT SESSION: Emphasis on quad activation and knee flexion. Review/update HEP PRN   Altamese Deguire PT, DPT, LAT, ATC  06/06/22  9:10 AM

## 2022-06-14 ENCOUNTER — Other Ambulatory Visit: Payer: Self-pay

## 2022-06-14 ENCOUNTER — Encounter: Payer: Self-pay | Admitting: Physical Therapy

## 2022-06-14 ENCOUNTER — Encounter: Payer: Self-pay | Admitting: Family Medicine

## 2022-06-14 ENCOUNTER — Ambulatory Visit: Payer: Medicaid Other | Attending: Child and Adolescent Psychiatry | Admitting: Physical Therapy

## 2022-06-14 ENCOUNTER — Other Ambulatory Visit: Payer: Self-pay | Admitting: Family Medicine

## 2022-06-14 ENCOUNTER — Ambulatory Visit (INDEPENDENT_AMBULATORY_CARE_PROVIDER_SITE_OTHER): Payer: Medicaid Other | Admitting: Family Medicine

## 2022-06-14 VITALS — BP 133/74 | HR 79 | Ht 61.0 in | Wt 238.2 lb

## 2022-06-14 DIAGNOSIS — R2689 Other abnormalities of gait and mobility: Secondary | ICD-10-CM | POA: Insufficient documentation

## 2022-06-14 DIAGNOSIS — N644 Mastodynia: Secondary | ICD-10-CM | POA: Diagnosis not present

## 2022-06-14 DIAGNOSIS — M25562 Pain in left knee: Secondary | ICD-10-CM | POA: Insufficient documentation

## 2022-06-14 DIAGNOSIS — N611 Abscess of the breast and nipple: Secondary | ICD-10-CM

## 2022-06-14 DIAGNOSIS — R600 Localized edema: Secondary | ICD-10-CM | POA: Insufficient documentation

## 2022-06-14 MED ORDER — DOXYCYCLINE HYCLATE 100 MG PO TABS: 100.0000 mg | ORAL_TABLET | Freq: Two times a day (BID) | ORAL | 0 refills | Status: DC

## 2022-06-14 MED ORDER — OMEPRAZOLE 20 MG PO CPDR: 20.0000 mg | DELAYED_RELEASE_CAPSULE | Freq: Every day | ORAL | 0 refills | Status: DC

## 2022-06-14 NOTE — Patient Instructions (Signed)
I am sending in for antibiotics you will take twice a day for the next 10 days.  Make sure to take the medication with food as it can upset your stomach.  Please make sure to follow-up if you are having any sick stomach symptoms such as fevers, chills, fatigue or weight loss.  I put in a referral to general surgery, that will be the people who would take out this past mass.  If you do not hear anything over the next 2 weeks and call our office back and let us know.

## 2022-06-14 NOTE — Therapy (Addendum)
PHYSICAL THERAPY DISCHARGE SUMMARY  Visits from Start of Care: 2  Current functional level related to goals / functional outcomes: Current status unknown   Remaining deficits: Current status unknown due to pt not returning   Education / Equipment: HEP   Patient agrees to discharge. Patient goals were not met. Patient is being discharged due to not returning since the last visit.     Lulu Riding PT, DPT, LAT, ATC  08/16/22  9:11 AM              OUTPATIENT PHYSICAL THERAPY TREATMENT NOTE   Patient Name: Anna Moses MRN: [id number] DOB:[birth year], 47 y.o., female Today's Date: 06/14/2022  PCP: Bess Kinds, MD  REFERRING PROVIDER: Bjorn Pippin, MD   END OF SESSION:   PT End of Session - 06/14/22 [phone]     Visit Number 2    Number of Visits 21    Date for PT Re-Evaluation 07/29/22    Authorization Type [place] MCD    Authorization Time Period MCD  3 initial visits 06/06/22-03/10/2    Authorization - Visit Number 1    Authorization - Number of Visits 3   inital   PT Stop Time 0932    Activity Tolerance Patient tolerated treatment well;No increased pain    Behavior During Therapy WFL for tasks assessed/performed             Past Medical History:  Diagnosis Date   Asthma    Bronchitis    Depression    Obesity    Schizophrenia (HCC)    Past Surgical History:  Procedure Laterality Date   CESAREAN SECTION     CHONDROPLASTY Left 06/02/2022   Procedure: CHONDROPLASTY;  Surgeon: Bjorn Pippin, MD;  Location: [facility];  Service: Orthopedics;  Laterality: Left;   COLONOSCOPY  06/27/2019   KNEE ARTHROSCOPY WITH LATERAL RELEASE Left 06/02/2022   Procedure: KNEE ARTHROSCOPY WITH LATERAL RELEASE;  Surgeon: Bjorn Pippin, MD;  Location: [facility];  Service: Orthopedics;  Laterality: Left;   KNEE ARTHROSCOPY WITH MEDIAL MENISECTOMY Left 06/02/2022   Procedure: KNEE ARTHROSCOPY WITH MEDIAL MENISECTOMY;  Surgeon:  Bjorn Pippin, MD;  Location: [facility];  Service: Orthopedics;  Laterality: Left;   UPPER GASTROINTESTINAL ENDOSCOPY  06/27/2019   Patient Active Problem List   Diagnosis Date Noted   Foreign body in left ear 10/08/2021   Hematuria 09/09/2021   Breast pain in female 08/29/2021   Muscle strain 08/13/2021   Lower back pain 07/14/2021   Contraception management 11/17/2020   Depression, recurrent (HCC) 11/17/2020   Effusion of left knee 08/15/2020   Cervical cancer screening 09/18/2019   Routine screening for STI (sexually transmitted infection) 09/18/2019   Cyclical vomiting 09/03/2019   Schizophrenia (HCC) 07/24/2019   H. pylori infection 07/24/2019   Seasonal allergies 07/24/2019   Dysphagia 06/03/2019   Nausea and vomiting 06/03/2019   Liver lesion 06/03/2019   Preop examination 06/03/2019    REFERRING DIAG: Left knee arthroscopy with lateral release and patellar chondroplasty Per referral can begin therapy 1-3 days after surgery  THERAPY DIAG:  Left knee pain, unspecified chronicity  Localized edema  Other abnormalities of gait and mobility  Rationale for Evaluation and Treatment Rehabilitation  PERTINENT HISTORY: hx schizophrenia/depression, s/p left knee arthroscopy with lateral release and patellar chondroplasty   PRECAUTIONS: Left knee arthroscopy with partial medial meniscectomy Left knee lateral release (Above per op note) Performed on 06/02/22  WEIGHT BEARING RESTRICTIONS:  No (WBAT per op note)   SUBJECTIVE:                                                                                                                                                                                      SUBJECTIVE STATEMENT:  I have been using a brace with ice pack at home and that helps. I try to move and exercise but it is touch to bend.  Pain today 4/10. Sometimes I use  my crutches but not today.    PAIN:  Are you having pain: yes, 6/10 L  knee Location/description: L knee Best-worst over past week: 6-10/10  Per eval -  - aggravating factors: WB, bending, walking/standing - Easing factors: medication   OBJECTIVE: (objective measures completed at initial evaluation unless otherwise dated)   DIAGNOSTIC FINDINGS: post op 06/02/22   PATIENT SURVEYS:  LEFS: 37/80   COGNITION: Overall cognitive status: Within functional limits for tasks assessed                         SENSATION: Denies any sensory complaints, unable to assess d/t dressing/wrap   EDEMA/INSPECTION:  Apparent edema about knee joint grossly WNL although formal assessment deferred due to dressing/wrap from knee joint to ankle, pt states she was instructed to leave on for at least three days    POSTURE: rounded shoulders, forward head, and increased thoracic kyphosis   PALPATION: Mild tightness as expected L quad   LOWER EXTREMITY ROM:   Active ROM Right eval Left eval LT  Hip flexion       Hip extension       Hip abduction       Hip adduction       Hip internal rotation       Hip external rotation       Knee flexion 130 deg 50 deg 80 deg  Knee extension 0 ~0 (apparent, confounded by thick wrapping/dressing) 0 without dressings  Ankle dorsiflexion       Ankle plantarflexion       Ankle inversion       Ankle eversion        (Blank rows = not tested)   LOWER EXTREMITY MMT:     MMT Right eval Left eval  Hip flexion 5    Hip abduction (modified sitting) 5    Hip internal rotation      Hip external rotation      Knee flexion 5    Knee extension 5      (Blank rows = not tested) (Key: WFL = within functional limits not formally assessed, * = concordant pain, s =  stiffness/stretching sensation, NT = not tested)  Comments:  surgical limb deferred given proximity to surgery       FUNCTIONAL TESTS:  Sit to stand transfer: B UE support, weight shift to R with anterior placement LLE, inc time and effort    GAIT: Distance walked: within  clinic Assistive device utilized: None Level of assistance: Complete Independence Comments: significant antalgic gait, reduced stance on L, trunk lean to R, reduced trunk rotation/arm swing. No LOB but appears mildly unsteady.      TODAY'S TREATMENT:   [name]                                                DATE: 06-14-22 Therapeutic Exercise: Quad set 1 x 20, 1 x 15 with cues, positioning and towel for sensory input for appropriated execution SLR LT 3 x 10 with VC for DF and abd setting before SLR SAQ 3 x 10 VC for pace Hip abduction LT in RT sidelying with VC and TC for correct biomechanics 3 x 10 Hip extension LT in RT sidelying with VC and TC for correct biomechanics  3 x 10 Seated Knee flexion AAROM x10 LT LE cues for positioning, using washcloth on floor to decrease friction  Manual Therapy: Retrograde massage to decrease knee edema                                                                                                                         [name]                                                DATE: 06/03/22 Therapeutic Exercise: Quad set x10 RLE cues for form, positioning, and appropriate performance Knee flexion AAROM x10 RLE cues for positioning, appropriate ROM, and HEP HEP handout, education and review      PATIENT EDUCATION:  Education details: Pt education on PT impairments, prognosis, and POC. Informed consent. Rationale for interventions, safe/appropriate HEP performance. Monitoring symptoms, icing as appropriate, surgical healing, AD use for safety Person educated: Patient Education method: Explanation, Demonstration, Tactile cues, Verbal cues, and Handouts Education comprehension: verbalized understanding, returned demonstration, verbal cues required, tactile cues required, and needs further education     HOME EXERCISE PROGRAM: Access Code: [email or web address] URL: [email or web address] Date: 06/14/2022 Prepared by:  Garen Lah  Exercises - Supine Quad Set  - 1 x daily - 7 x weekly - 3 sets - 10 reps - Seated Knee Flexion AAROM  - 1 x daily - 7 x weekly - 3 sets - 10 reps - Active Straight Leg Raise with Quad Set  - 1 x daily - 7 x weekly - 3 sets - 10 reps -  Seated Long Arc Quad  - 1 x daily - 7 x weekly - 3 sets - 10 reps - Sidelying Hip Abduction  - 1 x daily - 7 x weekly - 3 sets - 10 reps ASSESSMENT:   CLINICAL IMPRESSION:  Ms Bascue returns without AD and 4/10 and was reduced to 0/10 after exercise.  Pt gained in LT knee flexion to 80 degrees today. Pt HEP updated for additional exercise to encourage knee flexion as pt tolerates.  Ms Semones also received retrograde massage to reduce edema and pain.  No adverse events, denies any increase in pain on departure. Recommend skilled PT to address post surgical deficits and maximize functional tolerance. Pt departs today's session in no acute distress, all voiced questions/concerns addressed appropriately from PT perspective    EVAL- Patient is a pleasant 47 y.o. woman who was seen today for physical therapy evaluation and treatment for L knee pain s/p arthroscopic partial medial meniscectomy with lateral release and patellar chondroplasty on 06/02/22. Per referral, can begin therapy 1-3 days after surgery. Pt arrives w/ 6/10 pain, ROM deficits as expected POD1. Deferred MMT given proximity to surgery, gait impairments as above. No apparent indication of complications at this point, educated pt on monitoring for adverse symptoms/events, she verbalizes understanding. Also encouraged use of axillary crutches for now due to gait impairments as above and mild unsteadiness, pt verbalizes agreement. HEP tolerated well as above, handout provided. No adverse events, denies any increase in pain on departure. Recommend skilled PT to address post surgical deficits and maximize functional tolerance. Pt departs today's session in no acute distress, all voiced  questions/concerns addressed appropriately from PT perspective.     OBJECTIVE IMPAIRMENTS: Abnormal gait, decreased activity tolerance, decreased balance, decreased endurance, decreased mobility, difficulty walking, decreased ROM, decreased strength, increased edema, impaired perceived functional ability, improper body mechanics, and pain.    ACTIVITY LIMITATIONS: carrying, lifting, bending, standing, squatting, stairs, transfers, bed mobility, and locomotion level   PARTICIPATION LIMITATIONS: meal prep, cleaning, laundry, and community activity   PERSONAL FACTORS: Time since onset of injury/illness/exacerbation and 1 comorbidity: depression  are also affecting patient's functional outcome.    REHAB POTENTIAL: Good   CLINICAL DECISION MAKING: Evolving/moderate complexity   EVALUATION COMPLEXITY: Low     GOALS: Goals reviewed with patient? No   SHORT TERM GOALS: Target date: 07/08/2022 Pt will demonstrate appropriate understanding and performance of initially prescribed HEP in order to facilitate improved independence with management of symptoms.  Baseline: HEP provided on eval Goal status: INITIAL    2. Pt will score greater than or equal to 48 on LEFS in order to demonstrate improved perception of function due to symptoms.            Baseline: 37            Goal status: INITIAL     LONG TERM GOALS: Target date: 08/12/2022    Pt will score 55 or greater on LEFS in order to demonstrate improved perception of function due to symptoms.  Baseline: 37/80 Goal status: INITIAL   2.  Pt will demonstrate at least 0-110 degrees of L knee AROM in order to facilitate improved tolerance to functional movements such as bending/squatting.  Baseline: see ROM chart above Goal status: INITIAL   3.  Pt will be able to lift up to 25# with less than 2 pt increase in resting pain on NPS in order to demonstrate improved capacity for daily activities such as housework and interacting with grandchildren.  Baseline: NT due to proximity to surgery Goal status: INITIAL   4.  Pt will be able to perform sit to stand transfer without UE support and grossly appropriate mechanics in order to maximize safety w/ transfers. Baseline: see above - altered mechanics and requires B UE support Goal status: INITIAL    5. Pt will be able to safely navigate up to 18 stairs with unilat UE support in order to maximize safety w/ home navigation and reduce fall risk            Baseline: inc time/effort and difficulty reported with stair navigation at home            Goal status: INITIAL      PLAN:   PT FREQUENCY: 2x/week   PT DURATION: 10 weeks   PLANNED INTERVENTIONS: Therapeutic exercises, Therapeutic activity, Neuromuscular re-education, Balance training, Gait training, Patient/Family education, Self Care, Joint mobilization, Stair training, DME instructions, Aquatic Therapy, Dry Needling, Electrical stimulation, Cryotherapy, Moist heat, scar mobilization, Taping, Manual therapy, and Re-evaluation   PLAN FOR NEXT SESSION: Emphasis on quad activation and knee flexion. Review/update HEP PRN       Garen Lah, PT, ATRIC Certified Exercise Expert for the Aging Adult  06/14/22 10:21 AM Phone: [phone] Fax: [phone]

## 2022-06-14 NOTE — Progress Notes (Signed)
    SUBJECTIVE:   CHIEF COMPLAINT / HPI:   Left breast pain - Had biopsy in April 2023 which showed an abscess, was treated with antibiotics and drainage - Is up to date with mammography and had US done - Was seen by plastic surgery on 12/24/2021 but patient is not interested in a breast reduction and they recommended her seeing [facility] for excision - Painful in the last few days with more swelling - Hasn't noticed that anything is draining from the area  PERTINENT  PMH / PSH: Reviewed  OBJECTIVE:   BP 133/74   Pulse 79   Ht '5\' 1"'$  (1.549 m)   Wt 238 lb 3.2 oz (108 kg)   LMP  (LMP Unknown) Comment: depo  SpO2 100%   BMI 45.01 kg/m   General: NAD, well-appearing, well-nourished Respiratory: No respiratory distress, breathing comfortably, able to speak in full sentences Skin: warm and dry, no rashes noted on exposed skin Psych: Appropriate affect and mood Breast: area of painful fluctuance in left retroareolar region  ASSESSMENT/PLAN:   Breast pain in female Previous biopsy completed and known abscess. Has had increased inflammation and pain for the last 2-3 days. Patient has updated mammogram and breast US. Not interested in breast reduction but would like abscess/cyst excision. - Referral to general surgery for excision - Doxycycline '100mg'$  BID x10 days - Tylenol PRN for pain (can use omeprazole while taking due to stomach pain)     Rise Patience, DO [place]

## 2022-06-15 NOTE — Assessment & Plan Note (Signed)
Previous biopsy completed and known abscess. Has had increased inflammation and pain for the last 2-3 days. Patient has updated mammogram and breast US. Not interested in breast reduction but would like abscess/cyst excision. - Referral to general surgery for excision - Doxycycline '100mg'$  BID x10 days - Tylenol PRN for pain (can use omeprazole while taking due to stomach pain)

## 2022-06-16 ENCOUNTER — Ambulatory Visit: Payer: Medicaid Other | Admitting: Physical Therapy

## 2022-06-17 ENCOUNTER — Encounter (HOSPITAL_COMMUNITY): Payer: Self-pay

## 2022-06-17 ENCOUNTER — Other Ambulatory Visit: Payer: Self-pay

## 2022-06-17 ENCOUNTER — Emergency Department (HOSPITAL_BASED_OUTPATIENT_CLINIC_OR_DEPARTMENT_OTHER)
Admission: EM | Admit: 2022-06-17 | Discharge: 2022-06-17 | Disposition: A | Discharge status: Home / Self Care | Payer: Medicaid Other | Attending: Emergency Medicine | Admitting: Emergency Medicine

## 2022-06-17 ENCOUNTER — Emergency Department (HOSPITAL_BASED_OUTPATIENT_CLINIC_OR_DEPARTMENT_OTHER): Payer: Medicaid Other

## 2022-06-17 ENCOUNTER — Ambulatory Visit (HOSPITAL_COMMUNITY)
Admission: EM | Admit: 2022-06-17 | Discharge: 2022-06-17 | Disposition: A | Discharge status: Home / Self Care | Payer: Medicaid Other | Attending: Emergency Medicine | Admitting: Emergency Medicine

## 2022-06-17 ENCOUNTER — Encounter (HOSPITAL_BASED_OUTPATIENT_CLINIC_OR_DEPARTMENT_OTHER): Payer: Self-pay | Admitting: Emergency Medicine

## 2022-06-17 DIAGNOSIS — R1084 Generalized abdominal pain: Secondary | ICD-10-CM | POA: Diagnosis not present

## 2022-06-17 DIAGNOSIS — Z7951 Long term (current) use of inhaled steroids: Secondary | ICD-10-CM | POA: Insufficient documentation

## 2022-06-17 DIAGNOSIS — F1110 Opioid abuse, uncomplicated: Secondary | ICD-10-CM | POA: Insufficient documentation

## 2022-06-17 DIAGNOSIS — J45909 Unspecified asthma, uncomplicated: Secondary | ICD-10-CM | POA: Insufficient documentation

## 2022-06-17 DIAGNOSIS — K2900 Acute gastritis without bleeding: Secondary | ICD-10-CM

## 2022-06-17 DIAGNOSIS — G43A0 Cyclical vomiting, not intractable: Secondary | ICD-10-CM | POA: Diagnosis not present

## 2022-06-17 DIAGNOSIS — Z3202 Encounter for pregnancy test, result negative: Secondary | ICD-10-CM | POA: Diagnosis not present

## 2022-06-17 DIAGNOSIS — Z7982 Long term (current) use of aspirin: Secondary | ICD-10-CM | POA: Insufficient documentation

## 2022-06-17 DIAGNOSIS — R1115 Cyclical vomiting syndrome unrelated to migraine: Secondary | ICD-10-CM

## 2022-06-17 DIAGNOSIS — R112 Nausea with vomiting, unspecified: Secondary | ICD-10-CM | POA: Diagnosis present

## 2022-06-17 DIAGNOSIS — F1210 Cannabis abuse, uncomplicated: Secondary | ICD-10-CM | POA: Insufficient documentation

## 2022-06-17 LAB — COMPREHENSIVE METABOLIC PANEL
ALT: 15 U/L (ref 0–44)
AST: 14 U/L — ABNORMAL LOW (ref 15–41)
Albumin: 4.3 g/dL (ref 3.5–5.0)
Alkaline Phosphatase: 56 U/L (ref 38–126)
Anion gap: 14 (ref 5–15)
BUN: 18 mg/dL (ref 6–20)
CO2: 21 mmol/L — ABNORMAL LOW (ref 22–32)
Calcium: 10 mg/dL (ref 8.9–10.3)
Chloride: 104 mmol/L (ref 98–111)
Creatinine, Ser: 1.2 mg/dL — ABNORMAL HIGH (ref 0.44–1.00)
GFR, Estimated: 57 mL/min — ABNORMAL LOW (ref >60)
Glucose, Bld: 131 mg/dL — ABNORMAL HIGH (ref 70–99)
Potassium: 3.8 mmol/L (ref 3.5–5.1)
Sodium: 139 mmol/L (ref 135–145)
Total Bilirubin: 0.7 mg/dL (ref 0.3–1.2)
Total Protein: 8.7 g/dL — ABNORMAL HIGH (ref 6.5–8.1)

## 2022-06-17 LAB — RAPID URINE DRUG SCREEN, HOSP PERFORMED
Amphetamines: NOT DETECTED
Barbiturates: NOT DETECTED
Benzodiazepines: NOT DETECTED
Cocaine: NOT DETECTED
Opiates: POSITIVE — AB
Tetrahydrocannabinol: POSITIVE — AB

## 2022-06-17 LAB — URINALYSIS, ROUTINE W REFLEX MICROSCOPIC
Bacteria, UA: NONE SEEN
Bilirubin Urine: NEGATIVE
Glucose, UA: NEGATIVE mg/dL
Leukocytes,Ua: NEGATIVE
Nitrite: NEGATIVE
Protein, ur: 30 mg/dL — AB
Specific Gravity, Urine: 1.032 — ABNORMAL HIGH (ref 1.005–1.030)
pH: 5.5 (ref 5.0–8.0)

## 2022-06-17 LAB — LIPASE, BLOOD: Lipase: 20 U/L (ref 11–51)

## 2022-06-17 LAB — CBC WITH DIFFERENTIAL/PLATELET
Abs Immature Granulocytes: 0.04 10*3/uL (ref 0.00–0.07)
Basophils Absolute: 0 10*3/uL (ref 0.0–0.1)
Basophils Relative: 0 %
Eosinophils Absolute: 0 10*3/uL (ref 0.0–0.5)
Eosinophils Relative: 0 %
HCT: 40.9 % (ref 36.0–46.0)
Hemoglobin: 13.9 g/dL (ref 12.0–15.0)
Immature Granulocytes: 0 %
Lymphocytes Relative: 20 %
Lymphs Abs: 2.4 10*3/uL (ref 0.7–4.0)
MCH: 29 pg (ref 26.0–34.0)
MCHC: 34 g/dL (ref 30.0–36.0)
MCV: 85.4 fL (ref 80.0–100.0)
Monocytes Absolute: 0.6 10*3/uL (ref 0.1–1.0)
Monocytes Relative: 5 %
Neutro Abs: 9 10*3/uL — ABNORMAL HIGH (ref 1.7–7.7)
Neutrophils Relative %: 75 %
Platelets: 542 10*3/uL — ABNORMAL HIGH (ref 150–400)
RBC: 4.79 MIL/uL (ref 3.87–5.11)
RDW: 13.8 % (ref 11.5–15.5)
WBC: 12.1 10*3/uL — ABNORMAL HIGH (ref 4.0–10.5)
nRBC: 0 % (ref 0.0–0.2)

## 2022-06-17 LAB — HCG, SERUM, QUALITATIVE: Preg, Serum: NEGATIVE

## 2022-06-17 LAB — POC URINE PREG, ED: Preg Test, Ur: NEGATIVE

## 2022-06-17 MED ORDER — ALUM & MAG HYDROXIDE-SIMETH 200-200-20 MG/5ML PO SUSP
ORAL | Status: AC
  Filled 2022-06-17: qty 30

## 2022-06-17 MED ORDER — ALUM & MAG HYDROXIDE-SIMETH 200-200-20 MG/5ML PO SUSP
30.0000 mL | Freq: Once | ORAL | Status: AC
  Administered 2022-06-17: 30 mL via ORAL

## 2022-06-17 MED ORDER — ONDANSETRON HCL 4 MG/2ML IJ SOLN
4.0000 mg | Freq: Once | INTRAMUSCULAR | Status: AC
  Administered 2022-06-17: 4 mg via INTRAVENOUS
  Filled 2022-06-17: qty 2

## 2022-06-17 MED ORDER — ONDANSETRON 4 MG PO TBDP
ORAL_TABLET | ORAL | Status: AC
  Filled 2022-06-17: qty 1

## 2022-06-17 MED ORDER — SODIUM CHLORIDE 0.9 % IV BOLUS
1000.0000 mL | Freq: Once | INTRAVENOUS | Status: AC
  Administered 2022-06-17: 1000 mL via INTRAVENOUS

## 2022-06-17 MED ORDER — IOHEXOL 300 MG/ML  SOLN
100.0000 mL | Freq: Once | INTRAMUSCULAR | Status: AC | PRN
  Administered 2022-06-17: 100 mL via INTRAVENOUS

## 2022-06-17 MED ORDER — OMEPRAZOLE 20 MG PO CPDR: 20.0000 mg | DELAYED_RELEASE_CAPSULE | Freq: Every day | ORAL | 0 refills | Status: DC

## 2022-06-17 MED ORDER — FAMOTIDINE 20 MG PO TABS: 20.0000 mg | ORAL_TABLET | Freq: Two times a day (BID) | ORAL | 0 refills | Status: DC

## 2022-06-17 MED ORDER — SODIUM CHLORIDE 0.9 % IV SOLN: INTRAVENOUS | Status: DC

## 2022-06-17 MED ORDER — KETOROLAC TROMETHAMINE 30 MG/ML IJ SOLN
INTRAMUSCULAR | Status: AC
  Filled 2022-06-17: qty 1

## 2022-06-17 MED ORDER — METOCLOPRAMIDE HCL 5 MG/ML IJ SOLN
10.0000 mg | Freq: Once | INTRAMUSCULAR | Status: AC
  Administered 2022-06-17: 10 mg via INTRAVENOUS
  Filled 2022-06-17: qty 2

## 2022-06-17 MED ORDER — KETOROLAC TROMETHAMINE 30 MG/ML IJ SOLN
30.0000 mg | Freq: Once | INTRAMUSCULAR | Status: AC
  Administered 2022-06-17: 30 mg via INTRAMUSCULAR

## 2022-06-17 MED ORDER — PANTOPRAZOLE SODIUM 40 MG IV SOLR
40.0000 mg | Freq: Once | INTRAVENOUS | Status: AC
  Administered 2022-06-17: 40 mg via INTRAVENOUS
  Filled 2022-06-17: qty 10

## 2022-06-17 MED ORDER — ONDANSETRON 4 MG PO TBDP
4.0000 mg | ORAL_TABLET | Freq: Once | ORAL | Status: AC
  Administered 2022-06-17: 4 mg via ORAL

## 2022-06-17 MED ORDER — LIDOCAINE VISCOUS HCL 2 % MT SOLN
15.0000 mL | Freq: Once | OROMUCOSAL | Status: AC
  Administered 2022-06-17: 15 mL via OROMUCOSAL

## 2022-06-17 MED ORDER — HYDROMORPHONE HCL 1 MG/ML IJ SOLN
1.0000 mg | Freq: Once | INTRAMUSCULAR | Status: AC
  Administered 2022-06-17: 1 mg via INTRAVENOUS
  Filled 2022-06-17: qty 1

## 2022-06-17 MED ORDER — LIDOCAINE VISCOUS HCL 2 % MT SOLN
OROMUCOSAL | Status: AC
  Filled 2022-06-17: qty 15

## 2022-06-17 NOTE — ED Provider Notes (Signed)
[facility]    CSN: [id number] Arrival date & time: 06/17/22  0900      History   Chief Complaint Chief Complaint  Patient presents with   Abdominal Pain   Emesis    HPI Anna Moses is a 47 y.o. female.  Here with 2 day history of epigastric pain Today 8/10  Nausea with several episodes of vomiting today. Some diarrhea. Unable to keep down food or fluids Reports headache as well   No fever. Denies urinary symptoms LMP unknown - depo injections  History of reflux and stomach ulcer Occasionally takes Protonix    Past Medical History:  Diagnosis Date   Asthma    Bronchitis    Depression    Obesity    Schizophrenia [facility])     Patient Active Problem List   Diagnosis Date Noted   Foreign body in left ear 10/08/2021   Hematuria 09/09/2021   Breast pain in female 08/29/2021   Muscle strain 08/13/2021   Lower back pain 07/14/2021   Contraception management 11/17/2020   Depression, recurrent ([place]) 11/17/2020   Effusion of left knee 08/15/2020   Cervical cancer screening 09/18/2019   Routine screening for STI (sexually transmitted infection) [id number]   Cyclical vomiting [id number]   Schizophrenia ([place]) 07/24/2019   H. pylori infection 07/24/2019   Seasonal allergies 07/24/2019   Dysphagia 06/03/2019   Nausea and vomiting 06/03/2019   Liver lesion 06/03/2019   Preop examination 06/03/2019    Past Surgical History:  Procedure Laterality Date   CESAREAN SECTION     CHONDROPLASTY Left 06/02/2022   Procedure: CHONDROPLASTY;  Surgeon: Hiram Gash, MD;  Location: [place];  Service: Orthopedics;  Laterality: Left;   COLONOSCOPY  06/27/2019   KNEE ARTHROSCOPY WITH LATERAL RELEASE Left 06/02/2022   Procedure: KNEE ARTHROSCOPY WITH LATERAL RELEASE;  Surgeon: Hiram Gash, MD;  Location: [place];  Service: Orthopedics;  Laterality: Left;   KNEE ARTHROSCOPY WITH MEDIAL MENISECTOMY Left 06/02/2022   Procedure: KNEE  ARTHROSCOPY WITH MEDIAL MENISECTOMY;  Surgeon: Hiram Gash, MD;  Location: [place];  Service: Orthopedics;  Laterality: Left;   UPPER GASTROINTESTINAL ENDOSCOPY  06/27/2019    OB History   No obstetric history on file.      Home Medications    Prior to Admission medications   Medication Sig Start Date End Date Taking? Authorizing Provider  omeprazole (PRILOSEC) 20 MG capsule Take 1 capsule (20 mg total) by mouth daily for 14 days. 06/17/22 07/01/22 Yes Glen Blatchley, Wells Guiles, PA-C  albuterol (VENTOLIN HFA) 108 (90 Base) MCG/ACT inhaler Inhale 1-2 puffs into the lungs every 6 (six) hours as needed for wheezing or shortness of breath. 05/18/22   Talbot Grumbling, FNP  aspirin (ASPIRIN CHILDRENS) 81 MG chewable tablet Chew 1 tablet (81 mg total) by mouth 2 (two) times daily. For 6 weeks for DVT prophylaxis after surgery 06/02/22 07/14/22  Ethelda Chick, PA-C  doxycycline (VIBRA-TABS) 100 MG tablet Take 1 tablet (100 mg total) by mouth 2 (two) times daily. 06/14/22   Lilland, Alana, DO  diphenhydrAMINE (BENADRYL) 25 MG tablet Take 1 tablet (25 mg total) by mouth every 6 (six) hours. Patient not taking: Reported on 12/12/2018 10/21/17 05/30/19  Charlesetta Shanks, MD  Ibuprofen-diphenhydrAMINE Cit (ADVIL PM) 200-38 MG TABS Take 2 tablets by mouth 2 (two) times daily as needed (headaches).  05/30/19  [provider]    Family History Family History  Problem Relation Age of Onset  Crohn's disease Mother    Liver disease Father    Clotting disorder Father    Liver disease Brother    Colon cancer Neg Hx    Esophageal cancer Neg Hx    Rectal cancer Neg Hx    Stomach cancer Neg Hx    Breast cancer Neg Hx     Social History Social History   Tobacco Use   Smoking status: Some Days    Packs/day: 0.50    Types: Cigarettes   Smokeless tobacco: Never  Vaping Use   Vaping Use: Never used  Substance Use Topics   Alcohol use: Yes     Allergies   Patient has no known  allergies.   Review of Systems Review of Systems As per HPI  Physical Exam Triage Vital Signs ED Triage Vitals  Enc Vitals Group     BP 06/17/22 1017 133/81     Pulse Rate 06/17/22 1017 65     Resp 06/17/22 1017 18     Temp 06/17/22 1017 99.2 F (37.3 C)     Temp Source 06/17/22 1017 Oral     SpO2 06/17/22 1017 98 %     Weight --      Height --      Head Circumference --      Peak Flow --      Pain Score 06/17/22 1018 8     Pain Loc --      Pain Edu? --      Excl. in [place]? --    No data found.  Updated Vital Signs BP 133/81 (BP Location: Right Arm)   Pulse 65   Temp 99.2 F (37.3 C) (Oral)   Resp 18   LMP  (LMP Unknown) Comment: depo  SpO2 98%    Physical Exam Vitals and nursing note reviewed.  Constitutional:      General: She is not in acute distress.    Appearance: She is not ill-appearing.     Comments: tearful  HENT:     Mouth/Throat:     Mouth: Mucous membranes are moist.     Pharynx: Oropharynx is clear.  Eyes:     Conjunctiva/sclera: Conjunctivae normal.  Cardiovascular:     Rate and Rhythm: Normal rate and regular rhythm.     Heart sounds: Normal heart sounds.  Pulmonary:     Effort: Pulmonary effort is normal.     Breath sounds: Normal breath sounds.  Abdominal:     Palpations: Abdomen is soft.     Tenderness: There is abdominal tenderness in the epigastric area. There is no guarding or rebound.     Comments: [place] limits exam  Skin:    General: Skin is warm and dry.  Neurological:     Mental Status: She is alert and oriented to person, place, and time.      UC Treatments / Results  Labs (all labs ordered are listed, but only abnormal results are displayed) Labs Reviewed  POC URINE PREG, ED    EKG   Radiology No results found.  Procedures Procedures (including critical care time)  Medications Ordered in UC Medications  ketorolac (TORADOL) 30 MG/ML injection 30 mg (30 mg Intramuscular Given 06/17/22 1046)  ondansetron  (ZOFRAN-ODT) disintegrating tablet 4 mg (4 mg Oral Given 06/17/22 1046)  alum & mag hydroxide-simeth (MAALOX/MYLANTA) 200-200-20 MG/5ML suspension 30 mL (30 mLs Oral Given 06/17/22 1115)  lidocaine (XYLOCAINE) 2 % viscous mouth solution 15 mL (15 mLs Mouth/Throat Given 06/17/22 1115)  Initial Impression / Assessment and Plan / UC Course  I have reviewed the triage vital signs and the nursing notes.  Pertinent labs & imaging results that were available during my care of the patient were reviewed by me and considered in my medical decision making (see chart for details).  UPT negative IM toradol given with Zofran ODT. Reports episode of vomiting a few minutes after zofran. GI cocktail given. Symptoms started to subside On repeat eval she reports still nauseous and having pain. No further emesis  Offered Reglan IM but patient wants to go home P.O challenge successful   Recommend 2 week omeprazole with diet changes, close follow with GI Strict ED precautions   Final Clinical Impressions(s) / UC Diagnoses   Final diagnoses:  Acute gastritis without hemorrhage, unspecified gastritis type     Discharge Instructions      Take the Prilosec once daily for 2 full weeks.  Bland diet for several days to a week. Avoid spicy, greasy, heavy foods that will upset stomach.   Please call the stomach specialists to set up an appointment as soon as possible Please go to the emergency department if symptoms worsen, especially if you are unable to keep down water.     ED Prescriptions     Medication Sig Dispense Auth. Provider   omeprazole (PRILOSEC) 20 MG capsule Take 1 capsule (20 mg total) by mouth daily for 14 days. 14 capsule Fraya Ueda, Wells Guiles, PA-C      PDMP not reviewed this encounter.   Jc Veron, Wells Guiles, PA-C 06/17/22 1150

## 2022-06-17 NOTE — ED Triage Notes (Signed)
Pt arrives to ED with c/o epigastric abdominal pain and emesis x2 days. Was seen at [facility] today.

## 2022-06-17 NOTE — ED Triage Notes (Signed)
Pt c/o center abdominal pain with n/v and little diarrhea x2 days. States her abdominal pain feels like hunger pain d/t unable to keep solids or liquids down.

## 2022-06-17 NOTE — Discharge Instructions (Addendum)
Keep your appointment with gastroenterology.  Take the Zofran that you have at home.  Take the Pepcid as directed.  If you start vomiting pools of blood need to get seen right away.  Also would stay away from marijuana as it can cause the cyclical vomiting.  Return for any new or worse symptoms.  He can also make an appointment to follow-up with your primary.  Take the Pepcid as directed for the next 2 weeks.

## 2022-06-17 NOTE — Discharge Instructions (Addendum)
Take the Prilosec once daily for 2 full weeks.  Bland diet for several days to a week. Avoid spicy, greasy, heavy foods that will upset stomach.   Please call the stomach specialists to set up an appointment as soon as possible Please go to the emergency department if symptoms worsen, especially if you are unable to keep down water.

## 2022-06-17 NOTE — ED Provider Notes (Addendum)
[facility] Provider Note   CSN: [id number] Arrival date & time: 06/17/22  1754     History  Chief Complaint  Patient presents with   Emesis   Abdominal Pain    Anna Moses is a 47 y.o. female.  Patient with acute on sat nausea and vomiting no diarrhea on Wednesday.  Patient now having vomiting with some streaks of blood.  Patient seen in urgent care earlier today.  Chart review shows that patient has been considered to maybe have cyclical vomiting syndrome in the past.  Patient has an appointment with gastroenterology for consideration for upper endoscopy.  Patient again denies any diarrhea denies any fever or chills.  Temp here however though is 100.2.  Blood pressure 149/81 oxygen saturation on room air 99%.  Patient states she is got diffuse abdominal pain.  Past medical history is significant for obesity depression schizophrenia asthma bronchitis.       Home Medications Prior to Admission medications   Medication Sig Start Date End Date Taking? Authorizing Provider  famotidine (PEPCID) 20 MG tablet Take 1 tablet (20 mg total) by mouth 2 (two) times daily. 06/17/22  Yes Fredia Sorrow, MD  albuterol (VENTOLIN HFA) 108 (90 Base) MCG/ACT inhaler Inhale 1-2 puffs into the lungs every 6 (six) hours as needed for wheezing or shortness of breath. 05/18/22   Talbot Grumbling, FNP  aspirin (ASPIRIN CHILDRENS) 81 MG chewable tablet Chew 1 tablet (81 mg total) by mouth 2 (two) times daily. For 6 weeks for DVT prophylaxis after surgery 06/02/22 07/14/22  Ethelda Chick, PA-C  doxycycline (VIBRA-TABS) 100 MG tablet Take 1 tablet (100 mg total) by mouth 2 (two) times daily. 06/14/22   Lilland, Alana, DO  omeprazole (PRILOSEC) 20 MG capsule Take 1 capsule (20 mg total) by mouth daily for 14 days. 06/17/22 07/01/22  Rising, Wells Guiles, PA-C  diphenhydrAMINE (BENADRYL) 25 MG tablet Take 1 tablet (25 mg total) by mouth every 6 (six) hours. Patient  not taking: Reported on 12/12/2018 10/21/17 05/30/19  Charlesetta Shanks, MD  Ibuprofen-diphenhydrAMINE Cit (ADVIL PM) 200-38 MG TABS Take 2 tablets by mouth 2 (two) times daily as needed (headaches).  05/30/19  [provider]      Allergies    Patient has no known allergies.    Review of Systems   Review of Systems  Constitutional:  Negative for chills and fever.  HENT:  Negative for ear pain and sore throat.   Eyes:  Negative for pain and visual disturbance.  Respiratory:  Negative for cough and shortness of breath.   Cardiovascular:  Negative for chest pain and palpitations.  Gastrointestinal:  Positive for abdominal pain, nausea and vomiting.  Genitourinary:  Negative for dysuria and hematuria.  Musculoskeletal:  Negative for arthralgias and back pain.  Skin:  Negative for color change and rash.  Neurological:  Negative for seizures and syncope.  All other systems reviewed and are negative.   Physical Exam Updated Vital Signs BP 129/67   Pulse 63   Temp 100.2 F (37.9 C) (Oral)   Resp 20   LMP  (LMP Unknown) Comment: depo  SpO2 95%  Physical Exam Vitals and nursing note reviewed.  Constitutional:      General: She is in acute distress.     Appearance: She is well-developed. She is obese.  HENT:     Head: Normocephalic and atraumatic.     Mouth/Throat:     Mouth: Mucous membranes are dry.  Eyes:  Extraocular Movements: Extraocular movements intact.     Conjunctiva/sclera: Conjunctivae normal.     Pupils: Pupils are equal, round, and reactive to light.  Cardiovascular:     Rate and Rhythm: Normal rate and regular rhythm.     Heart sounds: No murmur heard. Pulmonary:     Effort: Pulmonary effort is normal. No respiratory distress.     Breath sounds: Normal breath sounds.  Abdominal:     Palpations: Abdomen is soft.     Tenderness: There is generalized abdominal tenderness.  Musculoskeletal:        General: No swelling.     Cervical back: Normal range of  motion and neck supple.  Skin:    General: Skin is warm and dry.     Capillary Refill: Capillary refill takes less than 2 seconds.  Neurological:     General: No focal deficit present.     Mental Status: She is alert and oriented to person, place, and time.  Psychiatric:        Mood and Affect: Mood normal.     ED Results / Procedures / Treatments   Labs (all labs ordered are listed, but only abnormal results are displayed) Labs Reviewed  CBC WITH DIFFERENTIAL/PLATELET - Abnormal; Notable for the following components:      Result Value   WBC 12.1 (*)    Platelets 542 (*)    Neutro Abs 9.0 (*)    All other components within normal limits  COMPREHENSIVE METABOLIC PANEL - Abnormal; Notable for the following components:   CO2 21 (*)    Glucose, Bld 131 (*)    Creatinine, Ser 1.20 (*)    Total Protein 8.7 (*)    AST 14 (*)    GFR, Estimated 57 (*)    All other components within normal limits  URINALYSIS, ROUTINE W REFLEX MICROSCOPIC - Abnormal; Notable for the following components:   Specific Gravity, Urine 1.032 (*)    Hgb urine dipstick SMALL (*)    Ketones, ur TRACE (*)    Protein, ur 30 (*)    All other components within normal limits  RAPID URINE DRUG SCREEN, HOSP PERFORMED - Abnormal; Notable for the following components:   Opiates POSITIVE (*)    Tetrahydrocannabinol POSITIVE (*)    All other components within normal limits  LIPASE, BLOOD  HCG, SERUM, QUALITATIVE    EKG None  Radiology CT Abdomen Pelvis W Contrast  Result Date: 06/17/2022 CLINICAL DATA:  Abdominal pain. EXAM: CT ABDOMEN AND PELVIS WITH CONTRAST TECHNIQUE: Multidetector CT imaging of the abdomen and pelvis was performed using the standard protocol following bolus administration of intravenous contrast. RADIATION DOSE REDUCTION: This exam was performed according to the departmental dose-optimization program which includes automated exposure control, adjustment of the mA and/or kV according to patient  size and/or use of iterative reconstruction technique. CONTRAST:  171m OMNIPAQUE IOHEXOL 300 MG/ML  SOLN COMPARISON:  CT abdomen pelvis dated 02/08/2021. FINDINGS: Lower chest: The visualized lung bases are clear. No intra-abdominal free air or free fluid. Hepatobiliary: Fatty liver. No biliary dilatation. The gallbladder is unremarkable. Pancreas: Unremarkable. No pancreatic ductal dilatation or surrounding inflammatory changes. Spleen: Normal in size without focal abnormality. Adrenals/Urinary Tract: The adrenal glands are unremarkable. The kidneys, visualized ureters, and urinary bladder unremarkable. Stomach/Bowel: There is no bowel obstruction or active inflammation. The appendix is normal. Vascular/Lymphatic: Mild aortoiliac atherosclerotic disease. The IVC is unremarkable. No portal venous gas. There is no adenopathy. Reproductive: The uterus is anteverted. Small uterine fibroids noted.  No adnexal masses. Other: None Musculoskeletal: Mild degenerative changes. No acute osseous pathology. IMPRESSION: 1. No acute intra-abdominal or pelvic pathology. 2. Fatty liver. 3.  Aortic Atherosclerosis (ICD10-I70.0). Electronically Signed   By: Anner Crete M.D.   On: 06/17/2022 20:11    Procedures Procedures    Medications Ordered in ED Medications  0.9 %  sodium chloride infusion ( Intravenous New Bag/Given 06/17/22 2052)  sodium chloride 0.9 % bolus 1,000 mL (0 mLs Intravenous Stopped 06/17/22 2049)  HYDROmorphone (DILAUDID) injection 1 mg (1 mg Intravenous Given 06/17/22 1856)  metoCLOPramide (REGLAN) injection 10 mg (10 mg Intravenous Given 06/17/22 1856)  ondansetron (ZOFRAN) injection 4 mg (4 mg Intravenous Given 06/17/22 1856)  pantoprazole (PROTONIX) injection 40 mg (40 mg Intravenous Given 06/17/22 1855)  iohexol (OMNIPAQUE) 300 MG/ML solution 100 mL (100 mLs Intravenous Contrast Given 06/17/22 1943)    ED Course/ Medical Decision Making/ A&P                             Medical Decision  Making Amount and/or Complexity of Data Reviewed Labs: ordered. Radiology: ordered.  Risk Prescription drug management.   Patient with past history of cyclical vomiting syndrome.  Will give IV fluids.  Will give pain medication Zofran and a dose of Reglan.  Will check CBC complete metabolic panel urinalysis and drug screen and urine pregnancy test  Will also give Protonix because patient is vomiting some streaks of blood.  No pools of blood  Patient's labs urinalysis without any acute findings.  Not consistent with urinary tract infection.  Rapid drug screen positive for opiates positive for THC.  CBC white count 12.1 hemoglobin is good at 13.9.  Complete metabolic panel glucose [id number] creatinine up some at 1.2 GFR down a little bit at 57 patient given IV fluids here LFTs normal anion gap normal lipase normal.  Pregnancy test negative.  CT scan abdomen and pelvis no acute findings.  Suspect that the cyclical vomiting is may be secondary to [facility].  Had long discussion with patient.  She has follow-up with GI pending.  Patient without any further vomiting.  Has vomited some streaks of blood.  But her hemoglobins are stable.  Patient has Zofran at home.  Will start her on Pepcid for the next 2 weeks.   Final Clinical Impression(s) / ED Diagnoses Final diagnoses:  Cyclical vomiting with nausea    Rx / DC Orders ED Discharge Orders          Ordered    famotidine (PEPCID) 20 MG tablet  2 times daily        06/17/22 2109              Fredia Sorrow, MD 06/17/22 1845    Fredia Sorrow, MD 06/17/22 2111

## 2022-06-21 ENCOUNTER — Encounter (HOSPITAL_COMMUNITY): Payer: Self-pay

## 2022-06-21 ENCOUNTER — Ambulatory Visit: Payer: Medicaid Other | Admitting: Physical Therapy

## 2022-06-21 ENCOUNTER — Telehealth: Payer: Self-pay | Admitting: Physical Therapy

## 2022-06-21 ENCOUNTER — Other Ambulatory Visit: Payer: Self-pay

## 2022-06-21 ENCOUNTER — Emergency Department (HOSPITAL_COMMUNITY)
Admission: EM | Admit: 2022-06-21 | Discharge: 2022-06-21 | Disposition: A | Discharge status: Home / Self Care | Payer: Medicaid Other | Attending: Emergency Medicine | Admitting: Emergency Medicine

## 2022-06-21 ENCOUNTER — Ambulatory Visit (HOSPITAL_COMMUNITY)
Admission: EM | Admit: 2022-06-21 | Discharge: 2022-06-21 | Disposition: A | Discharge status: Home / Self Care | Payer: Medicaid Other | Attending: Internal Medicine | Admitting: Internal Medicine

## 2022-06-21 DIAGNOSIS — Z7982 Long term (current) use of aspirin: Secondary | ICD-10-CM | POA: Diagnosis not present

## 2022-06-21 DIAGNOSIS — J45909 Unspecified asthma, uncomplicated: Secondary | ICD-10-CM | POA: Insufficient documentation

## 2022-06-21 DIAGNOSIS — R112 Nausea with vomiting, unspecified: Secondary | ICD-10-CM | POA: Insufficient documentation

## 2022-06-21 DIAGNOSIS — R1115 Cyclical vomiting syndrome unrelated to migraine: Secondary | ICD-10-CM | POA: Diagnosis not present

## 2022-06-21 DIAGNOSIS — R1013 Epigastric pain: Secondary | ICD-10-CM | POA: Insufficient documentation

## 2022-06-21 LAB — CBC WITH DIFFERENTIAL/PLATELET
Abs Immature Granulocytes: 0.03 10*3/uL (ref 0.00–0.07)
Basophils Absolute: 0.1 10*3/uL (ref 0.0–0.1)
Basophils Relative: 1 %
Eosinophils Absolute: 0 10*3/uL (ref 0.0–0.5)
Eosinophils Relative: 0 %
HCT: 40.2 % (ref 36.0–46.0)
Hemoglobin: 13.3 g/dL (ref 12.0–15.0)
Immature Granulocytes: 0 %
Lymphocytes Relative: 31 %
Lymphs Abs: 2.8 10*3/uL (ref 0.7–4.0)
MCH: 28.7 pg (ref 26.0–34.0)
MCHC: 33.1 g/dL (ref 30.0–36.0)
MCV: 86.6 fL (ref 80.0–100.0)
Monocytes Absolute: 0.6 10*3/uL (ref 0.1–1.0)
Monocytes Relative: 6 %
Neutro Abs: 5.7 10*3/uL (ref 1.7–7.7)
Neutrophils Relative %: 62 %
Platelets: 459 10*3/uL — ABNORMAL HIGH (ref 150–400)
RBC: 4.64 MIL/uL (ref 3.87–5.11)
RDW: 13.2 % (ref 11.5–15.5)
WBC: 9.2 10*3/uL (ref 4.0–10.5)
nRBC: 0 % (ref 0.0–0.2)

## 2022-06-21 LAB — COMPREHENSIVE METABOLIC PANEL
ALT: 17 U/L (ref 0–44)
AST: 20 U/L (ref 15–41)
Albumin: 4 g/dL (ref 3.5–5.0)
Alkaline Phosphatase: 53 U/L (ref 38–126)
Anion gap: 10 (ref 5–15)
BUN: 14 mg/dL (ref 6–20)
CO2: 22 mmol/L (ref 22–32)
Calcium: 9.1 mg/dL (ref 8.9–10.3)
Chloride: 108 mmol/L (ref 98–111)
Creatinine, Ser: 1.03 mg/dL — ABNORMAL HIGH (ref 0.44–1.00)
GFR, Estimated: >60 mL/min (ref >60)
Glucose, Bld: 109 mg/dL — ABNORMAL HIGH (ref 70–99)
Potassium: 3.4 mmol/L — ABNORMAL LOW (ref 3.5–5.1)
Sodium: 140 mmol/L (ref 135–145)
Total Bilirubin: 0.7 mg/dL (ref 0.3–1.2)
Total Protein: 7.6 g/dL (ref 6.5–8.1)

## 2022-06-21 LAB — LIPASE, BLOOD: Lipase: 38 U/L (ref 11–51)

## 2022-06-21 LAB — CBG MONITORING, ED: Glucose-Capillary: 101 mg/dL — ABNORMAL HIGH (ref 70–99)

## 2022-06-21 MED ORDER — SODIUM CHLORIDE 0.9 % IV SOLN
25.0000 mg | Freq: Once | INTRAVENOUS | Status: AC
  Administered 2022-06-21: 25 mg via INTRAVENOUS
  Filled 2022-06-21: qty 1

## 2022-06-21 MED ORDER — ONDANSETRON HCL 4 MG/2ML IJ SOLN
4.0000 mg | Freq: Once | INTRAMUSCULAR | Status: AC
  Administered 2022-06-21: 4 mg via INTRAVENOUS

## 2022-06-21 MED ORDER — LACTATED RINGERS IV BOLUS
1000.0000 mL | Freq: Once | INTRAVENOUS | Status: AC
  Administered 2022-06-21: 1000 mL via INTRAVENOUS

## 2022-06-21 MED ORDER — METOCLOPRAMIDE HCL 5 MG/ML IJ SOLN
10.0000 mg | Freq: Once | INTRAMUSCULAR | Status: AC
  Administered 2022-06-21: 10 mg via INTRAMUSCULAR

## 2022-06-21 MED ORDER — METOCLOPRAMIDE HCL 5 MG/ML IJ SOLN
INTRAMUSCULAR | Status: AC
  Filled 2022-06-21: qty 2

## 2022-06-21 MED ORDER — DROPERIDOL 2.5 MG/ML IJ SOLN
2.5000 mg | Freq: Once | INTRAMUSCULAR | Status: AC
  Administered 2022-06-21: 2.5 mg via INTRAVENOUS
  Filled 2022-06-21: qty 2

## 2022-06-21 MED ORDER — PROMETHAZINE HCL 25 MG RE SUPP: 25.0000 mg | Freq: Four times a day (QID) | RECTAL | 0 refills | Status: DC | PRN

## 2022-06-21 MED ORDER — LIDOCAINE VISCOUS HCL 2 % MT SOLN
OROMUCOSAL | Status: AC
  Filled 2022-06-21: qty 15

## 2022-06-21 MED ORDER — ALUM & MAG HYDROXIDE-SIMETH 200-200-20 MG/5ML PO SUSP
ORAL | Status: AC
  Filled 2022-06-21: qty 30

## 2022-06-21 MED ORDER — ALUM & MAG HYDROXIDE-SIMETH 200-200-20 MG/5ML PO SUSP
30.0000 mL | Freq: Once | ORAL | Status: AC
  Administered 2022-06-21: 30 mL via ORAL

## 2022-06-21 MED ORDER — DIPHENHYDRAMINE HCL 50 MG/ML IJ SOLN
25.0000 mg | Freq: Once | INTRAMUSCULAR | Status: AC
  Administered 2022-06-21: 25 mg via INTRAVENOUS
  Filled 2022-06-21: qty 1

## 2022-06-21 MED ORDER — LIDOCAINE VISCOUS HCL 2 % MT SOLN
15.0000 mL | Freq: Once | OROMUCOSAL | Status: AC
  Administered 2022-06-21: 15 mL via OROMUCOSAL

## 2022-06-21 MED ORDER — ONDANSETRON HCL 4 MG/2ML IJ SOLN
INTRAMUSCULAR | Status: AC
  Filled 2022-06-21: qty 2

## 2022-06-21 NOTE — Telephone Encounter (Signed)
Attempted to call pt re: this morning's missed visit - unable to reach, notification that number is not in service at this time.

## 2022-06-21 NOTE — ED Provider Triage Note (Signed)
Emergency Medicine Provider Triage Evaluation Note  Anna Moses , a 47 y.o. female with a history of cyclical vomiting and marijuana use was evaluated in triage.  Pt complains of nausea and vomiting.  Review of Systems  Positive: Nausea and vomiting and diarrhea, dark stools, abdominal pain  Physical Exam  BP 108/75   Pulse 73   Temp 98.9 F (37.2 C)   Resp 19   Ht '5\' 1"'$  (1.549 m)   Wt 106.6 kg   SpO2 99%   BMI 44.40 kg/m  Gen:   Awake, wretching during exam, uncomfortable appearing  Resp:  Normal effort  MSK:   Moves extremities without difficulty  Other:  Abdominal exam limited due to the fact that she is sitting but has diffuse tenderness to palpation  Medical Decision Making  Medically screening exam initiated at 2:48 PM.  Appropriate orders placed.  Cherish Salice Knape was informed that the remainder of the evaluation will be completed by another provider, this initial triage assessment does not replace that evaluation, and the importance of remaining in the ED until their evaluation is complete.  Concerned about recurrence of patient's cannabinoid hyperemesis or cyclical vomiting syndrome given her symptoms.  Did have a CT scan on 06/17/2022 so we will hold off on repeat imaging at this time.  Was already given intramuscular Reglan and GI cocktail in clinic as well as IV Zofran but is having persistent nausea and vomiting.  Will order her for droperidol, Benadryl, and fluids.   Fransico Meadow, MD 06/21/22 1450

## 2022-06-21 NOTE — ED Provider Notes (Signed)
[facility] Provider Note   CSN: [id number] Arrival date & time: 06/21/22  1351     History  Chief Complaint  Patient presents with   Nausea   Emesis   Abdominal Pain    Anna Moses is a 47 y.o. female.  With a history of obesity, schizophrenia, anxiety, depression, asthma, H. pylori.  Who presents to the ED for evaluation of epigastric abdominal pain, nausea and vomiting.  Symptoms have been present for the past 3 weeks.  They have remained constant during that time.  She has been seen in the ED for this multiple times.  Symptoms are typically controlled in the ER and then come back when she goes home.  She denies alcohol use, smoking, recreational drug use.  States last time she used marijuana was greater than 1 month ago.  She has been struggling with constipation over the past month as well.  She is still passing gas.  She denies chest pain, shortness of breath, fevers, chills, urinary symptoms, pelvic pain.   Emesis Associated symptoms: abdominal pain   Abdominal Pain Associated symptoms: constipation, nausea and vomiting        Home Medications Prior to Admission medications   Medication Sig Start Date End Date Taking? Authorizing Provider  promethazine (PHENERGAN) 25 MG suppository Place 1 suppository (25 mg total) rectally every 6 (six) hours as needed for nausea or vomiting. 06/21/22  Yes Gennett Garcia, Grafton Folk, PA-C  albuterol (VENTOLIN HFA) 108 (90 Base) MCG/ACT inhaler Inhale 1-2 puffs into the lungs every 6 (six) hours as needed for wheezing or shortness of breath. 05/18/22   Talbot Grumbling, FNP  aspirin (ASPIRIN CHILDRENS) 81 MG chewable tablet Chew 1 tablet (81 mg total) by mouth 2 (two) times daily. For 6 weeks for DVT prophylaxis after surgery 06/02/22 07/14/22  Ethelda Chick, PA-C  doxycycline (VIBRA-TABS) 100 MG tablet Take 1 tablet (100 mg total) by mouth 2 (two) times daily. 06/14/22   Lilland, Alana, DO   famotidine (PEPCID) 20 MG tablet Take 1 tablet (20 mg total) by mouth 2 (two) times daily. 06/17/22   Fredia Sorrow, MD  omeprazole (PRILOSEC) 20 MG capsule Take 1 capsule (20 mg total) by mouth daily for 14 days. 06/17/22 07/01/22  Rising, Wells Guiles, PA-C  diphenhydrAMINE (BENADRYL) 25 MG tablet Take 1 tablet (25 mg total) by mouth every 6 (six) hours. Patient not taking: Reported on 12/12/2018 10/21/17 05/30/19  Charlesetta Shanks, MD  Ibuprofen-diphenhydrAMINE Cit (ADVIL PM) 200-38 MG TABS Take 2 tablets by mouth 2 (two) times daily as needed (headaches).  05/30/19  [provider]      Allergies    Patient has no known allergies.    Review of Systems   Review of Systems  Gastrointestinal:  Positive for abdominal pain, constipation, nausea and vomiting.  All other systems reviewed and are negative.   Physical Exam Updated Vital Signs BP (!) 190/79   Pulse (!) 54   Temp 99.6 F (37.6 C) (Oral)   Resp 20   Ht '5\' 1"'$  (1.549 m)   Wt 106.6 kg   SpO2 100%   BMI 44.40 kg/m  Physical Exam Vitals and nursing note reviewed.  Constitutional:      General: She is not in acute distress.    Appearance: She is well-developed.  HENT:     Head: Normocephalic and atraumatic.  Eyes:     Conjunctiva/sclera: Conjunctivae normal.  Cardiovascular:     Rate and Rhythm:  Normal rate and regular rhythm.     Heart sounds: No murmur heard. Pulmonary:     Effort: Pulmonary effort is normal. No respiratory distress.     Breath sounds: Normal breath sounds.  Abdominal:     Palpations: Abdomen is soft.     Tenderness: There is abdominal tenderness in the epigastric area. There is no guarding. Negative signs include Murphy's sign, McBurney's sign, psoas sign and obturator sign.  Musculoskeletal:        General: No swelling.     Cervical back: Neck supple.  Skin:    General: Skin is warm and dry.     Capillary Refill: Capillary refill takes less than 2 seconds.  Neurological:     Mental Status:  She is alert.  Psychiatric:        Mood and Affect: Mood normal.     ED Results / Procedures / Treatments   Labs (all labs ordered are listed, but only abnormal results are displayed) Labs Reviewed  COMPREHENSIVE METABOLIC PANEL - Abnormal; Notable for the following components:      Result Value   Potassium 3.4 (*)    Glucose, Bld 109 (*)    Creatinine, Ser 1.03 (*)    All other components within normal limits  CBC WITH DIFFERENTIAL/PLATELET - Abnormal; Notable for the following components:   Platelets 459 (*)    All other components within normal limits  CBG MONITORING, ED - Abnormal; Notable for the following components:   Glucose-Capillary 101 (*)    All other components within normal limits  LIPASE, BLOOD    EKG None  Radiology No results found.  Procedures Procedures    Medications Ordered in ED Medications  lactated ringers bolus 1,000 mL (0 mLs Intravenous Stopped 06/21/22 1838)  droperidol (INAPSINE) 2.5 MG/ML injection 2.5 mg (2.5 mg Intravenous Given 06/21/22 1500)  diphenhydrAMINE (BENADRYL) injection 25 mg (25 mg Intravenous Given 06/21/22 1458)  promethazine (PHENERGAN) 25 mg in sodium chloride 0.9 % 50 mL IVPB (0 mg Intravenous Stopped 06/21/22 1838)    ED Course/ Medical Decision Making/ A&P Clinical Course as of 06/21/22 1921  Tue Jun 21, 2022  1655 On reevaluation, patient's nausea and abdominal pain have improved.  Will p.o. challenge.  Discharged home with rectal Phenergan if successful [AS]  1727 Nausea returned after po trial. Will give phenergan and re trial [AS]    Clinical Course User Index [AS] Ellaina Schuler, Grafton Folk, PA-C                             Medical Decision Making Amount and/or Complexity of Data Reviewed Labs: ordered.  Risk Prescription drug management.  This patient presents to the ED for concern of abdominal pain, nausea, vomiting, this involves an extensive number of treatment options, and is a complaint that carries with  it a high risk of complications and morbidity.  The differential diagnosis for generalized abdominal pain includes, but is not limited to AAA, gastroenteritis, appendicitis, Bowel obstruction, Bowel perforation. Gastroparesis, DKA, Hernia, Inflammatory bowel disease, mesenteric ischemia, pancreatitis, peritonitis SBP, volvulus.  Co morbidities that complicate the patient evaluation  obesity, schizophrenia, anxiety, depression, asthma, H. pylori  My initial workup includes abdominal pain labs, antiemetic, fluids  Additional history obtained from: Nursing notes from this visit. Previous records within EMR system ED visits for similar on 06/17/2022, again on 06/17/2022, 06/18/2022  I ordered, reviewed and interpreted labs which include: CBC, CMP, lipase borderline hypokalemia of 3.4.  Labs otherwise reassuring  Afebrile, hemodynamically stable.  47 year old female presenting to the ED for evaluation of abdominal pain, nausea and vomiting.  She has been seen for this numerous times recently.  She has had ultrasounds and CT scans of her abdomen which were overall reassuring.  Symptoms were thought to be secondary to cannabis hyperemesis.  Physical exam is remarkable for epigastric abdominal TTP.  No peritoneal signs.  Lab work was overall reassuring.  No indication for repeat imaging today.  Was treated with Benadryl and droperidol initially and reported improvement in her symptoms.  She then developed nausea again after p.o. trial.  Was treated with Reglan and was able to tolerate fluids.  She will be sent a prescription for Reglan and encouraged to follow a clear liquid diet and slowly progress to normal diet.  She does report that she could not pick up the Zyprexa that was sent for her due to insurance not covering this.  She was given return precautions.  Stable at discharge.  At this time there does not appear to be any evidence of an acute emergency medical condition and the patient appears stable for  discharge with appropriate outpatient follow up. Diagnosis was discussed with patient who verbalizes understanding of care plan and is agreeable to discharge. I have discussed return precautions with patient who verbalizes understanding. Patient encouraged to follow-up with their PCP within 1 week. All questions answered.  Note: Portions of this report may have been transcribed using voice recognition software. Every effort was made to ensure accuracy; however, inadvertent computerized transcription errors may still be present.        Final Clinical Impression(s) / ED Diagnoses Final diagnoses:  Nausea and vomiting, unspecified vomiting type  Epigastric pain    Rx / DC Orders ED Discharge Orders          Ordered    promethazine (PHENERGAN) 25 MG suppository  Every 6 hours PRN        06/21/22 1908              Nehemiah Massed 06/21/22 1921    Jeanell Sparrow, DO 06/25/22 2129

## 2022-06-21 NOTE — ED Provider Notes (Signed)
[place]    CSN: [id number] Arrival date & time: 06/21/22  1151      History   Chief Complaint Chief Complaint  Patient presents with   Abdominal Pain    HPI Anna Moses is a 47 y.o. female.   Patient presents to urgent care for evaluation of cyclical nausea and vomiting with associated epigastric abdominal pain that initially started 1 week ago.  Patient has been seen multiple times in the emergency department over the last 7 days and was recently diagnosed with cyclical vomiting syndrome as well as hyperemesis cannabis.  Drug screen in the emergency department 4 days ago was positive for marijuana and opiates.  Patient received a full workup in the ED 4 days ago where she was discharged with Pepcid and Zofran to be used at home for symptoms.  She states over the last 4 days, she has not been able to keep any food down and her epigastric abdominal pain has become worse.  She has not had a fever or chills.  Currently nauseous and short of breath.  She is speaking in full sentences without difficulty.  Denies sore throat, cough, congestion, body aches, and dizziness.  Denies chest pain, heart palpitations, and blood/mucus to stool.  She denies smoking marijuana in the last few days.  States that she has been using Zofran at home and it has not been helping with her symptoms.  This is nonbloody/nonbilious.  Denies lower abdominal pain and diarrhea.   Abdominal Pain   Past Medical History:  Diagnosis Date   Asthma    Bronchitis    Depression    Obesity    Schizophrenia [facility])     Patient Active Problem List   Diagnosis Date Noted   Foreign body in left ear 10/08/2021   Hematuria 09/09/2021   Breast pain in female 08/29/2021   Muscle strain 08/13/2021   Lower back pain 07/14/2021   Contraception management 11/17/2020   Depression, recurrent ([place]) 11/17/2020   Effusion of left knee 08/15/2020   Cervical cancer screening 09/18/2019   Routine screening for  STI (sexually transmitted infection) [id number]   Cyclical vomiting [id number]   Schizophrenia ([place]) 07/24/2019   H. pylori infection 07/24/2019   Seasonal allergies 07/24/2019   Dysphagia 06/03/2019   Nausea and vomiting 06/03/2019   Liver lesion 06/03/2019   Preop examination 06/03/2019    Past Surgical History:  Procedure Laterality Date   CESAREAN SECTION     CHONDROPLASTY Left 06/02/2022   Procedure: CHONDROPLASTY;  Surgeon: Hiram Gash, MD;  Location: [place];  Service: Orthopedics;  Laterality: Left;   COLONOSCOPY  06/27/2019   KNEE ARTHROSCOPY WITH LATERAL RELEASE Left 06/02/2022   Procedure: KNEE ARTHROSCOPY WITH LATERAL RELEASE;  Surgeon: Hiram Gash, MD;  Location: [place];  Service: Orthopedics;  Laterality: Left;   KNEE ARTHROSCOPY WITH MEDIAL MENISECTOMY Left 06/02/2022   Procedure: KNEE ARTHROSCOPY WITH MEDIAL MENISECTOMY;  Surgeon: Hiram Gash, MD;  Location: [place];  Service: Orthopedics;  Laterality: Left;   UPPER GASTROINTESTINAL ENDOSCOPY  06/27/2019    OB History   No obstetric history on file.      Home Medications    Prior to Admission medications   Medication Sig Start Date End Date Taking? Authorizing Provider  albuterol (VENTOLIN HFA) 108 (90 Base) MCG/ACT inhaler Inhale 1-2 puffs into the lungs every 6 (six) hours as needed for wheezing or shortness of breath. 05/18/22   Joella Prince  M, FNP  aspirin (ASPIRIN CHILDRENS) 81 MG chewable tablet Chew 1 tablet (81 mg total) by mouth 2 (two) times daily. For 6 weeks for DVT prophylaxis after surgery 06/02/22 07/14/22  Ethelda Chick, PA-C  doxycycline (VIBRA-TABS) 100 MG tablet Take 1 tablet (100 mg total) by mouth 2 (two) times daily. 06/14/22   Lilland, Alana, DO  famotidine (PEPCID) 20 MG tablet Take 1 tablet (20 mg total) by mouth 2 (two) times daily. 06/17/22   Fredia Sorrow, MD  omeprazole (PRILOSEC) 20 MG capsule Take 1 capsule (20 mg  total) by mouth daily for 14 days. 06/17/22 07/01/22  Rising, Wells Guiles, PA-C  diphenhydrAMINE (BENADRYL) 25 MG tablet Take 1 tablet (25 mg total) by mouth every 6 (six) hours. Patient not taking: Reported on 12/12/2018 10/21/17 05/30/19  Charlesetta Shanks, MD  Ibuprofen-diphenhydrAMINE Cit (ADVIL PM) 200-38 MG TABS Take 2 tablets by mouth 2 (two) times daily as needed (headaches).  05/30/19  [provider]    Family History Family History  Problem Relation Age of Onset   Crohn's disease Mother    Liver disease Father    Clotting disorder Father    Liver disease Brother    Colon cancer Neg Hx    Esophageal cancer Neg Hx    Rectal cancer Neg Hx    Stomach cancer Neg Hx    Breast cancer Neg Hx     Social History Social History   Tobacco Use   Smoking status: Some Days    Packs/day: 0.50    Types: Cigarettes   Smokeless tobacco: Never  Vaping Use   Vaping Use: Never used  Substance Use Topics   Alcohol use: Yes     Allergies   Patient has no known allergies.   Review of Systems Review of Systems  Gastrointestinal:  Positive for abdominal pain.  Per HPI   Physical Exam Triage Vital Signs ED Triage Vitals  Enc Vitals Group     BP --      Pulse Rate 06/21/22 1223 69     Resp 06/21/22 1223 (!) 43     Temp --      Temp src --      SpO2 06/21/22 1223 100 %     Weight --      Height --      Head Circumference --      Peak Flow --      Pain Score 06/21/22 1222 10     Pain Loc --      Pain Edu? --      Excl. in [place]? --    No data found.  Updated Vital Signs BP (!) 166/83   Pulse 68   Temp 99.1 F (37.3 C)   Resp (!) 35   SpO2 99%   Visual Acuity Right Eye Distance:   Left Eye Distance:   Bilateral Distance:    Right Eye Near:   Left Eye Near:    Bilateral Near:     Physical Exam Vitals and nursing note reviewed.  Constitutional:      General: She is in acute distress.     Appearance: She is obese. She is ill-appearing. She is not  toxic-appearing.  HENT:     Head: Normocephalic and atraumatic.     Right Ear: Hearing and external ear normal.     Left Ear: Hearing and external ear normal.     Nose: Nose normal.     Mouth/Throat:     Lips: Pink.  Eyes:     General: Lids are normal. Vision grossly intact. Gaze aligned appropriately.     Extraocular Movements: Extraocular movements intact.     Conjunctiva/sclera: Conjunctivae normal.  Cardiovascular:     Rate and Rhythm: Normal rate.  Pulmonary:     Effort: Pulmonary effort is normal. Tachypnea present. No accessory muscle usage, prolonged expiration, respiratory distress or retractions.     Breath sounds: Normal breath sounds and air entry. No stridor or decreased air movement. No decreased breath sounds, wheezing, rhonchi or rales.     Comments: Speaking in full sentences without difficulty in between increased respirations. Abdominal:     General: Abdomen is flat and protuberant. Bowel sounds are normal. There is no distension.     Palpations: Abdomen is soft.     Tenderness: There is abdominal tenderness in the epigastric area.  Musculoskeletal:     Cervical back: Neck supple.  Skin:    General: Skin is warm and dry.     Capillary Refill: Capillary refill takes less than 2 seconds.     Findings: No rash.  Neurological:     General: No focal deficit present.     Mental Status: She is alert and oriented to person, place, and time. Mental status is at baseline.     Cranial Nerves: No dysarthria or facial asymmetry.  Psychiatric:        Mood and Affect: Mood normal.        Speech: Speech normal.        Behavior: Behavior normal.        Thought Content: Thought content normal.        Judgment: Judgment normal.      UC Treatments / Results  Labs (all labs ordered are listed, but only abnormal results are displayed) Labs Reviewed - No data to display  EKG   Radiology No results found.  Procedures Procedures (including critical care  time)  Medications Ordered in UC Medications  metoCLOPramide (REGLAN) injection 10 mg (10 mg Intramuscular Given 06/21/22 1245)  alum & mag hydroxide-simeth (MAALOX/MYLANTA) 200-200-20 MG/5ML suspension 30 mL (30 mLs Oral Given 06/21/22 1245)  lidocaine (XYLOCAINE) 2 % viscous mouth solution 15 mL (15 mLs Mouth/Throat Given 06/21/22 1245)  ondansetron (ZOFRAN) injection 4 mg (4 mg Intravenous Given 06/21/22 1331)    Initial Impression / Assessment and Plan / UC Course  I have reviewed the triage vital signs and the nursing notes.  Pertinent labs & imaging results that were available during my care of the patient were reviewed by me and considered in my medical decision making (see chart for details).   Cyclical vomiting with nausea Patient given 10 mg Reglan IM and GI cocktail in clinic.  She was unable to keep down the GI cocktail and ended up throwing this up shortly after administration.  Patient remains in acute distress on recheck and complains of worsening nausea/vomiting.  No peritoneal signs to abdominal exam.  Recommend transport to the nearest emergency department for another workup and further evaluation of cyclical vomiting likely due to excessive use of cannabis.  Patient is agreeable with plan.  She is requesting ambulance transport to the emergency department.  Currently called.  IV placed by nursing staff.  4 mg Zofran given IV prior to discharge from urgent care.  Vital signs are stable. Discussed risks of deferring ED visit, she is agreeable with plan.   Final Clinical Impressions(s) / UC Diagnoses   Final diagnoses:  Cyclical vomiting with nausea  Discharge Instructions   None    ED Prescriptions   None    PDMP not reviewed this encounter.   Talbot Grumbling, [place] 06/21/22 1337

## 2022-06-21 NOTE — ED Notes (Signed)
EDPA and family at [facility].  ?

## 2022-06-21 NOTE — ED Triage Notes (Signed)
Pt presents with complaints of abdominal pain. Reports the medication she has been given is not working. Pt is actively vomiting during intake and hyperventilating.

## 2022-06-21 NOTE — ED Notes (Signed)
Pt cleared to eat and drink. Provided drink and snack.

## 2022-06-21 NOTE — ED Notes (Signed)
Ambulatory to b/r, steady gait. Family at [facility].

## 2022-06-21 NOTE — ED Notes (Addendum)
EDPA at [facility], family at [facility]

## 2022-06-21 NOTE — Discharge Instructions (Addendum)
You have been seen today for your complaint of abdominal pain, nausea and vomiting. Your lab work was overall reassuring. Your discharge medications include Phenergan.  This is a nausea medicine.  It is a rectal suppository.  Take it as needed in order to eat and drink normal diet. Home care instructions are as follows:  Start with a clear liquid diet and slowly progress to a normal diet over the course of the next 4 days Follow up with: Your primary care doctor for reevaluation Please seek immediate medical care if you develop any of the following symptoms: Your pain does not go away as soon as your health care provider told you to expect. You cannot stop vomiting. Your pain is only in areas of the abdomen, such as the right side or the left lower portion of the abdomen. Pain on the right side could be caused by appendicitis. You have bloody or black stools, or stools that look like tar. You have severe pain, cramping, or bloating in your abdomen. You have signs of dehydration, such as: Dark urine, very little urine, or no urine. Cracked lips. Dry mouth. Sunken eyes. Sleepiness. Weakness. You have trouble breathing or chest pain. At this time there does not appear to be the presence of an emergent medical condition, however there is always the potential for conditions to change. Please read and follow the below instructions.  Do not take your medicine if  develop an itchy rash, swelling in your mouth or lips, or difficulty breathing; call 911 and seek immediate emergency medical attention if this occurs.  You may review your lab tests and imaging results in their entirety on your MyChart account.  Please discuss all results of fully with your primary care provider and other specialist at your follow-up visit.  Note: Portions of this text may have been transcribed using voice recognition software. Every effort was made to ensure accuracy; however, inadvertent computerized transcription  errors may still be present.

## 2022-06-21 NOTE — ED Notes (Signed)
Pt reports nausea has slightly improved. Pt is very drowsy at this time.

## 2022-06-21 NOTE — ED Notes (Signed)
Patient is being discharged from the Urgent Care and sent to the Emergency Department via EMS . Per Sharyn Lull NP, patient is in need of higher level of care due to severe abdominal pain, emesis. Patient is aware and verbalizes understanding of plan of care.  Vitals:   06/21/22 1223 06/21/22 1334  BP:  (!) 166/83  Pulse: 69 68  Resp: (!) 43 (!) 35  Temp:  99.1 F (37.3 C)  SpO2: 100% 99%

## 2022-06-21 NOTE — ED Notes (Signed)
Attempt to give report to [facility] with no answer x1

## 2022-06-21 NOTE — ED Triage Notes (Signed)
Pt arrives via [facility] from UC. Pt nausea, vomiting, and abdominal pain for over 1 week.

## 2022-06-22 NOTE — Therapy (Incomplete)
OUTPATIENT PHYSICAL THERAPY TREATMENT NOTE   Patient Name: Anna Moses MRN: [id number] DOB:January 31, 1976, 47 y.o., female Today's Date: 06/22/2022  PCP: Anna Bouche, MD  REFERRING PROVIDER: Hiram Gash, MD   END OF SESSION:     Past Medical History:  Diagnosis Date   Asthma    Bronchitis    Depression    Obesity    Schizophrenia [facility])    Past Surgical History:  Procedure Laterality Date   CESAREAN SECTION     CHONDROPLASTY Left 06/02/2022   Procedure: CHONDROPLASTY;  Surgeon: Anna Gash, MD;  Location: [place];  Service: Orthopedics;  Laterality: Left;   COLONOSCOPY  06/27/2019   KNEE ARTHROSCOPY WITH LATERAL RELEASE Left 06/02/2022   Procedure: KNEE ARTHROSCOPY WITH LATERAL RELEASE;  Surgeon: Anna Gash, MD;  Location: [place];  Service: Orthopedics;  Laterality: Left;   KNEE ARTHROSCOPY WITH MEDIAL MENISECTOMY Left 06/02/2022   Procedure: KNEE ARTHROSCOPY WITH MEDIAL MENISECTOMY;  Surgeon: Anna Gash, MD;  Location: [place];  Service: Orthopedics;  Laterality: Left;   UPPER GASTROINTESTINAL ENDOSCOPY  06/27/2019   Patient Active Problem List   Diagnosis Date Noted   Foreign body in left ear 10/08/2021   Hematuria 09/09/2021   Breast pain in female 08/29/2021   Muscle strain 08/13/2021   Lower back pain 07/14/2021   Contraception management 11/17/2020   Depression, recurrent ([place]) 11/17/2020   Effusion of left knee 08/15/2020   Cervical cancer screening 09/18/2019   Routine screening for STI (sexually transmitted infection) [id number]   Cyclical vomiting [id number]   Schizophrenia ([place]) 07/24/2019   H. pylori infection 07/24/2019   Seasonal allergies 07/24/2019   Dysphagia 06/03/2019   Nausea and vomiting 06/03/2019   Liver lesion 06/03/2019   Preop examination 06/03/2019    REFERRING DIAG: Left knee arthroscopy with lateral release and patellar chondroplasty Per referral can begin therapy  1-3 days after surgery  THERAPY DIAG:  No diagnosis found.  Rationale for Evaluation and Treatment Rehabilitation  PERTINENT HISTORY: hx schizophrenia/depression, s/p left knee arthroscopy with lateral release and patellar chondroplasty   PRECAUTIONS: Left knee arthroscopy with partial medial meniscectomy Left knee lateral release (Above per op note) Performed on 06/02/22  WEIGHT BEARING RESTRICTIONS: No (WBAT per op note)   SUBJECTIVE:                                                                                                                                                                                      SUBJECTIVE STATEMENT:  I have been using a brace with ice pack at home and that helps. I try to move and exercise but  it is touch to bend.  Pain today 4/10. Sometimes I use  my crutches but not today. ***   ***  PAIN:  Are you having pain: yes, 6/10 L knee Location/description: L knee Best-worst over past week: 6-10/10  Per eval -  - aggravating factors: WB, bending, walking/standing - Easing factors: medication   OBJECTIVE: (objective measures completed at initial evaluation unless otherwise dated)   DIAGNOSTIC FINDINGS: post op 06/02/22   PATIENT SURVEYS:  LEFS: 37/80   COGNITION: Overall cognitive status: Within functional limits for tasks assessed                         SENSATION: Denies any sensory complaints, unable to assess d/t dressing/wrap   EDEMA/INSPECTION:  Apparent edema about knee joint grossly WNL although formal assessment deferred due to dressing/wrap from knee joint to ankle, pt states she was instructed to leave on for at least three days    POSTURE: rounded shoulders, forward head, and increased thoracic kyphosis   PALPATION: Mild tightness as expected L quad   LOWER EXTREMITY ROM:   Active ROM Right eval Left eval LT  Hip flexion       Hip extension       Hip abduction       Hip adduction       Hip internal rotation       Hip  external rotation       Knee flexion 130 deg 50 deg 80 deg  Knee extension 0 ~0 (apparent, confounded by thick wrapping/dressing) 0 without dressings  Ankle dorsiflexion       Ankle plantarflexion       Ankle inversion       Ankle eversion        (Blank rows = not tested)   LOWER EXTREMITY MMT:     MMT Right eval Left eval  Hip flexion 5    Hip abduction (modified sitting) 5    Hip internal rotation      Hip external rotation      Knee flexion 5    Knee extension 5      (Blank rows = not tested) (Key: WFL = within functional limits not formally assessed, * = concordant pain, s = stiffness/stretching sensation, NT = not tested)  Comments:  surgical limb deferred given proximity to surgery       FUNCTIONAL TESTS:  Sit to stand transfer: B UE support, weight shift to R with anterior placement LLE, inc time and effort    GAIT: Distance walked: within clinic Assistive device utilized: None Level of assistance: Complete Independence Comments: significant antalgic gait, reduced stance on L, trunk lean to R, reduced trunk rotation/arm swing. No LOB but appears mildly unsteady.      TODAY'S TREATMENT:   [facility] Adult PT Treatment:                                                DATE: 06/23/22 Therapeutic Exercise: *** Manual Therapy: *** Neuromuscular re-ed: *** Therapeutic Activity: *** Modalities: *** Self Care: [name]  DATE: 06-14-22 Therapeutic Exercise: Quad set 1 x 20, 1 x 15 with cues, positioning and towel for sensory input for appropriated execution SLR LT 3 x 10 with VC for DF and abd setting before SLR SAQ 3 x 10 VC for pace Hip abduction LT in RT sidelying with VC and TC for correct biomechanics 3 x 10 Hip extension LT in RT sidelying with VC and TC for correct biomechanics  3 x 10 Seated Knee flexion AAROM x10 LT LE cues for positioning, using washcloth on floor to decrease friction  Manual  Therapy: Retrograde massage to decrease knee edema                                                                                                                         [name]                                                DATE: 06/03/22 Therapeutic Exercise: Quad set x10 RLE cues for form, positioning, and appropriate performance Knee flexion AAROM x10 RLE cues for positioning, appropriate ROM, and HEP HEP handout, education and review      PATIENT EDUCATION:  Education details: Pt education on PT impairments, prognosis, and POC. Informed consent. Rationale for interventions, safe/appropriate HEP performance. Monitoring symptoms, icing as appropriate, surgical healing, AD use for safety Person educated: Patient Education method: Explanation, Demonstration, Tactile cues, Verbal cues, and Handouts Education comprehension: verbalized understanding, returned demonstration, verbal cues required, tactile cues required, and needs further education     HOME EXERCISE PROGRAM: Access Code: [email or web address] URL: [email or web address] Date: 06/14/2022 Prepared by: Voncille Lo  Exercises - Supine Quad Set  - 1 x daily - 7 x weekly - 3 sets - 10 reps - Seated Knee Flexion AAROM  - 1 x daily - 7 x weekly - 3 sets - 10 reps - Active Straight Leg Raise with Quad Set  - 1 x daily - 7 x weekly - 3 sets - 10 reps - Seated Long Arc Quad  - 1 x daily - 7 x weekly - 3 sets - 10 reps - Sidelying Hip Abduction  - 1 x daily - 7 x weekly - 3 sets - 10 reps ASSESSMENT:   CLINICAL IMPRESSION:  ***  *** Anna Moses returns without AD and 4/10 and was reduced to 0/10 after exercise.  Pt gained in LT knee flexion to 80 degrees today. Pt HEP updated for additional exercise to encourage knee flexion as pt tolerates.  Anna Moses also received retrograde massage to reduce edema and pain.  No adverse events, denies any increase in pain on departure. Recommend skilled PT to address post  surgical deficits and maximize functional tolerance. Pt departs today's session in no acute distress, all voiced questions/concerns addressed appropriately from PT perspective    EVAL- Patient is a  pleasant 47 y.o. woman who was seen today for physical therapy evaluation and treatment for L knee pain s/p arthroscopic partial medial meniscectomy with lateral release and patellar chondroplasty on 06/02/22. Per referral, can begin therapy 1-3 days after surgery. Pt arrives w/ 6/10 pain, ROM deficits as expected POD1. Deferred MMT given proximity to surgery, gait impairments as above. No apparent indication of complications at this point, educated pt on monitoring for adverse symptoms/events, she verbalizes understanding. Also encouraged use of axillary crutches for now due to gait impairments as above and mild unsteadiness, pt verbalizes agreement. HEP tolerated well as above, handout provided. No adverse events, denies any increase in pain on departure. Recommend skilled PT to address post surgical deficits and maximize functional tolerance. Pt departs today's session in no acute distress, all voiced questions/concerns addressed appropriately from PT perspective.     OBJECTIVE IMPAIRMENTS: Abnormal gait, decreased activity tolerance, decreased balance, decreased endurance, decreased mobility, difficulty walking, decreased ROM, decreased strength, increased edema, impaired perceived functional ability, improper body mechanics, and pain.    ACTIVITY LIMITATIONS: carrying, lifting, bending, standing, squatting, stairs, transfers, bed mobility, and locomotion level   PARTICIPATION LIMITATIONS: meal prep, cleaning, laundry, and community activity   PERSONAL FACTORS: Time since onset of injury/illness/exacerbation and 1 comorbidity: depression  are also affecting patient's functional outcome.    REHAB POTENTIAL: Good   CLINICAL DECISION MAKING: Evolving/moderate complexity   EVALUATION COMPLEXITY: Low      GOALS: Goals reviewed with patient? No   SHORT TERM GOALS: Target date: 07/08/2022 Pt will demonstrate appropriate understanding and performance of initially prescribed HEP in order to facilitate improved independence with management of symptoms.  Baseline: HEP provided on eval Goal status: INITIAL    2. Pt will score greater than or equal to 48 on LEFS in order to demonstrate improved perception of function due to symptoms.            Baseline: 37            Goal status: INITIAL     LONG TERM GOALS: Target date: 08/12/2022    Pt will score 55 or greater on LEFS in order to demonstrate improved perception of function due to symptoms.  Baseline: 37/80 Goal status: INITIAL   2.  Pt will demonstrate at least 0-110 degrees of L knee AROM in order to facilitate improved tolerance to functional movements such as bending/squatting.  Baseline: see ROM chart above Goal status: INITIAL   3.  Pt will be able to lift up to 25# with less than 2 pt increase in resting pain on NPS in order to demonstrate improved capacity for daily activities such as housework and interacting with grandchildren.  Baseline: NT due to proximity to surgery Goal status: INITIAL   4.  Pt will be able to perform sit to stand transfer without UE support and grossly appropriate mechanics in order to maximize safety w/ transfers. Baseline: see above - altered mechanics and requires B UE support Goal status: INITIAL    5. Pt will be able to safely navigate up to 18 stairs with unilat UE support in order to maximize safety w/ home navigation and reduce fall risk            Baseline: inc time/effort and difficulty reported with stair navigation at home            Goal status: INITIAL      PLAN:   PT FREQUENCY: 2x/week   PT DURATION: 10 weeks   PLANNED INTERVENTIONS:  Therapeutic exercises, Therapeutic activity, Neuromuscular re-education, Balance training, Gait training, Patient/Family education, Self Care, Joint  mobilization, Stair training, DME instructions, Aquatic Therapy, Dry Needling, Electrical stimulation, Cryotherapy, Moist heat, scar mobilization, Taping, Manual therapy, and Re-evaluation   PLAN FOR NEXT SESSION: Emphasis on quad activation and knee flexion. Review/update HEP PRN ***       Leeroy Cha PT, DPT 06/22/2022 8:42 AM

## 2022-06-23 ENCOUNTER — Telehealth: Payer: Self-pay | Admitting: Physical Therapy

## 2022-06-23 ENCOUNTER — Ambulatory Visit: Payer: Medicaid Other | Admitting: Physical Therapy

## 2022-06-23 ENCOUNTER — Encounter: Payer: Self-pay | Admitting: Physical Therapy

## 2022-06-23 NOTE — Telephone Encounter (Signed)
Attempted to call pt, number was out of service.

## 2022-06-28 ENCOUNTER — Ambulatory Visit: Payer: Medicaid Other | Admitting: Physical Therapy

## 2022-07-01 ENCOUNTER — Ambulatory Visit: Payer: Medicaid Other | Admitting: Physical Therapy

## 2022-07-05 ENCOUNTER — Encounter: Payer: Medicaid Other | Admitting: Physical Therapy

## 2022-07-07 ENCOUNTER — Ambulatory Visit: Payer: Medicaid Other | Admitting: Physical Therapy

## 2022-07-12 ENCOUNTER — Encounter: Payer: Medicaid Other | Admitting: Physical Therapy

## 2022-07-14 ENCOUNTER — Encounter: Payer: Medicaid Other | Admitting: Physical Therapy

## 2022-07-26 ENCOUNTER — Ambulatory Visit: Payer: Medicaid Other | Admitting: Gastroenterology

## 2022-08-23 ENCOUNTER — Ambulatory Visit: Payer: Medicaid Other

## 2022-08-23 ENCOUNTER — Ambulatory Visit (INDEPENDENT_AMBULATORY_CARE_PROVIDER_SITE_OTHER): Payer: Medicaid Other

## 2022-08-23 DIAGNOSIS — Z30013 Encounter for initial prescription of injectable contraceptive: Secondary | ICD-10-CM | POA: Diagnosis present

## 2022-08-23 MED ORDER — MEDROXYPROGESTERONE ACETATE 150 MG/ML IM SUSY
150.0000 mg | PREFILLED_SYRINGE | Freq: Once | INTRAMUSCULAR | Status: AC
  Administered 2022-08-23: 150 mg via INTRAMUSCULAR

## 2022-08-23 NOTE — Progress Notes (Signed)
Patient here today for Depo Provera injection and is within her dates.     Last contraceptive appt was 03/15/2022.   Depo given in LD today. Site unremarkable & patient tolerated injection.     Next injection due 11/08/2022-11/22/2022.  Reminder card given.

## 2022-09-06 ENCOUNTER — Other Ambulatory Visit: Payer: Self-pay

## 2022-09-06 NOTE — Telephone Encounter (Signed)
Patient LVM on nurse line requesting abx for breast abscess.   Attempted to return call to patient. She did not answer, LVM asking patient to return call to discuss request further.   Veronda Prude, RN

## 2022-09-06 NOTE — Telephone Encounter (Signed)
Patient returns call to nurse line in regards to breast abscess.   She is requesting an antibiotic. Patient advised she would need be evaluated first.   Patient denies redness and swelling on her left breast. She denies any drainage, fevers or chills.   Patient scheduled for Thursday.   Precautions discussed.

## 2022-09-08 ENCOUNTER — Ambulatory Visit (INDEPENDENT_AMBULATORY_CARE_PROVIDER_SITE_OTHER): Payer: Medicaid Other | Admitting: Family Medicine

## 2022-09-08 ENCOUNTER — Encounter: Payer: Self-pay | Admitting: Family Medicine

## 2022-09-08 VITALS — BP 118/70 | HR 95 | Wt 240.0 lb

## 2022-09-08 DIAGNOSIS — N611 Abscess of the breast and nipple: Secondary | ICD-10-CM

## 2022-09-08 DIAGNOSIS — F339 Major depressive disorder, recurrent, unspecified: Secondary | ICD-10-CM

## 2022-09-08 MED ORDER — DOXYCYCLINE HYCLATE 100 MG PO TABS: 100.0000 mg | ORAL_TABLET | Freq: Two times a day (BID) | ORAL | 0 refills | Status: DC

## 2022-09-08 NOTE — Assessment & Plan Note (Signed)
PHQ-9 score of 14 with a positive #9.  Reassuring without SI/HI and has protective factors of her children and grandchildren.  Current issues seem to be related to significant other's anger issues and patient's concern for her own escalation in response to him.  She is interested in counseling resources at this time. - Therapy list provided, patient states she is going to call offices when she gets home - Discussed return precautions - Patient given information for crisis centers and suicide hotline

## 2022-09-08 NOTE — Progress Notes (Signed)
    SUBJECTIVE:   CHIEF COMPLAINT / HPI:   Breast Abscess - Swelling started about 1 week ago and became painful 3 days ago, has not had drainage - Patient referred to [place] [facility] at the last visit for this problem in March - States the the surgeon told her he didn't recommend surgery but she needed to stop smoking. She said he mentioned she could lose feeling in the breast and she wants to try conservative management  - Treated previously with doxycyline - Patient reports that it has persistent with the pain since treatment - Now feels warm and more tender to the patient - Previously imaged in July 2023, has mammogram scheduled  Depression - Notes depression worsening with significant other having anger management issues - Patient is not afraid of her significant other, she is concerned about her escalations to him - Does not have any active SI/HI or intention for arm  - Has protective factors of her children and grandchildren  PERTINENT  PMH / PSH: Reviewed  OBJECTIVE:   BP 118/70   Pulse 95   Wt 240 lb (108.9 kg)   SpO2 97%   BMI 45.35 kg/m   General: NAD, well-appearing, well-nourished Respiratory: No respiratory distress, breathing comfortably, able to speak in full sentences Skin: warm and dry Breast: left breast with area of painful fluctuance in the retro-areolar region that is slightly erythematous and warm. Exam chaperoned by Cleatrice Burke, CMA  ASSESSMENT/PLAN:   Left breast abscess Known breast abscess in the left retroareolar region with swelling that started 1 week ago and increased pain starting 3 days ago.  Concern for active infection.  Last treated with doxycycline in March 2024.  Has seen general surgery for evaluation and the recommendation was smoking cessation without intervention surgically at this time. - Doxycycline 100 mg twice daily x 10 days - Return precautions given - Has mammogram scheduled for next week, may need to consider breast  ultrasound reimaging 1 to 2 weeks after antibiotic course and symptom resolution  Depression, recurrent (H[facility]) PHQ-9 score of 14 with a positive #9.  Reassuring without SI/HI and has protective factors of her children and grandchildren.  Current issues seem to be related to significant other's anger issues and patient's concern for her own escalation in response to him.  She is interested in counseling resources at this time. - Therapy list provided, patient states she is going to call offices when she gets home - Discussed return precautions - Patient given information for crisis centers and suicide hotline     Evelena Leyden, DO [facility]

## 2022-09-08 NOTE — Assessment & Plan Note (Signed)
Known breast abscess in the left retroareolar region with swelling that started 1 week ago and increased pain starting 3 days ago.  Concern for active infection.  Last treated with doxycycline in March 2024.  Has seen general surgery for evaluation and the recommendation was smoking cessation without intervention surgically at this time. - Doxycycline 100 mg twice daily x 10 days - Return precautions given - Has mammogram scheduled for next week, may need to consider breast ultrasound reimaging 1 to 2 weeks after antibiotic course and symptom resolution

## 2022-09-08 NOTE — Patient Instructions (Signed)
I am going to send in another round of antibiotics for you to take twice daily for the next 10 days.  Please make sure to take it with food as it can cause upset stomach if not.  You are doing great with decreasing the amount you are smoking, after reading the notes from the surgeon it sounds like a lot of people will have some improvement/resolution of these types of abscesses without intervention.  Please make sure to go to the scheduled mammogram that you have.  If it is not scheduled out for a few months, call our office about a week after you finish the antibiotics and your swelling gets better and we may need to consider getting an ultrasound at that time.     Therapy and Counseling Resources Most providers on this list will take Medicaid. Patients with commercial insurance or Medicare should contact their insurance company to get a list of in network providers.  [facility] (takes children) Location 1: [address] [place], [place] [address] Location 2: [address] [place], [place] [address] [phone]   [facility] (spanish speaking therapist available)(habla espanol)(take medicare and medicaid)  [address], [place], [place] [address], [email or web address] [email or web address] [phone]  [facility] [address] [facility] [place], [place] [address] [phone]  [facility]   [address] Suite [place], [place] [address]      [phone]  [facility] (spanish available) [address]  [place], [place] [address] [phone]  [facility] (take [facility] and medicare) [address], [address]  [place], [place] [address]       [phone]     [facility] (virtual only) [phone]  [facility] [address] [place], [place] [phone]  [facility]:  [address] [place] [phone]  [facility]:  [address] [place] [phone]  [facility] (under & uninsured) [address]   [place] [phone]    [email or web address]    [facility] [address]  [place]    [phone]  [facility] -[address]     Phone:  [phone]     [facility]-  [address]  [phone]   [facility] [address]      [place], [place] [phone]  [facility]: [address] [place], [place], [place]  [phone]   [facility] (Spanish therapist) [email or web address]  [address] [place]  [address]   [place] [address]    [phone]    [facility]   [address],  [place], [place]  [phone]   [facility]  [address] [place]  [phone]  [facility]  [address] [place], [place]        [phone]  Open Access/Walk In Clinic under & uninsured  [facility]  [address] [place], [place] [phone] Crisis [phone]  [facility] [place],  (Spanish)   [address], [place]: [phone]) 8:30 - 12; 1 - 2:30  [facility] [employer],  [address] [place], [place]    ([phone]):8:30 - 12; 2 - 3PM  [facility] [employer],  [address]  [place]; [phone]):   Mon - Fri 8  AM - 5 PM  [facility] [address] [place]  MWF 12:30 to 3:00 or call to schedule an appointment  [phone]  Specific Provider options Psychology Today  [email or web address] click on find a therapist  enter your zip code left side and select or tailor a therapist for your specific need.   [facility] Provider Directory [email or web address]  (Medicaid)   Follow all drop down to find a provider  Social Support  program [facility] [place] [phone] or PhotoS[employer].pl [address], [place], [place] Recovery support and educational   24- Hour Availability:   [facility]  [address] [place], [place] [phone] Crisis [phone]  [facility] [phone]  [facility]  [phone]   [facility]  [phone] (after hours)  Therapeutic Alternative/Mobile Crisis   [phone]  [place] National Suicide Hotline  [phone] Len Childs)  Call 911 or go to emergency room  [facility]  [phone]);  [place] and [employer]  [phone]); [place], [place], [place], [place], [place], [place], [place]

## 2022-09-14 ENCOUNTER — Other Ambulatory Visit: Payer: Self-pay | Admitting: Family Medicine

## 2022-09-14 DIAGNOSIS — Z1231 Encounter for screening mammogram for malignant neoplasm of breast: Secondary | ICD-10-CM

## 2022-09-15 ENCOUNTER — Ambulatory Visit
Admission: RE | Admit: 2022-09-15 | Discharge: 2022-09-15 | Disposition: A | Discharge status: Home / Self Care | Payer: Medicaid Other | Source: Ambulatory Visit | Attending: Family Medicine | Admitting: Family Medicine

## 2022-09-15 DIAGNOSIS — Z1231 Encounter for screening mammogram for malignant neoplasm of breast: Secondary | ICD-10-CM

## 2022-09-20 ENCOUNTER — Other Ambulatory Visit: Payer: Self-pay | Admitting: Family Medicine

## 2022-09-20 DIAGNOSIS — R928 Other abnormal and inconclusive findings on diagnostic imaging of breast: Secondary | ICD-10-CM

## 2022-10-12 ENCOUNTER — Ambulatory Visit
Admission: RE | Admit: 2022-10-12 | Discharge: 2022-10-12 | Disposition: A | Discharge status: Home / Self Care | Payer: Medicaid Other | Source: Ambulatory Visit | Attending: Family Medicine | Admitting: Family Medicine

## 2022-10-12 ENCOUNTER — Ambulatory Visit
Admission: RE | Admit: 2022-10-12 | Discharge: 2022-10-12 | Disposition: A | Discharge status: Home / Self Care | Payer: MEDICAID | Source: Ambulatory Visit | Attending: Family Medicine

## 2022-10-12 DIAGNOSIS — R928 Other abnormal and inconclusive findings on diagnostic imaging of breast: Secondary | ICD-10-CM

## 2022-10-24 ENCOUNTER — Ambulatory Visit: Payer: Self-pay | Admitting: Surgery

## 2022-10-25 ENCOUNTER — Ambulatory Visit (INDEPENDENT_AMBULATORY_CARE_PROVIDER_SITE_OTHER): Payer: MEDICAID | Admitting: Mental Health

## 2022-10-25 DIAGNOSIS — F251 Schizoaffective disorder, depressive type: Secondary | ICD-10-CM

## 2022-10-25 DIAGNOSIS — F411 Generalized anxiety disorder: Secondary | ICD-10-CM

## 2022-10-25 DIAGNOSIS — F1290 Cannabis use, unspecified, uncomplicated: Secondary | ICD-10-CM
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
## 2022-11-03 ENCOUNTER — Encounter (HOSPITAL_COMMUNITY): Payer: Self-pay

## 2022-11-03 ENCOUNTER — Ambulatory Visit (HOSPITAL_COMMUNITY): Payer: MEDICAID | Admitting: Mental Health

## 2022-11-03 DIAGNOSIS — F251 Schizoaffective disorder, depressive type: Secondary | ICD-10-CM

## 2022-11-03 DIAGNOSIS — F411 Generalized anxiety disorder: Secondary | ICD-10-CM

## 2022-11-03 DIAGNOSIS — F1290 Cannabis use, unspecified, uncomplicated: Secondary | ICD-10-CM
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
## 2022-11-09 ENCOUNTER — Other Ambulatory Visit: Payer: Self-pay

## 2022-11-09 ENCOUNTER — Ambulatory Visit: Payer: MEDICAID

## 2022-11-09 ENCOUNTER — Encounter (HOSPITAL_BASED_OUTPATIENT_CLINIC_OR_DEPARTMENT_OTHER): Payer: Self-pay | Admitting: Surgery

## 2022-11-09 DIAGNOSIS — Z30013 Encounter for initial prescription of injectable contraceptive: Secondary | ICD-10-CM | POA: Diagnosis not present

## 2022-11-09 MED ORDER — MEDROXYPROGESTERONE ACETATE 150 MG/ML IM SUSY
150.0000 mg | PREFILLED_SYRINGE | Freq: Once | INTRAMUSCULAR | Status: AC
  Administered 2022-11-09: 150 mg via INTRAMUSCULAR

## 2022-11-09 NOTE — Progress Notes (Signed)
Patient here today for Depo Provera injection and is within her dates.  Patient reports spotting, starting around the beginning of July that has gotten heavier in the last few weeks.  She states that around the middle of July she started experiencing blood clots. Using about 5-6 super tampons per day. Denies lightheadedness or dizziness.   Spoke with Dr. McDiarmid. Advised that patient could receive depo today and to follow up with provider appointment to address bleeding concern. Scheduled patient for tomorrow with Dr. Rexene Alberts at 10:15.  Last contraceptive appt was 03/15/2022.  Depo given in RD today, per patient request.  Site unremarkable & patient tolerated injection.    Next injection due 01/25/2023-02/08/23.  Reminder card given.    Veronda Prude, RN

## 2022-11-10 ENCOUNTER — Ambulatory Visit: Payer: MEDICAID | Admitting: Family Medicine

## 2022-11-10 NOTE — Progress Notes (Deleted)
    SUBJECTIVE:   CHIEF COMPLAINT / HPI:   Pt presenting due to increased vaginal bleeding with the depo injection. States that around the middle of July she began passing blood clots. She is using 5-6 super tampons per day.  Next depo injection due 10/16-10/30\  Pap/HPV 09/18/19 - normal  PERTINENT  PMH / PSH: ***  OBJECTIVE:   There were no vitals taken for this visit.  ***  ASSESSMENT/PLAN:   No problem-specific Assessment & Plan notes found for this encounter.     Para March, DO [facility]

## 2022-11-17 ENCOUNTER — Other Ambulatory Visit: Payer: Self-pay

## 2022-11-17 ENCOUNTER — Encounter (HOSPITAL_BASED_OUTPATIENT_CLINIC_OR_DEPARTMENT_OTHER)
Admission: RE | Disposition: A | Discharge status: Home / Self Care | Payer: Self-pay | Source: Home / Self Care | Attending: Surgery

## 2022-11-17 ENCOUNTER — Ambulatory Visit (HOSPITAL_BASED_OUTPATIENT_CLINIC_OR_DEPARTMENT_OTHER): Payer: MEDICAID | Admitting: Anesthesiology

## 2022-11-17 ENCOUNTER — Encounter (HOSPITAL_BASED_OUTPATIENT_CLINIC_OR_DEPARTMENT_OTHER): Payer: Self-pay | Admitting: Surgery

## 2022-11-17 ENCOUNTER — Ambulatory Visit (HOSPITAL_BASED_OUTPATIENT_CLINIC_OR_DEPARTMENT_OTHER)
Admission: RE | Admit: 2022-11-17 | Discharge: 2022-11-17 | Disposition: A | Discharge status: Home / Self Care | Payer: MEDICAID | Attending: Surgery | Admitting: Surgery

## 2022-11-17 DIAGNOSIS — F17210 Nicotine dependence, cigarettes, uncomplicated: Secondary | ICD-10-CM | POA: Insufficient documentation

## 2022-11-17 DIAGNOSIS — F339 Major depressive disorder, recurrent, unspecified: Secondary | ICD-10-CM | POA: Diagnosis not present

## 2022-11-17 DIAGNOSIS — Z6841 Body Mass Index (BMI) 40.0 and over, adult: Secondary | ICD-10-CM | POA: Diagnosis not present

## 2022-11-17 DIAGNOSIS — Z01818 Encounter for other preprocedural examination: Secondary | ICD-10-CM

## 2022-11-17 DIAGNOSIS — N611 Abscess of the breast and nipple: Secondary | ICD-10-CM | POA: Diagnosis not present

## 2022-11-17 DIAGNOSIS — N6022 Fibroadenosis of left breast: Secondary | ICD-10-CM | POA: Insufficient documentation

## 2022-11-17 DIAGNOSIS — J45909 Unspecified asthma, uncomplicated: Secondary | ICD-10-CM | POA: Diagnosis not present

## 2022-11-17 HISTORY — PX: BREAST DUCTAL SYSTEM EXCISION: SHX5242

## 2022-11-17 LAB — POCT PREGNANCY, URINE: Preg Test, Ur: NEGATIVE

## 2022-11-17 SURGERY — EXCISION DUCTAL SYSTEM BREAST
Anesthesia: General | Site: Breast | Laterality: Left

## 2022-11-17 MED ORDER — FENTANYL CITRATE (PF) 100 MCG/2ML IJ SOLN
INTRAMUSCULAR | Status: AC
  Filled 2022-11-17: qty 2

## 2022-11-17 MED ORDER — OXYCODONE HCL 5 MG PO TABS: 5.0000 mg | ORAL_TABLET | Freq: Four times a day (QID) | ORAL | 0 refills | Status: DC | PRN

## 2022-11-17 MED ORDER — OXYCODONE HCL 5 MG PO TABS
ORAL_TABLET | ORAL | Status: AC
  Filled 2022-11-17: qty 1

## 2022-11-17 MED ORDER — ACETAMINOPHEN 500 MG PO TABS: 1000.0000 mg | ORAL_TABLET | Freq: Once | ORAL | Status: DC

## 2022-11-17 MED ORDER — PROPOFOL 10 MG/ML IV BOLUS
INTRAVENOUS | Status: AC
  Filled 2022-11-17: qty 20

## 2022-11-17 MED ORDER — SODIUM CHLORIDE 0.9 % IV SOLN
INTRAVENOUS | Status: DC | PRN
  Administered 2022-11-17: 500 mL

## 2022-11-17 MED ORDER — PROPOFOL 10 MG/ML IV BOLUS
INTRAVENOUS | Status: DC | PRN
Start: 2022-11-17 — End: 2022-11-17
  Administered 2022-11-17: 200 mg via INTRAVENOUS

## 2022-11-17 MED ORDER — DEXAMETHASONE SODIUM PHOSPHATE 4 MG/ML IJ SOLN
INTRAMUSCULAR | Status: DC | PRN
  Administered 2022-11-17: 5 mg via INTRAVENOUS

## 2022-11-17 MED ORDER — MIDAZOLAM HCL 5 MG/5ML IJ SOLN
INTRAMUSCULAR | Status: DC | PRN
  Administered 2022-11-17: 2 mg via INTRAVENOUS

## 2022-11-17 MED ORDER — CEFAZOLIN SODIUM-DEXTROSE 2-4 GM/100ML-% IV SOLN
INTRAVENOUS | Status: AC
  Filled 2022-11-17: qty 100

## 2022-11-17 MED ORDER — BUPIVACAINE-EPINEPHRINE 0.25% -1:200000 IJ SOLN
INTRAMUSCULAR | Status: DC | PRN
  Administered 2022-11-17: 19 mL

## 2022-11-17 MED ORDER — ACETAMINOPHEN 500 MG PO TABS
ORAL_TABLET | ORAL | Status: AC
  Filled 2022-11-17: qty 2

## 2022-11-17 MED ORDER — DEXAMETHASONE SODIUM PHOSPHATE 10 MG/ML IJ SOLN
INTRAMUSCULAR | Status: AC
  Filled 2022-11-17: qty 1

## 2022-11-17 MED ORDER — FENTANYL CITRATE (PF) 100 MCG/2ML IJ SOLN
INTRAMUSCULAR | Status: DC | PRN
  Administered 2022-11-17 (×8): 25 ug via INTRAVENOUS

## 2022-11-17 MED ORDER — LACTATED RINGERS IV SOLN: INTRAVENOUS | Status: DC

## 2022-11-17 MED ORDER — CHLORHEXIDINE GLUCONATE CLOTH 2 % EX PADS: 6.0000 | MEDICATED_PAD | Freq: Once | CUTANEOUS | Status: DC

## 2022-11-17 MED ORDER — SODIUM CHLORIDE 0.9 % IV SOLN
INTRAVENOUS | Status: AC
  Filled 2022-11-17: qty 10

## 2022-11-17 MED ORDER — ACETAMINOPHEN 500 MG PO TABS: 1000.0000 mg | ORAL_TABLET | ORAL | Status: DC

## 2022-11-17 MED ORDER — ONDANSETRON HCL 4 MG/2ML IJ SOLN
INTRAMUSCULAR | Status: DC | PRN
  Administered 2022-11-17: 4 mg via INTRAVENOUS

## 2022-11-17 MED ORDER — LIDOCAINE 2% (20 MG/ML) 5 ML SYRINGE
INTRAMUSCULAR | Status: AC
  Filled 2022-11-17: qty 5

## 2022-11-17 MED ORDER — OXYCODONE HCL 5 MG PO TABS
5.0000 mg | ORAL_TABLET | Freq: Once | ORAL | Status: AC
  Administered 2022-11-17: 5 mg via ORAL

## 2022-11-17 MED ORDER — GABAPENTIN 300 MG PO CAPS
ORAL_CAPSULE | ORAL | Status: AC
  Filled 2022-11-17: qty 1

## 2022-11-17 MED ORDER — ONDANSETRON HCL 4 MG/2ML IJ SOLN
INTRAMUSCULAR | Status: AC
  Filled 2022-11-17: qty 2

## 2022-11-17 MED ORDER — CEFAZOLIN IN SODIUM CHLORIDE 3-0.9 GM/100ML-% IV SOLN
3.0000 g | INTRAVENOUS | Status: AC
  Administered 2022-11-17: 2 g via INTRAVENOUS

## 2022-11-17 MED ORDER — FENTANYL CITRATE (PF) 100 MCG/2ML IJ SOLN
25.0000 ug | INTRAMUSCULAR | Status: DC | PRN
  Administered 2022-11-17: 50 ug via INTRAVENOUS

## 2022-11-17 MED ORDER — MIDAZOLAM HCL 2 MG/2ML IJ SOLN
INTRAMUSCULAR | Status: AC
  Filled 2022-11-17: qty 2

## 2022-11-17 MED ORDER — GABAPENTIN 300 MG PO CAPS
300.0000 mg | ORAL_CAPSULE | ORAL | Status: AC
  Administered 2022-11-17: 300 mg via ORAL

## 2022-11-17 MED ORDER — LIDOCAINE 2% (20 MG/ML) 5 ML SYRINGE
INTRAMUSCULAR | Status: DC | PRN
  Administered 2022-11-17: 100 mg via INTRAVENOUS

## 2022-11-17 SURGICAL SUPPLY — 49 items
ADH SKN CLS APL DERMABOND .7 (GAUZE/BANDAGES/DRESSINGS) ×1
APL PRP STRL LF DISP 70% ISPRP (MISCELLANEOUS) ×1
APPLIER CLIP 9.375 MED OPEN (MISCELLANEOUS)
APR CLP MED 9.3 20 MLT OPN (MISCELLANEOUS)
BAG DECANTER FOR FLEXI CONT (MISCELLANEOUS) IMPLANT
BINDER BREAST 3XL (GAUZE/BANDAGES/DRESSINGS) IMPLANT
BLADE SURG 15 STRL LF DISP TIS (BLADE) ×1 IMPLANT
BLADE SURG 15 STRL SS (BLADE) ×1
CANISTER SUCT 1200ML W/VALVE (MISCELLANEOUS) ×1 IMPLANT
CHLORAPREP W/TINT 26 (MISCELLANEOUS) ×1 IMPLANT
CLIP APPLIE 9.375 MED OPEN (MISCELLANEOUS) IMPLANT
CLIP TI WIDE RED SMALL 6 (CLIP) IMPLANT
COVER BACK TABLE 60X90IN (DRAPES) ×1 IMPLANT
COVER MAYO STAND STRL (DRAPES) ×1 IMPLANT
DERMABOND ADVANCED .7 DNX12 (GAUZE/BANDAGES/DRESSINGS) ×1 IMPLANT
DRAPE LAPAROSCOPIC ABDOMINAL (DRAPES) IMPLANT
DRAPE LAPAROTOMY 100X72 PEDS (DRAPES) ×1 IMPLANT
DRAPE UTILITY XL STRL (DRAPES) ×1 IMPLANT
ELECT COATED BLADE 2.86 ST (ELECTRODE) ×1 IMPLANT
ELECT REM PT RETURN 9FT ADLT (ELECTROSURGICAL) ×1
ELECTRODE REM PT RTRN 9FT ADLT (ELECTROSURGICAL) ×1 IMPLANT
GLOVE BIO SURGEON STRL SZ7 (GLOVE) IMPLANT
GLOVE BIOGEL PI IND STRL 6.5 (GLOVE) IMPLANT
GLOVE BIOGEL PI IND STRL 7.0 (GLOVE) IMPLANT
GLOVE BIOGEL PI IND STRL 8 (GLOVE) ×1 IMPLANT
GLOVE ECLIPSE 6.5 STRL STRAW (GLOVE) IMPLANT
GLOVE ECLIPSE 8.0 STRL XLNG CF (GLOVE) ×1 IMPLANT
GOWN STRL REUS W/ TWL LRG LVL3 (GOWN DISPOSABLE) ×2 IMPLANT
GOWN STRL REUS W/ TWL XL LVL3 (GOWN DISPOSABLE) ×1 IMPLANT
GOWN STRL REUS W/TWL LRG LVL3 (GOWN DISPOSABLE) ×2
GOWN STRL REUS W/TWL XL LVL3 (GOWN DISPOSABLE) ×1
KIT MARKER MARGIN INK (KITS) IMPLANT
NDL HYPO 25X1 1.5 SAFETY (NEEDLE) ×1 IMPLANT
NEEDLE HYPO 25X1 1.5 SAFETY (NEEDLE) ×1
NS IRRIG 1000ML POUR BTL (IV SOLUTION) ×1 IMPLANT
PACK BASIN DAY SURGERY FS (CUSTOM PROCEDURE TRAY) ×1 IMPLANT
PENCIL SMOKE EVACUATOR (MISCELLANEOUS) ×1 IMPLANT
SLEEVE SCD COMPRESS KNEE MED (STOCKING) ×1 IMPLANT
SPIKE FLUID TRANSFER (MISCELLANEOUS) ×1 IMPLANT
SPONGE T-LAP 4X18 ~~LOC~~+RFID (SPONGE) ×1 IMPLANT
STAPLER VISISTAT 35W (STAPLE) IMPLANT
SUT MON AB 4-0 PC3 18 (SUTURE) ×1 IMPLANT
SUT SILK 2 0 SH (SUTURE) IMPLANT
SUT VICRYL 3-0 CR8 SH (SUTURE) ×1 IMPLANT
SYR CONTROL 10ML LL (SYRINGE) ×1 IMPLANT
TOWEL GREEN STERILE FF (TOWEL DISPOSABLE) ×2 IMPLANT
TRAY FAXITRON CT DISP (TRAY / TRAY PROCEDURE) IMPLANT
TUBE CONNECTING 20X1/4 (TUBING) ×1 IMPLANT
YANKAUER SUCT BULB TIP NO VENT (SUCTIONS) ×1 IMPLANT

## 2022-11-17 NOTE — Anesthesia Preprocedure Evaluation (Addendum)
Anesthesia Evaluation  Patient identified by MRN, date of birth, ID band Patient awake    Reviewed: Allergy & Precautions, NPO status , Patient's Chart, lab work & pertinent test results  Airway Mallampati: II  TM Distance: >3 FB Neck ROM: Full    Dental  (+) Dental Advisory Given, Loose,    Pulmonary asthma , Current Smoker and Patient abstained from smoking.   Pulmonary exam normal breath sounds clear to auscultation       Cardiovascular negative cardio ROS Normal cardiovascular exam Rhythm:Regular Rate:Normal     Neuro/Psych  PSYCHIATRIC DISORDERS Anxiety Depression  Schizophrenia  negative neurological ROS     GI/Hepatic negative GI ROS, Neg liver ROS,,,  Endo/Other    Morbid obesity (BMI 47)  Renal/GU negative Renal ROS  negative genitourinary   Musculoskeletal negative musculoskeletal ROS (+)    Abdominal   Peds  Hematology negative hematology ROS (+)   Anesthesia Other Findings   Reproductive/Obstetrics                             Anesthesia Physical Anesthesia Plan  ASA: 3  Anesthesia Plan: General   Post-op Pain Management: Tylenol PO (pre-op)*   Induction: Intravenous  PONV Risk Score and Plan: 2 and Ondansetron, Dexamethasone and Midazolam  Airway Management Planned: LMA  Additional Equipment:   Intra-op Plan:   Post-operative Plan: Extubation in OR  Informed Consent: I have reviewed the patients History and Physical, chart, labs and discussed the procedure including the risks, benefits and alternatives for the proposed anesthesia with the patient or authorized representative who has indicated his/her understanding and acceptance.     Dental advisory given  Plan Discussed with: CRNA  Anesthesia Plan Comments:        Anesthesia Quick Evaluation

## 2022-11-17 NOTE — Anesthesia Procedure Notes (Signed)
Procedure Name: LMA Insertion Date/Time: 11/17/2022 10:29 AM  Performed by: Jessica Priest, CRNAPre-anesthesia Checklist: Patient identified, Emergency Drugs available, Suction available, Patient being monitored and Timeout performed Patient Re-evaluated:Patient Re-evaluated prior to induction Oxygen Delivery Method: Circle system utilized Preoxygenation: Pre-oxygenation with 100% oxygen Induction Type: IV induction Ventilation: Mask ventilation without difficulty LMA: LMA inserted LMA Size: 4.0 Number of attempts: 1 Airway Equipment and Method: Bite block Placement Confirmation: positive ETCO2, breath sounds checked- equal and bilateral and CO2 detector Tube secured with: Tape Dental Injury: Teeth and Oropharynx as per pre-operative assessment

## 2022-11-17 NOTE — Op Note (Signed)
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  She has been managed medically and with drainage.  Unfortunately, despite maximal medical management she is recurred on numerous occasions.  She has chronic scarring now with recurrence of intermittent infections involving the left nipple areolar complex.  We discussed options of central duct excision.  We discussed potentially having to excise some skin of the actual areola.  After discussion she wished to proceed due to recurrent infections and failure of previous management options.  Discussed risk of bleeding, infection, nipple loss, nipple and sensation, cosmetic appearance, recurrence, anesthesia risk, need further treatments and/or procedures.   Description of procedure: The patient was met in the holding area questions were answered.  Left breast was marked as correct site.  She was taken back to the operating.  She is placed upon upon the OR table.  After induction of general anesthesia, left breast was prepped and draped in sterile fashion timeout performed.  Proper patient, site and procedure verified.  Local anesthetic was infiltrated along the medial border of the nipple areolar complex.  A curvilinear incision was made along this border.  Dissection was carried laterally under the nipple areolar complex.  There is an area of dense chronic inflammatory tissue medially at about 9:00.  This was extended under the actual nipple itself and then over to about 9:00.  This was involving the skin medially.  A wedge of skin was taken out since  this showed a small sinus track.  We then excised the remainder of the subareolar tissue to include the biopsy marker from her previous biopsy.  All of this was submitted to pathology.  There is signs of chronic inflammation.  The central duct itself was excised and sent as well.  Irrigation was used.  Hemostasis was achieved with cautery.  There is no signs of any ongoing bleeding and the skin was viable with good bleeding edges.  We reconstructed nipple using a 3-0 Vicryl.  This approximated the areola back to the curvilinear incision made.  We then secured this to the underlying breast tissue with 3-0 Vicryl.  The medial incision was approximated to this reconstructed part of the areola with 3-0 Vicryl.  4 Monocryl used to close both incisions.  Dermabond applied.  All counts found to be correct.  Breast binder placed.  The patient was awoke extubated taken to recovery in satisfactory condition.

## 2022-11-17 NOTE — H&P (Signed)
History of Present Illness: Anna Moses is a 47 y.o. female who is seen today for follow-up of chronic left breast subareolar mass found to be a chronic abscess. She at the time of initial presentation in April, was asymptomatic. Unfortunately, these areas keep flaring up and she is required multiple rounds of antibiotics. Repeat imaging showed reaccumulation of the fluid on the left nipple as well as continued inflammatory change. Core biopsy showed inflammatory changes without within seen in the past. She is interested to have the area removed..    Review of Systems: A complete review of systems was obtained from the patient. I have reviewed this information and discussed as appropriate with the patient. See HPI as well for other ROS.    Medical History: Past Medical History:  Diagnosis Date  Asthma, unspecified asthma severity, unspecified whether complicated, unspecified whether persistent ([facility])   There is no problem list on file for this patient.  Past Surgical History:  Procedure Laterality Date  knee surgery    No Known Allergies  No current outpatient medications on file prior to visit.   No current facility-administered medications on file prior to visit.   Family History  Problem Relation Age of Onset  High blood pressure (Hypertension) Mother  Obesity Mother    Social History   Tobacco Use  Smoking Status Every Day  Types: Cigarettes  Smokeless Tobacco Never    Social History   Socioeconomic History  Marital status: Single  Tobacco Use  Smoking status: Every Day  Types: Cigarettes  Smokeless tobacco: Never  Substance and Sexual Activity  Alcohol use: Defer  Drug use: Defer   Objective:   Vitals:  10/24/22 1421  PainSc: 8  PainLoc: Breast   There is no height or weight on file to calculate BMI.  Physical Exam Exam conducted with a chaperone present.  Eyes:  Pupils: Pupils are equal, round, and reactive to light.  Cardiovascular:   Rate and Rhythm: Normal rate.  Pulmonary:  Effort: Pulmonary effort is normal.  Chest:  Breasts: Right: Normal.   Comments: Tenderness and fullness noted left subareolar position. No fluctuance though. No redness. Musculoskeletal:  General: Normal range of motion.  Lymphadenopathy:  Upper Body:  Right upper body: No supraclavicular or axillary adenopathy.  Left upper body: No supraclavicular or axillary adenopathy.  Skin: General: Skin is warm.  Neurological:  General: No focal deficit present.  Mental Status: She is alert.  Psychiatric:  Mood and Affect: Mood normal.     Labs, Imaging and Diagnostic Testing:  CLINICAL DATA: Patient returns today to evaluate a possible subareolar LEFT breast mass and possible enlarged LEFT axillary lymph nodes which were identified on recent screening mammogram.  Patient has a history of presumed LEFT breast retroareolar abscess/mastitis (June-August 2023) which improved with antibiotics. On 09/13/2021, patient had an ultrasound-guided biopsy of the subareolar/intraductal LEFT breast mass which revealed dilated duct with features consistent of rupture including abundant chronic inflammation, foreign body giant cell reaction, foamy histiocytes and areas of acute inflammation consistent with focal abscess formation.  Over the past year, patient has had multiple recurrences (clinically) of the LEFT breast subareolar abscess for which patient has had consults with [facility]. Patient most recently saw Dr. Luisa Hart on 07/25/2022.  EXAM: DIGITAL DIAGNOSTIC UNILATERAL LEFT MAMMOGRAM WITH TOMOSYNTHESIS AND CAD; ULTRASOUND LEFT BREAST LIMITED  TECHNIQUE: Left digital diagnostic mammography and breast tomosynthesis was performed. The images were evaluated with computer-aided detection. ; Targeted ultrasound examination of the left breast was performed.  COMPARISON: Previous exam(s).  ACR Breast Density Category b: There are  scattered areas of fibroglandular density.  FINDINGS: On today's additional diagnostic views, including spot compression with 3D tomosynthesis, there is a persistent mass within the subareolar versus nipple-areolar LEFT breast, with adjacent biopsy clip.  Also, there are mildly prominent lymph nodes confirmed within the lower LEFT axilla.  Targeted ultrasound is performed, again showing a hypoechoic abscess-like collection within the subareolar LEFT breast, 1 o'clock axis, abutting and/or involving the skin, measuring 1.9 x 0.8 x 1.9 cm, stable to slightly enlarged compared to earlier ultrasounds performed in 2023.  LEFT axilla was evaluated with ultrasound showing no enlarged or morphologically abnormal lymph nodes.  IMPRESSION: 1. Recurrent abscess-like collection within the subareolar LEFT breast, 1 o'clock axis, measuring 1.9 x 0.8 x 1.9 cm, stable to slightly enlarged compared to previous ultrasounds performed in 2023, with multiple recurrences clinically over the past 2 years and persistent despite previous courses of antibiotics. Recommend surgical consultation for possible excision. 2. No evidence of malignancy within the LEFT breast. 3. No evidence of malignancy or acute findings within the LEFT axilla. No enlarged lymph nodes within the LEFT axilla.  RECOMMENDATION: 1. Surgical consultation for possible excision of the recurrent abscess-like collection within the subareolar LEFT breast. 2. Annual screening mammograms.  I have discussed the findings and recommendations with the patient. If applicable, a reminder letter will be sent to the patient regarding the next appointment.  BI-RADS CATEGORY 2: Benign.   Electronically Signed By: Bary Richard M.D. On: 10/12/2022 10:47 Diagnosis Breast, left, needle core biopsy, 1:00 - DILATED DUCT WITH FEATURES CONSISTENT OF RUPTURE INCLUDING ABUNDANT CHRONIC INFLAMMATION, FOREIGN BODY GIANT CELL REACTION TO SECRETED  TYPE MATERIAL, FOAMY HISTIOCYTES AND AREAS OF ACUTE INFLAMMATION CONSISTENT WITH FOCAL ABSCESS FORMATION. Microscopic Comment The case was called to [name] at [facility] on 09/14/21. Orene Desanctis MD Pathologist, Electronic Signature Assessment and Plan:   Diagnoses and all orders for this visit:  Subareolar mass of left breast    Patient has failed medical management with recurrent problems with the area in her left nipple. She has had multiple abscesses but unfortunately these have not cleared with medical management. Recommendation left breast central duct excision. Risk of bleeding, infection, nipple loss, loss sensation of the nipple, cosmetic deformity, and recurrence

## 2022-11-17 NOTE — Transfer of Care (Signed)
Immediate Anesthesia Transfer of Care Note  Patient: Anna Moses  Procedure(s) Performed: Procedure(s) (LRB): LEFT BREAST CENTRAL DUCT EXCISION (Left)  Patient Location: PACU  Anesthesia Type: General  Level of Consciousness: awake, sedated, patient cooperative and responds to stimulation  Airway & Oxygen Therapy: Patient Spontanous Breathing and Patient connected to [place] oxygen  Post-op Assessment: Report given to PACU RN, Post -op Vital signs reviewed and stable and Patient moving all extremities  Post vital signs: Reviewed and stable  Complications: No apparent anesthesia complications

## 2022-11-17 NOTE — Anesthesia Postprocedure Evaluation (Signed)
Anesthesia Post Note  Patient: Anna Moses  Procedure(s) Performed: LEFT BREAST CENTRAL DUCT EXCISION (Left: Breast)     Patient location during evaluation: PACU Anesthesia Type: General Level of consciousness: awake and alert Pain management: pain level controlled Vital Signs Assessment: post-procedure vital signs reviewed and stable Respiratory status: spontaneous breathing, nonlabored ventilation, respiratory function stable and patient connected to nasal cannula oxygen Cardiovascular status: blood pressure returned to baseline and stable Postop Assessment: no apparent nausea or vomiting Anesthetic complications: no  No notable events documented.  Last Vitals:  Vitals:   11/17/22 1215 11/17/22 1238  BP: 119/70 113/67  Pulse: 68 62  Resp: 18 16  Temp:  (!) 36.1 C  SpO2: 96% 94%    Last Pain:  Vitals:   11/17/22 1215  TempSrc:   PainSc: 5                   L 

## 2022-11-17 NOTE — Interval H&P Note (Signed)
History and Physical Interval Note:  11/17/2022 9:49 AM  Anna Moses  has presented today for surgery, with the diagnosis of LEFT BREAST MASS.  The various methods of treatment have been discussed with the patient and family. After consideration of risks, benefits and other options for treatment, the patient has consented to  Procedure(s): LEFT BREAST CENTRAL DUCT EXCISION (Left) as a surgical intervention.  The patient's history has been reviewed, patient examined, no change in status, stable for surgery.  I have reviewed the patient's chart and labs.  Questions were answered to the patient's satisfaction.   The procedure has been discussed with the patient. Alternatives to surgery have been discussed with the patient.  Risks of surgery include bleeding,  Infection,  Seroma formation, death,  and the need for further surgery.   The patient understands and wishes to proceed.    A 

## 2022-11-17 NOTE — Discharge Instructions (Signed)
[facility] Office Phone Number [phone]  BREAST BIOPSY/ PARTIAL MASTECTOMY: POST OP INSTRUCTIONS  Always review your discharge instruction sheet given to you by the facility where your surgery was performed.  IF YOU HAVE DISABILITY OR FAMILY LEAVE FORMS, YOU MUST BRING THEM TO THE OFFICE FOR PROCESSING.  DO NOT GIVE THEM TO YOUR DOCTOR.  A prescription for pain medication may be given to you upon discharge.  Take your pain medication as prescribed, if needed.  If narcotic pain medicine is not needed, then you may take acetaminophen (Tylenol) or ibuprofen (Advil) as needed. Take your usually prescribed medications unless otherwise directed If you need a refill on your pain medication, please contact your pharmacy.  They will contact our office to request authorization.  Prescriptions will not be filled after 5pm or on week-ends. You should eat very light the first 24 hours after surgery, such as soup, crackers, pudding, etc.  Resume your normal diet the day after surgery. Most patients will experience some swelling and bruising in the breast.  Ice packs and a good support bra will help.  Swelling and bruising can take several days to resolve.  It is common to experience some constipation if taking pain medication after surgery.  Increasing fluid intake and taking a stool softener will usually help or prevent this problem from occurring.  A mild laxative (Milk of Magnesia or Miralax) should be taken according to package directions if there are no bowel movements after 48 hours. Unless discharge instructions indicate otherwise, you may remove your bandages 24-48 hours after surgery, and you may shower at that time.  You may have steri-strips (small skin tapes) in place directly over the incision.  These strips should be left on the skin for 7-10 days.  If your surgeon used skin glue on the incision, you may shower in 24 hours.  The glue will flake off over the next 2-3 weeks.  Any  sutures or staples will be removed at the office during your follow-up visit. ACTIVITIES:  You may resume regular daily activities (gradually increasing) beginning the next day.  Wearing a good support bra or sports bra minimizes pain and swelling.  You may have sexual intercourse when it is comfortable. You may drive when you no longer are taking prescription pain medication, you can comfortably wear a seatbelt, and you can safely maneuver your car and apply brakes. RETURN TO WORK:  ______________________________________________________________________________________ You should see your doctor in the office for a follow-up appointment approximately two weeks after your surgery.  Your doctor's nurse will typically make your follow-up appointment when she calls you with your pathology report.  Expect your pathology report 2-3 business days after your surgery.  You may call to check if you do not hear from us after three days. OTHER INSTRUCTIONS: _______________________________________________________________________________________________ _____________________________________________________________________________________________________________________________________ _____________________________________________________________________________________________________________________________________ _____________________________________________________________________________________________________________________________________  WHEN TO CALL YOUR DOCTOR: Fever over 101.0 Nausea and/or vomiting. Extreme swelling or bruising. Continued bleeding from incision. Increased pain, redness, or drainage from the incision.  The clinic staff is available to answer your questions during regular business hours.  Please don't hesitate to call and ask to speak to one of the nurses for clinical concerns.  If you have a medical emergency, go to the nearest emergency room or call 911.  A surgeon from [facility] is always on call at the hospital.  For further questions, please visit [email or web address]   

## 2022-11-18 ENCOUNTER — Encounter (HOSPITAL_BASED_OUTPATIENT_CLINIC_OR_DEPARTMENT_OTHER): Payer: Self-pay | Admitting: Surgery

## 2022-11-21 ENCOUNTER — Encounter: Payer: Self-pay | Admitting: Surgery

## 2022-12-01 ENCOUNTER — Encounter (HOSPITAL_COMMUNITY): Payer: Self-pay | Admitting: *Deleted

## 2022-12-01 ENCOUNTER — Ambulatory Visit (HOSPITAL_COMMUNITY)
Admission: EM | Admit: 2022-12-01 | Discharge: 2022-12-01 | Disposition: A | Discharge status: Home / Self Care | Payer: MEDICAID | Attending: Family Medicine | Admitting: Family Medicine

## 2022-12-01 DIAGNOSIS — L309 Dermatitis, unspecified: Secondary | ICD-10-CM | POA: Diagnosis not present

## 2022-12-01 DIAGNOSIS — L0390 Cellulitis, unspecified: Secondary | ICD-10-CM | POA: Diagnosis not present

## 2022-12-01 DIAGNOSIS — T8149XA Infection following a procedure, other surgical site, initial encounter: Secondary | ICD-10-CM | POA: Diagnosis not present

## 2022-12-01 MED ORDER — KETOROLAC TROMETHAMINE 30 MG/ML IJ SOLN
30.0000 mg | Freq: Once | INTRAMUSCULAR | Status: AC
  Administered 2022-12-01: 30 mg via INTRAMUSCULAR

## 2022-12-01 MED ORDER — CLOTRIMAZOLE-BETAMETHASONE 1-0.05 % EX CREA: TOPICAL_CREAM | CUTANEOUS | 0 refills | Status: AC

## 2022-12-01 MED ORDER — OXYCODONE HCL 5 MG PO TABS: 5.0000 mg | ORAL_TABLET | Freq: Four times a day (QID) | ORAL | 0 refills | Status: AC | PRN

## 2022-12-01 MED ORDER — KETOROLAC TROMETHAMINE 30 MG/ML IJ SOLN
INTRAMUSCULAR | Status: AC
  Filled 2022-12-01: qty 1

## 2022-12-01 MED ORDER — MUPIROCIN 2 % EX OINT: 1.0000 | TOPICAL_OINTMENT | Freq: Two times a day (BID) | CUTANEOUS | 0 refills | Status: AC

## 2022-12-01 MED ORDER — DOXYCYCLINE HYCLATE 100 MG PO CAPS: 100.0000 mg | ORAL_CAPSULE | Freq: Two times a day (BID) | ORAL | 0 refills | Status: AC

## 2022-12-01 NOTE — ED Provider Notes (Signed)
[facility]    CSN: [id number] Arrival date & time: 12/01/22  1226      History   Chief Complaint Chief Complaint  Patient presents with   Breast Problem   Rash    HPI Anna Moses is a 47 y.o. female.    Rash Here for pain and drainage from an incision on her left breast.  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  Pathology did just show scarring and evidence of previous infection.  A few days ago it started draining from the OpSite and then last night started draining a little blood also.  The drainage has not been thick or purulent.  No fever or vomiting.  She also has a pruritic rash on her upper lumbar region.   She is not allergic to any medications, but Tylenol, aspirin, and ibuprofen hurt her stomach.  She was prescribed a quantity of 15 oxycodone after the operation and that did help and she did already.  When she was not taking the oxycodone, she began having pain. She called the surgical practice and she was instructed to come to the urgent care for [place] to "put something on it".   Past Medical History:  Diagnosis Date   Asthma    Bronchitis    Depression    Obesity    Schizophrenia [facility])     Patient Active Problem List   Diagnosis Date Noted   Schizoaffective disorder, depressive type (HCC) 10/25/2022   Generalized anxiety disorder 10/25/2022   Marijuana use 10/25/2022   Foreign body in left ear 10/08/2021   Hematuria 09/09/2021   Left breast abscess 08/29/2021   Lower back pain 07/14/2021   Contraception management 11/17/2020   Depression, recurrent (HCC) 11/17/2020   Cervical cancer screening 09/18/2019   Routine screening for STI (sexually transmitted infection) 09/18/2019   Cyclical vomiting 09/03/2019   Schizophrenia (HCC) 07/24/2019   H. pylori infection 07/24/2019   Seasonal allergies 07/24/2019   Dysphagia 06/03/2019   Nausea  and vomiting 06/03/2019   Liver lesion 06/03/2019    Past Surgical History:  Procedure Laterality Date   BREAST BIOPSY Left 09/13/2021   BREAST DUCTAL SYSTEM EXCISION Left 11/17/2022   Procedure: LEFT BREAST CENTRAL DUCT EXCISION;  Surgeon: Harriette Bouillon, MD;  Location: [facility];  Service: General;  Laterality: Left;   CESAREAN SECTION     CHONDROPLASTY Left 06/02/2022   Procedure: CHONDROPLASTY;  Surgeon: Bjorn Pippin, MD;  Location: [facility];  Service: Orthopedics;  Laterality: Left;   COLONOSCOPY  06/27/2019   KNEE ARTHROSCOPY WITH LATERAL RELEASE Left 06/02/2022   Procedure: KNEE ARTHROSCOPY WITH LATERAL RELEASE;  Surgeon: Bjorn Pippin, MD;  Location: [facility];  Service: Orthopedics;  Laterality: Left;   KNEE ARTHROSCOPY WITH MEDIAL MENISECTOMY Left 06/02/2022   Procedure: KNEE ARTHROSCOPY WITH MEDIAL MENISECTOMY;  Surgeon: Bjorn Pippin, MD;  Location: [facility];  Service: Orthopedics;  Laterality: Left;   UPPER GASTROINTESTINAL ENDOSCOPY  06/27/2019    OB History   No obstetric history on file.      Home Medications    Prior to Admission medications   Medication Sig Start Date End Date Taking? Authorizing Provider  clotrimazole-betamethasone (LOTRISONE) cream Apply to affected area on back 2 times daily until improved 12/01/22  Yes Senita Corredor, Janace Aris, MD  doxycycline (VIBRAMYCIN) 100 MG capsule Take 1  capsule (100 mg total) by mouth 2 (two) times daily for 7 days. 12/01/22 12/08/22 Yes Zenia Resides, MD  estradiol cypionate (DEPO-ESTRADIOL) 5 MG/ML injection Inject into the muscle every 28 (twenty-eight) days.   Yes [provider]  mupirocin ointment (BACTROBAN) 2 % Apply 1 Application topically 2 (two) times daily. To affected area that is infected till better 12/01/22  Yes Fabiola Mudgett, Janace Aris, MD  oxyCODONE (ROXICODONE) 5 MG immediate release tablet Take 1 tablet (5 mg total) by mouth every 6  (six) hours as needed for severe pain. 12/01/22  Yes Zenia Resides, MD  albuterol (VENTOLIN HFA) 108 (90 Base) MCG/ACT inhaler Inhale 1-2 puffs into the lungs every 6 (six) hours as needed for wheezing or shortness of breath. 05/18/22   Carlisle Beers, FNP  famotidine (PEPCID) 20 MG tablet Take 1 tablet (20 mg total) by mouth 2 (two) times daily. 06/17/22   Vanetta Mulders, MD  omeprazole (PRILOSEC) 20 MG capsule Take 1 capsule (20 mg total) by mouth daily for 14 days. 06/17/22 07/01/22  Rising, Lurena Joiner, PA-C  promethazine (PHENERGAN) 25 MG suppository Place 1 suppository (25 mg total) rectally every 6 (six) hours as needed for nausea or vomiting. 06/21/22   Schutt, Edsel Petrin, PA-C  diphenhydrAMINE (BENADRYL) 25 MG tablet Take 1 tablet (25 mg total) by mouth every 6 (six) hours. Patient not taking: Reported on 12/12/2018 10/21/17 05/30/19  Arby Barrette, MD  Ibuprofen-diphenhydrAMINE Cit (ADVIL PM) 200-38 MG TABS Take 2 tablets by mouth 2 (two) times daily as needed (headaches).  05/30/19  [provider]    Family History Family History  Problem Relation Age of Onset   Crohn's disease Mother    Liver disease Father    Clotting disorder Father    Liver disease Brother    Colon cancer Neg Hx    Esophageal cancer Neg Hx    Rectal cancer Neg Hx    Stomach cancer Neg Hx    Breast cancer Neg Hx     Social History Social History   Tobacco Use   Smoking status: Some Days    Current packs/day: 0.50    Types: Cigarettes    Passive exposure: Current   Smokeless tobacco: Never  Vaping Use   Vaping status: Never Used  Substance Use Topics   Alcohol use: Not Currently   Drug use: Not Currently    Types: Marijuana    Comment: occ     Allergies   Patient has no known allergies.   Review of Systems Review of Systems  Skin:  Positive for rash.     Physical Exam Triage Vital Signs ED Triage Vitals  Encounter Vitals Group     BP 12/01/22 1301 (!) 145/86     Systolic  BP Percentile --      Diastolic BP Percentile --      Pulse Rate 12/01/22 1301 90     Resp 12/01/22 1301 20     Temp 12/01/22 1301 98.4 F (36.9 C)     Temp Source 12/01/22 1301 Oral     SpO2 12/01/22 1301 98 %     Weight --      Height --      Head Circumference --      Peak Flow --      Pain Score 12/01/22 1258 8     Pain Loc --      Pain Education --      Exclude from Growth Chart --  No data found.  Updated Vital Signs BP (!) 145/86 (BP Location: Right Arm)   Pulse 90   Temp 98.4 F (36.9 C) (Oral)   Resp 20   LMP 10/17/2022 (Approximate)   SpO2 98%   Visual Acuity Right Eye Distance:   Left Eye Distance:   Bilateral Distance:    Right Eye Near:   Left Eye Near:    Bilateral Near:     Physical Exam Vitals reviewed.  Constitutional:      General: She is not in acute distress.    Appearance: She is not ill-appearing, toxic-appearing or diaphoretic.  Cardiovascular:     Rate and Rhythm: Normal rate and regular rhythm.  Pulmonary:     Effort: Pulmonary effort is normal.     Breath sounds: Normal breath sounds.  Chest:       Comments: There is crusting and induration and erythema and exquisite tenderness of the area noted on the diagram.  This area of induration and tenderness is approximately 8 cm x 4 cm.  There is no fluctuance at the time of exam.   Skin:    Coloration: Skin is not pale.     Comments: There is a bumpy rash in a circular distribution in her right upper lumbar region.  There is no ulceration and it is not vesicular or herpetiform.  Neurological:     General: No focal deficit present.     Mental Status: She is alert and oriented to person, place, and time.  Psychiatric:        Behavior: Behavior normal.      UC Treatments / Results  Labs (all labs ordered are listed, but only abnormal results are displayed) Labs Reviewed - No data to display  EKG   Radiology No results found.  Procedures Procedures (including critical care  time)  Medications Ordered in UC Medications  ketorolac (TORADOL) 30 MG/ML injection 30 mg (has no administration in time range)    Initial Impression / Assessment and Plan / UC Course  I have reviewed the triage vital signs and the nursing notes.  Pertinent labs & imaging results that were available during my care of the patient were reviewed by me and considered in my medical decision making (see chart for details).        Vital signs are benign.  I think she has a wound infection and cellulitis.  Doxycycline is sent in to treat as is Bactroban.  Oxycodone is sent in for pain as she is exquisitely tender.  There are no duplications on PMP, and she would have finished her oxycodone within 7 days of her original surgery. Toradol was also given for pain here  Lotrisone cream is sent in for the dermatitis on her back.   I have asked her to please follow-up with her surgical office that performed the surgery.  If she is worsening in any way she is to present to the emergency room.   Final Clinical Impressions(s) / UC Diagnoses   Final diagnoses:  Postoperative wound infection  Cellulitis, unspecified cellulitis site  Dermatitis     Discharge Instructions      Take doxycycline 100 mg --1 capsule 2 times daily for 7 days  Put mupirocin ointment on the sore infected area twice daily until improved (this is an antibiotic ointment)  Oxycodone 5 mg--1 tablet every 6 hours as needed for pain  You have been given a shot of Toradol 30 mg today.    Clotrimazole-betamethasone cream--apply this cream  to the rash area on your back 2 times daily until better.  Please call the surgical office when you get home today.  I think they need to see you sometime in the next couple of days to make sure that you are getting better.        ED Prescriptions     Medication Sig Dispense Auth. Provider   doxycycline (VIBRAMYCIN) 100 MG capsule Take 1 capsule (100 mg total) by mouth 2  (two) times daily for 7 days. 14 capsule Bryton Waight, Janace Aris, MD   oxyCODONE (ROXICODONE) 5 MG immediate release tablet Take 1 tablet (5 mg total) by mouth every 6 (six) hours as needed for severe pain. 12 tablet Mikias Lanz, Janace Aris, MD   clotrimazole-betamethasone (LOTRISONE) cream Apply to affected area on back 2 times daily until improved 15 g Iliya Spivack, Janace Aris, MD   mupirocin ointment (BACTROBAN) 2 % Apply 1 Application topically 2 (two) times daily. To affected area that is infected till better 22 g Marlinda Mike, Janace Aris, MD      I have reviewed the PDMP during this encounter.   Zenia Resides, MD 12/01/22 [phone]

## 2022-12-01 NOTE — Discharge Instructions (Addendum)
Take doxycycline 100 mg --1 capsule 2 times daily for 7 days  Put mupirocin ointment on the sore infected area twice daily until improved (this is an antibiotic ointment)  Oxycodone 5 mg--1 tablet every 6 hours as needed for pain  You have been given a shot of Toradol 30 mg today.    Clotrimazole-betamethasone cream--apply this cream to the rash area on your back 2 times daily until better.  Please call the surgical office when you get home today.  I think they need to see you sometime in the next couple of days to make sure that you are getting better.

## 2022-12-01 NOTE — ED Triage Notes (Signed)
Pt states she had left breast surgery on 11/17/2022 and she has been having some draining dn bleeding last night. She called the on call RN last night and she advised her to come to UC to have it checked. She states she is in a lot of pain. She states she was given oxy but only #15 and they are gone now. She states she was taking OTC pain relief, she states she can't pain tylneol or ASA due to gastric issues.    She states on Wednesday she had a cold sweat and started vomiting but none since.    She has a rash on the mid back she would like checked.

## 2022-12-27 ENCOUNTER — Ambulatory Visit (INDEPENDENT_AMBULATORY_CARE_PROVIDER_SITE_OTHER): Payer: MEDICAID | Admitting: Mental Health

## 2022-12-27 DIAGNOSIS — F411 Generalized anxiety disorder: Secondary | ICD-10-CM

## 2022-12-27 DIAGNOSIS — F251 Schizoaffective disorder, depressive type: Secondary | ICD-10-CM

## 2022-12-27 DIAGNOSIS — F1290 Cannabis use, unspecified, uncomplicated: Secondary | ICD-10-CM
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
## 2022-12-28 ENCOUNTER — Ambulatory Visit (HOSPITAL_COMMUNITY): Payer: MEDICAID | Admitting: Physician Assistant

## 2022-12-28 ENCOUNTER — Encounter (HOSPITAL_COMMUNITY): Payer: Self-pay | Admitting: Physician Assistant

## 2022-12-28 VITALS — BP 134/78 | HR 90 | Temp 98.4°F | Ht 61.0 in | Wt 244.4 lb

## 2022-12-28 DIAGNOSIS — F251 Schizoaffective disorder, depressive type: Secondary | ICD-10-CM | POA: Diagnosis not present

## 2022-12-28 DIAGNOSIS — F411 Generalized anxiety disorder: Secondary | ICD-10-CM

## 2022-12-28 DIAGNOSIS — F1290 Cannabis use, unspecified, uncomplicated: Secondary | ICD-10-CM

## 2022-12-28 DIAGNOSIS — F4310 Post-traumatic stress disorder, unspecified: Secondary | ICD-10-CM | POA: Diagnosis not present

## 2022-12-28 DIAGNOSIS — F99 Mental disorder, not otherwise specified: Secondary | ICD-10-CM | POA: Insufficient documentation

## 2022-12-28 MED ORDER — SERTRALINE HCL 50 MG PO TABS: ORAL_TABLET | ORAL | 1 refills | Status: DC

## 2022-12-28 MED ORDER — ARIPIPRAZOLE 5 MG PO TABS: ORAL_TABLET | ORAL | 1 refills | Status: DC

## 2022-12-28 MED ORDER — TRAZODONE HCL 50 MG PO TABS
50.0000 mg | ORAL_TABLET | Freq: Every day | ORAL | 1 refills | Status: DC
Start: 2022-12-28 — End: 2023-02-08
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
## 2023-02-08 ENCOUNTER — Encounter (HOSPITAL_COMMUNITY): Payer: Self-pay

## 2023-02-08 ENCOUNTER — Ambulatory Visit (HOSPITAL_COMMUNITY): Payer: 59 | Admitting: Mental Health

## 2023-02-08 ENCOUNTER — Telehealth (INDEPENDENT_AMBULATORY_CARE_PROVIDER_SITE_OTHER): Payer: 59 | Admitting: Physician Assistant

## 2023-02-08 ENCOUNTER — Encounter (HOSPITAL_COMMUNITY): Payer: Self-pay | Admitting: Physician Assistant

## 2023-02-08 DIAGNOSIS — F4310 Post-traumatic stress disorder, unspecified: Secondary | ICD-10-CM

## 2023-02-08 DIAGNOSIS — F251 Schizoaffective disorder, depressive type: Secondary | ICD-10-CM

## 2023-02-08 DIAGNOSIS — F411 Generalized anxiety disorder: Secondary | ICD-10-CM | POA: Diagnosis not present

## 2023-02-08 MED ORDER — TRAZODONE HCL 100 MG PO TABS
100.0000 mg | ORAL_TABLET | Freq: Every day | ORAL | 1 refills | Status: DC
Start: 2023-02-08 — End: 2023-05-18

## 2023-02-08 MED ORDER — ARIPIPRAZOLE 5 MG PO TABS
5.0000 mg | ORAL_TABLET | Freq: Every day | ORAL | 1 refills | Status: DC
Start: 2023-02-08 — End: 2023-05-18

## 2023-02-08 MED ORDER — SERTRALINE HCL 100 MG PO TABS
100.0000 mg | ORAL_TABLET | Freq: Every day | ORAL | 1 refills | Status: DC
Start: 2023-02-08 — End: 2023-05-18

## 2023-02-08 NOTE — Progress Notes (Unsigned)
BH MD/PA/NP OP Progress Note  Virtual Visit via Video Note  I connected with Anna Moses on 02/08/23 at 11:00 AM EDT by a video enabled telemedicine application and verified that I am speaking with the correct person using two identifiers.  Location: Patient: Home Provider: Clinic   I discussed the limitations of evaluation and management by telemedicine and the availability of in person appointments. The patient expressed understanding and agreed to proceed.  Follow Up Instructions:  I discussed the assessment and treatment plan with the patient. The patient was provided an opportunity to ask questions and all were answered. The patient agreed with the plan and demonstrated an understanding of the instructions.   The patient was advised to call back or seek an in-person evaluation if the symptoms worsen or if the condition fails to improve as anticipated.  I provided 21 minutes of non-face-to-face time during this encounter.  Meta Hatchet, PA   02/08/2023 11:59 AM Anna Moses  MRN:  [id number]  Chief Complaint:  Chief Complaint  Patient presents with   Follow-up   Medication Management   HPI: ***  Anna Moses  Visit Diagnosis:    ICD-10-CM   1. Schizoaffective disorder, depressive type (HCC)  F25.1 sertraline (ZOLOFT) 100 MG tablet    traZODone (DESYREL) 100 MG tablet    ARIPiprazole (ABILIFY) 5 MG tablet    2. Generalized anxiety disorder  F41.1 sertraline (ZOLOFT) 100 MG tablet    3. PTSD (post-traumatic stress disorder)  F43.10 sertraline (ZOLOFT) 100 MG tablet      Past Psychiatric History:  Patient endorses a past psychiatric history significant for schizophrenia   Patient endorses a past history of hospitalization due to mental health.  Patient reports that she was last hospitalized a few years ago.   Patient endorses a past history of suicide attempt stating that she last attempted several years ago.   Patient denies a past  history of homicide attempt  Past Medical History:  Past Medical History:  Diagnosis Date   Asthma    Bronchitis    Depression    Obesity    Schizophrenia (HCC)     Past Surgical History:  Procedure Laterality Date   BREAST BIOPSY Left 09/13/2021   BREAST DUCTAL SYSTEM EXCISION Left 11/17/2022   Procedure: LEFT BREAST CENTRAL DUCT EXCISION;  Surgeon: Harriette Bouillon, MD;  Location: [facility];  Service: General;  Laterality: Left;   CESAREAN SECTION     CHONDROPLASTY Left 06/02/2022   Procedure: CHONDROPLASTY;  Surgeon: Bjorn Pippin, MD;  Location: [facility];  Service: Orthopedics;  Laterality: Left;   COLONOSCOPY  06/27/2019   KNEE ARTHROSCOPY WITH LATERAL RELEASE Left 06/02/2022   Procedure: KNEE ARTHROSCOPY WITH LATERAL RELEASE;  Surgeon: Bjorn Pippin, MD;  Location: [facility];  Service: Orthopedics;  Laterality: Left;   KNEE ARTHROSCOPY WITH MEDIAL MENISECTOMY Left 06/02/2022   Procedure: KNEE ARTHROSCOPY WITH MEDIAL MENISECTOMY;  Surgeon: Bjorn Pippin, MD;  Location: [facility];  Service: Orthopedics;  Laterality: Left;   UPPER GASTROINTESTINAL ENDOSCOPY  06/27/2019    Family Psychiatric History:  Mother - unsure of diagnosis Sister - unsure of diagnosis Brother - unsure of diagnosis   Family history of suicide attempt: Patient denies Family history of homicide attempt: Patient denies Family history of substance abuse: Patient reports that her mother, father, brother, and sister abused substances  Family History:  Family History  Problem Relation Age of Onset  Crohn's disease Mother    Liver disease Father    Clotting disorder Father    Liver disease Brother    Colon cancer Neg Hx    Esophageal cancer Neg Hx    Rectal cancer Neg Hx    Stomach cancer Neg Hx    Breast cancer Neg Hx     Social History:  Social History   Socioeconomic History   Marital status: Single    Spouse name: Not on file    Number of children: 4   Years of education: Not on file   Highest education level: Not on file  Occupational History   Occupation: stay at home mother  Tobacco Use   Smoking status: Some Days    Current packs/day: 0.50    Types: Cigarettes    Passive exposure: Current   Smokeless tobacco: Never  Vaping Use   Vaping status: Never Used  Substance and Sexual Activity   Alcohol use: Not Currently   Drug use: Not Currently    Types: Marijuana    Comment: occ   Sexual activity: Not Currently    Birth control/protection: None  Other Topics Concern   Not on file  Social History Narrative   Not on file   Social Determinants of Health   Financial Resource Strain: Medium Risk (10/25/2022)   Overall Financial Resource Strain (CARDIA)    Difficulty of Paying Living Expenses: Somewhat hard  Food Insecurity: No Food Insecurity (10/25/2022)   Hunger Vital Sign    Worried About Running Out of Food in the Last Year: Never true    Ran Out of Food in the Last Year: Never true  Transportation Needs: Unmet Transportation Needs (10/25/2022)   PRAPARE - Transportation    Lack of Transportation (Medical): Yes    Lack of Transportation (Non-Medical): Yes  Physical Activity: Inactive (10/25/2022)   Exercise Vital Sign    Days of Exercise per Week: 0 days    Minutes of Exercise per Session: 0 min  Stress: Stress Concern Present (10/25/2022)   [employer] of Occupational Health - Occupational Stress Questionnaire    Feeling of Stress : Very much  Social Connections: Socially Isolated (10/25/2022)   Social Connection and Isolation Panel [NHANES]    Frequency of Communication with Friends and Family: More than three times a week    Frequency of Social Gatherings with Friends and Family: Never    Attends Religious Services: Never    Database administrator or Organizations: No    Attends Engineer, structural: Never    Marital Status: Never married    Allergies: No Known  Allergies  Metabolic Disorder Labs: Lab Results  Component Value Date   HGBA1C 5.4 08/20/2019   No results found for: "PROLACTIN" No results found for: "CHOL", "TRIG", "HDL", "CHOLHDL", "VLDL", "LDLCALC" No results found for: "TSH"  Therapeutic Level Labs: No results found for: "LITHIUM" No results found for: "VALPROATE" No results found for: "CBMZ"  Current Medications: Current Outpatient Medications  Medication Sig Dispense Refill   albuterol (VENTOLIN HFA) 108 (90 Base) MCG/ACT inhaler Inhale 1-2 puffs into the lungs every 6 (six) hours as needed for wheezing or shortness of breath. 8 g 0   ARIPiprazole (ABILIFY) 5 MG tablet Take 1 tablet (5 mg total) by mouth daily. 30 tablet 1   clotrimazole-betamethasone (LOTRISONE) cream Apply to affected area on back 2 times daily until improved 15 g 0   estradiol cypionate (DEPO-ESTRADIOL) 5 MG/ML injection Inject into the muscle every 28 (  twenty-eight) days.     famotidine (PEPCID) 20 MG tablet Take 1 tablet (20 mg total) by mouth 2 (two) times daily. 30 tablet 0   mupirocin ointment (BACTROBAN) 2 % Apply 1 Application topically 2 (two) times daily. To affected area that is infected till better 22 g 0   omeprazole (PRILOSEC) 20 MG capsule Take 1 capsule (20 mg total) by mouth daily for 14 days. 14 capsule 0   oxyCODONE (ROXICODONE) 5 MG immediate release tablet Take 1 tablet (5 mg total) by mouth every 6 (six) hours as needed for severe pain. 12 tablet 0   promethazine (PHENERGAN) 25 MG suppository Place 1 suppository (25 mg total) rectally every 6 (six) hours as needed for nausea or vomiting. 12 each 0   sertraline (ZOLOFT) 100 MG tablet Take 1 tablet (100 mg total) by mouth daily. 30 tablet 1   traZODone (DESYREL) 100 MG tablet Take 1 tablet (100 mg total) by mouth at bedtime. 30 tablet 1   No current facility-administered medications for this visit.     Musculoskeletal: Strength & Muscle Tone: within normal limits Gait & Station:  normal Patient leans: N/A  Psychiatric Specialty Exam: Review of Systems  Psychiatric/Behavioral:  Positive for dysphoric mood, hallucinations and sleep disturbance. Negative for decreased concentration, self-injury and suicidal ideas. The patient is nervous/anxious. The patient is not hyperactive.     There were no vitals taken for this visit.There is no height or weight on file to calculate BMI.  General Appearance: Casual  Eye Contact:  Good  Speech:  Clear and Coherent and Normal Rate  Volume:  Normal  Mood:  Anxious and Depressed  Affect:  Congruent  Thought Process:  Coherent, Goal Directed, and Descriptions of Associations: Intact  Orientation:  Full (Time, Place, and Person)  Thought Content: Hallucinations: Auditory and Paranoid Ideation   Suicidal Thoughts:  No  Homicidal Thoughts:  No  Memory:  Immediate;   Good Recent;   Fair Remote;   Fair  Judgement:  Good  Insight:  Present  Psychomotor Activity:  Normal  Concentration:  Concentration: Good and Attention Span: Good  Recall:  Fair  Fund of Knowledge: Good  Language: Good  Akathisia:  No  Handed:  Right  AIMS (if indicated): not done  Assets:  Communication Skills Desire for Improvement Housing Social Support  ADL's:  Intact  Cognition: WNL  Sleep:  Poor   Screenings: GAD-7    Flowsheet Row Video Visit from 02/08/2023 in [facility] Office Visit from 12/28/2022 in [facility] Counselor from 10/25/2022 in [facility]  Total GAD-7 Score 17 21 12       PHQ2-9    Flowsheet Row Video Visit from 02/08/2023 in [facility] Office Visit from 12/28/2022 in [facility] Counselor from 10/25/2022 in [facility] Office Visit from 09/08/2022 in [facility] Office Visit from 06/14/2022 in [facility]  PHQ-2  Total Score 4 4 3 3 2   PHQ-9 Total Score 20 22 14 14 4       Flowsheet Row Video Visit from 02/08/2023 in [facility] Office Visit from 12/28/2022 in [facility] ED from 12/01/2022 in [facility] RISK CATEGORY Moderate Risk Low Risk No Risk        Assessment and Plan:   ***  Collaboration of Care: Collaboration  of Care: Medication Management AEB provider managing patient's psychiatric medications, Primary Care Provider AEB patient being seen by a family medicine provider, Psychiatrist AEB patient being followed by a mental health provider at this facility, and Referral or follow-up with counselor/therapist AEB patient being seen by a licensed clinical social worker at this facility  Patient/Guardian was advised Release of Information must be obtained prior to any record release in order to collaborate their care with an outside provider. Patient/Guardian was advised if they have not already done so to contact the registration department to sign all necessary forms in order for [place] to release information regarding their care.   Consent: Patient/Guardian gives verbal consent for treatment and assignment of benefits for services provided during this visit. Patient/Guardian expressed understanding and agreed to proceed.   1. Schizoaffective disorder, depressive type (HCC)  - sertraline (ZOLOFT) 100 MG tablet; Take 1 tablet (100 mg total) by mouth daily.  Dispense: 30 tablet; Refill: 1 - traZODone (DESYREL) 100 MG tablet; Take 1 tablet (100 mg total) by mouth at bedtime.  Dispense: 30 tablet; Refill: 1 - ARIPiprazole (ABILIFY) 5 MG tablet; Take 1 tablet (5 mg total) by mouth daily.  Dispense: 30 tablet; Refill: 1  2. Generalized anxiety disorder  - sertraline (ZOLOFT) 100 MG tablet; Take 1 tablet (100 mg total) by mouth daily.  Dispense: 30 tablet; Refill: 1  3. PTSD (post-traumatic stress  disorder)  - sertraline (ZOLOFT) 100 MG tablet; Take 1 tablet (100 mg total) by mouth daily.  Dispense: 30 tablet; Refill: 1  Patient to follow up in 6 weeks Provider spent a total of 21 minutes with the patient/reviewing patient's chart  Meta Hatchet, PA 02/08/2023, 11:59 AM

## 2023-02-09 ENCOUNTER — Ambulatory Visit: Payer: MEDICAID | Admitting: Family Medicine

## 2023-02-09 ENCOUNTER — Telehealth (HOSPITAL_COMMUNITY): Payer: Self-pay | Admitting: *Deleted

## 2023-02-09 NOTE — Telephone Encounter (Signed)
Prior authorization for Aripiprazole 5mg  received. Submitted online with cover my meds. Approved until 02/09/24. Called to notify pharmacy.

## 2023-02-28 ENCOUNTER — Encounter: Payer: Self-pay | Admitting: Student

## 2023-02-28 ENCOUNTER — Ambulatory Visit (INDEPENDENT_AMBULATORY_CARE_PROVIDER_SITE_OTHER): Payer: 59 | Admitting: Student

## 2023-02-28 VITALS — BP 137/93 | HR 100 | Ht 61.0 in | Wt 252.4 lb

## 2023-02-28 DIAGNOSIS — M5431 Sciatica, right side: Secondary | ICD-10-CM

## 2023-02-28 DIAGNOSIS — R030 Elevated blood-pressure reading, without diagnosis of hypertension: Secondary | ICD-10-CM

## 2023-02-28 DIAGNOSIS — Z789 Other specified health status: Secondary | ICD-10-CM | POA: Diagnosis not present

## 2023-02-28 DIAGNOSIS — F251 Schizoaffective disorder, depressive type: Secondary | ICD-10-CM | POA: Diagnosis not present

## 2023-02-28 LAB — POCT URINE PREGNANCY: Preg Test, Ur: NEGATIVE

## 2023-02-28 MED ORDER — MEDROXYPROGESTERONE ACETATE 150 MG/ML IM SUSP
150.0000 mg | Freq: Once | INTRAMUSCULAR | Status: AC
Start: 2023-02-28 — End: 2023-02-28
  Administered 2023-02-28: 150 mg via INTRAMUSCULAR

## 2023-02-28 MED ORDER — NAPROXEN 500 MG PO TABS
500.0000 mg | ORAL_TABLET | Freq: Two times a day (BID) | ORAL | 0 refills | Status: AC
Start: 2023-02-28 — End: 2023-03-05

## 2023-02-28 MED ORDER — KETOROLAC TROMETHAMINE 15 MG/ML IJ SOLN
15.0000 mg | Freq: Once | INTRAMUSCULAR | Status: AC
Start: 2023-02-28 — End: 2023-02-28
  Administered 2023-02-28: 15 mg via INTRAMUSCULAR

## 2023-02-28 NOTE — Progress Notes (Signed)
    SUBJECTIVE:   CHIEF COMPLAINT / HPI: Depo  Need for Depo injection Last period was beginning of this month. Last unprotected sexual intercourse 9-10 months ago.  Blood pressure Elevated today. No hx of HTN  Schizoaffective disorder Follows with behavioral health. On abilify 5 mg daily, sertraline 100 mg daily,   Patient is also dealing with significant emotional stress. She has been having suicidal thoughts, which she attributes to difficulties in her personal life, including issues with her grandkids being bullied at school. She has recently started medication for her mental health and is seeing a therapist and psychiatrist. She believes she is following up with them this month, denies any plan for suicidal ideation.  Right sided sciatica Presents with severe pain that prevented her from attending her previous appointment. The pain is so severe that she struggles to get out of bed in the morning. She has been self-medicating with Advil, taking three 200mg  pills two to three times a day, but reports that it doesn't really help. The pain radiates down her right leg making it difficult for her to walk up stairs. She has no saddle anesthesia, but reports urgency with bowel movements.   PERTINENT  PMH / PSH: PTSD  OBJECTIVE:   BP (!) 137/93   Pulse 100   Ht 5\' 1"  (1.549 m)   Wt 252 lb 6 oz (114.5 kg)   SpO2 100%   BMI 47.69 kg/m   General: NAD, awake, alert, responsive to questions Head: Normocephalic atraumatic CV: Regular rate and rhythm no murmurs rubs or gallops Respiratory: chest rises symmetrically,  no increased work of breathing Extremities: Moves upper and lower extremities freely, no sensation issues, negative seated slump, well-perfused, no tenderness to palpation of back or paraspinal musculature ASSESSMENT/PLAN:   Assessment & Plan Sciatica of right side Has been having severe right-sided sciatica.  No red flag symptoms on exam or history. -Naproxen 500 twice  daily with meals for 5 days, advised no other NSAID use with this -Toradol injection 15 mg in clinic today Elevated blood pressure reading Blood pressure elevated today and on previous visits. -Schedule follow-up appointment in two weeks to monitor blood pressure and consider initiation of antihypertensive medication Uses birth control Last menstrual period at the beginning of the month, no recent unprotected intercourse, and urine pregnancy test negative today. -Administer Depo-Provera injection today.  Schizoaffective disorder, depressive type (HCC) Reports occasional suicidal thoughts but denies any plans to harm self. Recently started medication and is followed by behavioral health clinic. -Encourage patient to schedule earlier follow-up with behavioral health clinic. -Continue current psychiatric medications as prescribed.   Levin Erp, MD [facility]

## 2023-02-28 NOTE — Assessment & Plan Note (Signed)
Reports occasional suicidal thoughts but denies any plans to harm self. Recently started medication and is followed by behavioral health clinic. -Encourage patient to schedule earlier follow-up with behavioral health clinic. -Continue current psychiatric medications as prescribed.

## 2023-02-28 NOTE — Patient Instructions (Addendum)
It was great to see you! Thank you for allowing me to participate in your care!   Our plans for today:  - Call behavioral health for follow up this month - Come back in 2 weeks for BP recheck - We will give a dose of toradol today and do naproxen 500 mg twice a day for 5 days with meals please do not take any other NSAIDs with this, you can do up to 4 g of Tylenol a day for pain control -We gave you a Depo shot today -Let us know if interested in physical therapy  Take care and seek immediate care sooner if you develop any concerns.  Levin Erp, MD

## 2023-02-28 NOTE — Addendum Note (Signed)
Addended by: Aquilla Solian on: 02/28/2023 03:23 PM   Modules accepted: Orders

## 2023-03-13 ENCOUNTER — Ambulatory Visit (INDEPENDENT_AMBULATORY_CARE_PROVIDER_SITE_OTHER): Payer: 59 | Admitting: Student

## 2023-03-13 ENCOUNTER — Ambulatory Visit: Payer: 59 | Admitting: Student

## 2023-03-13 ENCOUNTER — Encounter: Payer: Self-pay | Admitting: Student

## 2023-03-13 VITALS — BP 128/78 | HR 74 | Ht 61.0 in | Wt 253.2 lb

## 2023-03-13 DIAGNOSIS — R030 Elevated blood-pressure reading, without diagnosis of hypertension: Secondary | ICD-10-CM

## 2023-03-13 DIAGNOSIS — F251 Schizoaffective disorder, depressive type: Secondary | ICD-10-CM | POA: Diagnosis not present

## 2023-03-13 NOTE — Patient Instructions (Addendum)
It was great to see you! Thank you for allowing me to participate in your care!   Our plans for today:  - Your blood pressure was normal today, no need to start any medications today, we will continue to monitor at your visits - follow up with your therapist and psychaitrist  If you are feeling suicidal or depression symptoms worsen please immediately go to:   If you are thinking about harming yourself or having thoughts of suicide, or if you know someone who is, seek help right away. If you are in crisis, make sure you are not left alone.  If someone else is in crisis, make sure he/she/they is not left alone  Call 988 OR 1-800-273-TALK  24 Hour Availability for Walk-IN services  [facility]  [address] [place], [place] [phone] Crisis [phone]    Other crisis resources:  [facility] [employer] (Domestic Violence, Rape & Victim Assistance [phone]  [facility] [employer] [facility]    (ONLY from 8am-4pm)    [phone]  [facility] (24/7)   [phone]  [place] National Suicide Hotline   [phone] (TALK)   Take care and seek immediate care sooner if you develop any concerns.  Levin Erp, MD

## 2023-03-13 NOTE — Progress Notes (Signed)
    SUBJECTIVE:   CHIEF COMPLAINT / HPI:   Elevated Blood Pressure Coming in for BP recheck today. Multiple elevated BP in past visits. Vitals:   03/13/23 1419 03/13/23 1443  BP: 134/78 128/78      Passive Suicidality Similar to previous visit.  She states that this is stable and denies any active plans.  Her protective factors are her children and mother.  She states that she has any fleeting thoughts of that she talks to her children who are all in their 93s.  She has a lot of support at home.    03/13/2023    2:20 PM 02/28/2023    1:37 PM 02/08/2023   11:16 AM 12/28/2022    9:26 AM 10/25/2022    8:14 AM  Depression screen PHQ 2/9  Decreased Interest 1 1     Down, Depressed, Hopeless 1 2     PHQ - 2 Score 2 3     Altered sleeping 3 1     Tired, decreased energy 1 1     Change in appetite 3 1     Feeling bad or failure about yourself  1 1     Trouble concentrating 1 1     Moving slowly or fidgety/restless 1 0     Suicidal thoughts 1 1     PHQ-9 Score 13 9     Difficult doing work/chores          Information is confidential and restricted. Go to Review Flowsheets to unlock data.    PERTINENT  PMH / PSH: schizoaffective disorder  OBJECTIVE:   BP 128/78   Pulse 74   Ht 5\' 1"  (1.549 m)   Wt 253 lb 3.2 oz (114.9 kg)   SpO2 99%   BMI 47.84 kg/m   General: Well appearing, NAD, awake, alert, responsive to questions Psych: well groomed, does not appear to be responding to any internal stimuli Head: Normocephalic atraumatic Respiratory: chest rises symmetrically,  no increased work of breathing  ASSESSMENT/PLAN:   Assessment & Plan Elevated blood pressure reading Patient's blood pressure is controlled today. BP: 128/78. No diagnosis of hypertension today. -Monitor at future visits Schizoaffective disorder, depressive type (HCC) Passive suicidal similar to previous visit today. Safety contracts in office. Patient following closely with behavioral health. -Discussed  follow up with BH closely -AVS SI -ED/return precautions discussed    Levin Erp, MD [facility]

## 2023-03-13 NOTE — Assessment & Plan Note (Addendum)
Passive suicidal similar to previous visit today. Safety contracts in office. Patient following closely with behavioral health. -Discussed follow up with BH closely -AVS SI -ED/return precautions discussed

## 2023-04-18 ENCOUNTER — Ambulatory Visit (INDEPENDENT_AMBULATORY_CARE_PROVIDER_SITE_OTHER): Payer: 59 | Admitting: Mental Health

## 2023-04-18 DIAGNOSIS — F251 Schizoaffective disorder, depressive type: Secondary | ICD-10-CM

## 2023-04-18 DIAGNOSIS — F4310 Post-traumatic stress disorder, unspecified: Secondary | ICD-10-CM
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
## 2023-05-18 ENCOUNTER — Ambulatory Visit (INDEPENDENT_AMBULATORY_CARE_PROVIDER_SITE_OTHER): Payer: 59 | Admitting: Physician Assistant

## 2023-05-18 VITALS — BP 137/87 | HR 78 | Temp 98.1°F | Ht 61.0 in | Wt 259.0 lb

## 2023-05-18 DIAGNOSIS — F1290 Cannabis use, unspecified, uncomplicated: Secondary | ICD-10-CM | POA: Diagnosis not present

## 2023-05-18 DIAGNOSIS — F5105 Insomnia due to other mental disorder: Secondary | ICD-10-CM

## 2023-05-18 DIAGNOSIS — F99 Mental disorder, not otherwise specified: Secondary | ICD-10-CM | POA: Diagnosis not present

## 2023-05-18 DIAGNOSIS — F251 Schizoaffective disorder, depressive type: Secondary | ICD-10-CM | POA: Diagnosis not present

## 2023-05-18 DIAGNOSIS — F411 Generalized anxiety disorder: Secondary | ICD-10-CM | POA: Diagnosis not present

## 2023-05-18 DIAGNOSIS — F4310 Post-traumatic stress disorder, unspecified: Secondary | ICD-10-CM | POA: Diagnosis not present

## 2023-05-18 MED ORDER — ARIPIPRAZOLE 5 MG PO TABS
5.0000 mg | ORAL_TABLET | Freq: Every day | ORAL | 1 refills | Status: DC
Start: 2023-05-18 — End: 2023-06-29

## 2023-05-18 MED ORDER — SERTRALINE HCL 50 MG PO TABS
ORAL_TABLET | ORAL | 1 refills | Status: DC
Start: 2023-05-18 — End: 2023-06-29

## 2023-05-18 MED ORDER — HYDROXYZINE HCL 25 MG PO TABS: 25.0000 mg | ORAL_TABLET | Freq: Every evening | ORAL | 1 refills | Status: DC | PRN

## 2023-05-19 ENCOUNTER — Encounter (HOSPITAL_COMMUNITY): Payer: Self-pay | Admitting: Physician Assistant
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
## 2023-06-06 NOTE — Progress Notes (Unsigned)
  SUBJECTIVE:   CHIEF COMPLAINT / HPI:   Presents today requesting Depo.  Last received on 02/28/2023.  PERTINENT  PMH / PSH: PTSD  OBJECTIVE:  There were no vitals taken for this visit. ***  ASSESSMENT/PLAN:   Assessment & Plan  No follow-ups on file. Shelby Mattocks, DO 06/06/2023, 6:44 PM PGY-3, [facility] {    This will disappear when note is signed, click to select method of visit    :1}

## 2023-06-07 ENCOUNTER — Ambulatory Visit (INDEPENDENT_AMBULATORY_CARE_PROVIDER_SITE_OTHER): Payer: 59 | Admitting: Student

## 2023-06-07 VITALS — BP 122/86 | HR 85 | Wt 258.0 lb

## 2023-06-07 DIAGNOSIS — Z3042 Encounter for surveillance of injectable contraceptive: Secondary | ICD-10-CM | POA: Diagnosis not present

## 2023-06-07 LAB — POCT URINE PREGNANCY: Preg Test, Ur: NEGATIVE

## 2023-06-07 MED ORDER — MEDROXYPROGESTERONE ACETATE 150 MG/ML IM SUSP
150.0000 mg | Freq: Once | INTRAMUSCULAR | Status: AC
Start: 2023-06-07 — End: 2023-06-07
  Administered 2023-06-07: 150 mg via INTRAMUSCULAR

## 2023-06-07 NOTE — Progress Notes (Signed)
 Patient here today for Depo Provera injection.  Depo given in LD today.  Site unremarkable & patient tolerated injection.    Next injection due 08/23/23-09/06/23.  Reminder card given.    Veronda Prude, RN

## 2023-06-07 NOTE — Patient Instructions (Signed)
 The best site for contraception methods is actually the CDC.  I have attached the link below LatePreviews.co.uk  You received the Depo today.  Because you have been receiving it for about 2 years, I do want you to consider switching your birth control method to not put you at risk for osteoporosis.

## 2023-06-08 ENCOUNTER — Ambulatory Visit (INDEPENDENT_AMBULATORY_CARE_PROVIDER_SITE_OTHER): Payer: 59 | Admitting: Mental Health

## 2023-06-08 DIAGNOSIS — F251 Schizoaffective disorder, depressive type: Secondary | ICD-10-CM

## 2023-06-08 DIAGNOSIS — F4310 Post-traumatic stress disorder, unspecified: Secondary | ICD-10-CM

## 2023-06-08 NOTE — Progress Notes (Signed)
 THERAPIST PROGRESS NOTE Virtual Visit via Video Note  I connected with Anna Moses on 06/08/23 at  2:00 PM EST by a video enabled telemedicine application and verified that I am speaking with the correct person using two identifiers.  Location: Patient: home address on file Provider: office   I discussed the limitations of evaluation and management by telemedicine and the availability of in person appointments. The patient expressed understanding and agreed to proceed.  I discussed the assessment and treatment plan with the patient. The patient was provided an opportunity to ask questions and all were answered. The patient agreed with the plan and demonstrated an understanding of the instructions.   The patient was advised to call back or seek an in-person evaluation if the symptoms worsen or if the condition fails to improve as anticipated.  I provided 41 minutes of non-face-to-face time during this encounter.   Dorris Singh, [facility]   Session Time: 2:10 pm ( 41 minutes)  Participation Level: Active  Behavioral Response: CasualAlertEuthymic  Type of Therapy: Individual Therapy  Treatment Goals addressed: STG: "Feel comfortable outside again."Anna Moses will increase level of functioning AEB daily medication compliance,development of x 3 effective coping skills for sxs with ability to engage in community x 1 weekly within the next 90 days.    ProgressTowards Goals: Progressing  Interventions: Strength-based and Supportive  Summary: Anna Moses is a 48 y.o. female who presents with dx of schizoaffective disorder, depressive type, generalized anxiety and cannabis use. Anna Moses presents for therapy session alert and oriented; mood and affect stable; euthymic. Shares to be doing well and denies concerns for depression or psychotic sxs, " I am doing great. I am happy." Anna Moses continues to live with her daughter and for this to be going well. Shares hopeful of ability  to get her own place again and daughter does not want her to leave. Shares to be getting out of the house more and has started to drive from one daughter home to the other. Has learned "boots on the ground" dance and has plans of going on a cruise in June. Notes only compliant to be lonely at times and desires a female partner and worry for sister who uses substances and shared with with her to have cancer. Shares ongoing increasing anxiety in around others, "They are talking about me and looking at me." Receptive of working to challenge those thoughts and inability to mind read. Agrees to work to challenge thoughts in social settings and will start to explore activities in the community she is interested in, " I want to take a pottery class." Denies safety concerns. Progress with goals, medication compliant, use of coping skills as needed and attending community at bi-weekly rate.  Suicidal/Homicidal: Nowithout intent/plan  Therapist Response: Therapist engaged Anna Moses in therapy session. Completed check in and assessed fr current level of stressors, sxs management and level of functioning. Provided safe space for Anna Moses to shares thoughts and feelings on current events and provided supportive feedback. Explored distortions in thoughts and engaged in guided discovery challenging mind reading assumptions.  Explored difference in fact vs. Feelings. Engaged in discussion of healthy relationship dynamics. Explored for engagement in self care and working to engage in activities in community in which she enjoys. Reviewed session and provided follow up.  Plan: Return again in  x 6 weeks.  Diagnosis: Schizoaffective disorder, depressive type (HCC)  PTSD (post-traumatic stress disorder)  Collaboration of Care: Other None  Patient/Guardian was advised Release of Information must  be obtained prior to any record release in order to collaborate their care with an outside provider. Patient/Guardian was advised if they  have not already done so to contact the registration department to sign all necessary forms in order for [place] to release information regarding their care.   Consent: Patient/Guardian gives verbal consent for treatment and assignment of benefits for services provided during this visit. Patient/Guardian expressed understanding and agreed to proceed.   Stephan Minister Hendron, [facility] 06/08/2023

## 2023-06-29 ENCOUNTER — Ambulatory Visit (INDEPENDENT_AMBULATORY_CARE_PROVIDER_SITE_OTHER): Payer: 59 | Admitting: Physician Assistant

## 2023-06-29 ENCOUNTER — Encounter (HOSPITAL_COMMUNITY): Payer: Self-pay | Admitting: Physician Assistant

## 2023-06-29 VITALS — BP 131/77 | HR 89 | Temp 98.0°F | Ht 61.0 in | Wt 259.4 lb

## 2023-06-29 DIAGNOSIS — F4310 Post-traumatic stress disorder, unspecified: Secondary | ICD-10-CM

## 2023-06-29 DIAGNOSIS — F99 Mental disorder, not otherwise specified: Secondary | ICD-10-CM | POA: Diagnosis not present

## 2023-06-29 DIAGNOSIS — F1290 Cannabis use, unspecified, uncomplicated: Secondary | ICD-10-CM

## 2023-06-29 DIAGNOSIS — F411 Generalized anxiety disorder: Secondary | ICD-10-CM | POA: Diagnosis not present

## 2023-06-29 DIAGNOSIS — F251 Schizoaffective disorder, depressive type: Secondary | ICD-10-CM | POA: Diagnosis not present

## 2023-06-29 DIAGNOSIS — F5105 Insomnia due to other mental disorder: Secondary | ICD-10-CM

## 2023-06-29 MED ORDER — ARIPIPRAZOLE 5 MG PO TABS
5.0000 mg | ORAL_TABLET | Freq: Every day | ORAL | 2 refills | Status: DC
Start: 2023-06-29 — End: 2023-08-09

## 2023-06-29 MED ORDER — SERTRALINE HCL 100 MG PO TABS
100.0000 mg | ORAL_TABLET | Freq: Every day | ORAL | 2 refills | Status: DC
Start: 2023-06-29 — End: 2023-08-09

## 2023-06-29 NOTE — Progress Notes (Signed)
 BH MD/PA/NP OP Progress Note  06/29/2023 7:15 PM Anna Moses  MRN:  [id number]  Chief Complaint:  Chief Complaint  Patient presents with   Follow-up   Medication Refill   HPI:   Anna Moses. Oquin is a 48 year old female with a past psychiatric history significant for for schizoaffective disorder (depressive type), generalized anxiety disorder, and PTSD presents to [facility], accompanied by her goddaughter Courtney Heys), for follow-up and medication management.  Patient is currently being managed on the following psychiatric medications:  Abilify 5 mg daily Sertraline 100 mg daily Hydroxyzine 25 mg at bedtime as needed  Patient reports that her sleep has improved since the last encounter.  Patient reports that she receives on average 5 to 6 hours of sleep, but she does wake up throughout the night.  She also states that she will occasionally lie awake for some time before falling asleep.  Patient reports that her mood has been up and down.  She endorses depression and rates her depression a 4 out of 10 with 10 being most severe.  Patient endorses depressive episodes every other day.  Patient endorses the following depressive symptoms: low mood, lack of motivation, irritability, feelings of guilt/worthlessness, and hopelessness.  Patient also states that her depression influences her pain.  Patient reports that her anxiety is manageable when by herself but elevated when around large crowds.  A PHQ-9 screen was performed with the patient scoring a 19.  A GAD-7 screen was also performed with the patient scoring a 17.  Patient is alert and oriented x 4, calm, cooperative, and fully engaged in conversation during the encounter.  Patient endorses good mood.  Patient exhibits depressed mood with appropriate affect.  Patient denies suicidal or homicidal ideations.  She further denies active auditory or visual hallucinations but does state that she  experiences seeing shadows go across the floor a week ago.  Patient does not appear to be responding to internal/external stimuli.  Patient endorses fair sleep and receives on average 5 to 6 hours of intermittent sleep.  Patient endorses fair appetite and eats on average 1-2 meals per day.  Patient denies alcohol consumption.  Patient endorses tobacco use and smokes on average a pack every 2 days.  Patient endorses illicit drug use in the form of THC gummies.  Visit Diagnosis:    ICD-10-CM   1. Insomnia due to other mental disorder  F51.05 Ambulatory referral to Sleep Studies   F99     2. Schizoaffective disorder, depressive type (HCC)  F25.1 sertraline (ZOLOFT) 100 MG tablet    ARIPiprazole (ABILIFY) 5 MG tablet    3. Generalized anxiety disorder  F41.1 sertraline (ZOLOFT) 100 MG tablet    4. PTSD (post-traumatic stress disorder)  F43.10 sertraline (ZOLOFT) 100 MG tablet    5. Marijuana use  F12.90       Past Psychiatric History:  Patient endorses a past psychiatric history significant for schizophrenia   Patient endorses a past history of hospitalization due to mental health.  Patient reports that she was last hospitalized a few years ago.   Patient endorses a past history of suicide attempt stating that she last attempted several years ago.   Patient denies a past history of homicide attempt  Past Medical History:  Past Medical History:  Diagnosis Date   Asthma    Bronchitis    Depression    Obesity    Schizophrenia (HCC)     Past Surgical History:  Procedure Laterality Date  BREAST BIOPSY Left 09/13/2021   BREAST DUCTAL SYSTEM EXCISION Left 11/17/2022   Procedure: LEFT BREAST CENTRAL DUCT EXCISION;  Surgeon: Harriette Bouillon, MD;  Location: [facility];  Service: General;  Laterality: Left;   CESAREAN SECTION     CHONDROPLASTY Left 06/02/2022   Procedure: CHONDROPLASTY;  Surgeon: Bjorn Pippin, MD;  Location: [facility];  Service: Orthopedics;   Laterality: Left;   COLONOSCOPY  06/27/2019   KNEE ARTHROSCOPY WITH LATERAL RELEASE Left 06/02/2022   Procedure: KNEE ARTHROSCOPY WITH LATERAL RELEASE;  Surgeon: Bjorn Pippin, MD;  Location: [facility];  Service: Orthopedics;  Laterality: Left;   KNEE ARTHROSCOPY WITH MEDIAL MENISECTOMY Left 06/02/2022   Procedure: KNEE ARTHROSCOPY WITH MEDIAL MENISECTOMY;  Surgeon: Bjorn Pippin, MD;  Location: [facility];  Service: Orthopedics;  Laterality: Left;   UPPER GASTROINTESTINAL ENDOSCOPY  06/27/2019    Family Psychiatric History:  Mother - unsure of diagnosis Sister - unsure of diagnosis Brother - unsure of diagnosis   Family history of suicide attempt: Patient denies Family history of homicide attempt: Patient denies Family history of substance abuse: Patient reports that her mother, father, brother, and sister abused substances  Family History:  Family History  Problem Relation Age of Onset   Crohn's disease Mother    Liver disease Father    Clotting disorder Father    Liver disease Brother    Colon cancer Neg Hx    Esophageal cancer Neg Hx    Rectal cancer Neg Hx    Stomach cancer Neg Hx    Breast cancer Neg Hx     Social History:  Social History   Socioeconomic History   Marital status: Single    Spouse name: Not on file   Number of children: 4   Years of education: Not on file   Highest education level: Not on file  Occupational History   Occupation: stay at home mother  Tobacco Use   Smoking status: Some Days    Current packs/day: 0.50    Types: Cigarettes    Passive exposure: Current   Smokeless tobacco: Never  Vaping Use   Vaping status: Never Used  Substance and Sexual Activity   Alcohol use: Not Currently   Drug use: Not Currently    Types: Marijuana    Comment: occ   Sexual activity: Not Currently    Birth control/protection: None  Other Topics Concern   Not on file  Social History Narrative   Not on file   Social  Drivers of Health   Financial Resource Strain: Medium Risk (10/25/2022)   Overall Financial Resource Strain (CARDIA)    Difficulty of Paying Living Expenses: Somewhat hard  Food Insecurity: No Food Insecurity (10/25/2022)   Hunger Vital Sign    Worried About Running Out of Food in the Last Year: Never true    Ran Out of Food in the Last Year: Never true  Transportation Needs: Unmet Transportation Needs (10/25/2022)   PRAPARE - Transportation    Lack of Transportation (Medical): Yes    Lack of Transportation (Non-Medical): Yes  Physical Activity: Inactive (10/25/2022)   Exercise Vital Sign    Days of Exercise per Week: 0 days    Minutes of Exercise per Session: 0 min  Stress: Stress Concern Present (10/25/2022)   [employer] of Occupational Health - Occupational Stress Questionnaire    Feeling of Stress : Very much  Social Connections: Socially Isolated (10/25/2022)   Social Connection and Isolation  Panel [NHANES]    Frequency of Communication with Friends and Family: More than three times a week    Frequency of Social Gatherings with Friends and Family: Never    Attends Religious Services: Never    Database administrator or Organizations: No    Attends Engineer, structural: Never    Marital Status: Never married    Allergies: No Known Allergies  Metabolic Disorder Labs: Lab Results  Component Value Date   HGBA1C 5.4 08/20/2019   No results found for: "PROLACTIN" No results found for: "CHOL", "TRIG", "HDL", "CHOLHDL", "VLDL", "LDLCALC" No results found for: "TSH"  Therapeutic Level Labs: No results found for: "LITHIUM" No results found for: "VALPROATE" No results found for: "CBMZ"  Current Medications: Current Outpatient Medications  Medication Sig Dispense Refill   albuterol (VENTOLIN HFA) 108 (90 Base) MCG/ACT inhaler Inhale 1-2 puffs into the lungs every 6 (six) hours as needed for wheezing or shortness of breath. 8 g 0   ARIPiprazole (ABILIFY) 5 MG tablet  Take 1 tablet (5 mg total) by mouth daily. 30 tablet 2   clotrimazole-betamethasone (LOTRISONE) cream Apply to affected area on back 2 times daily until improved 15 g 0   estradiol cypionate (DEPO-ESTRADIOL) 5 MG/ML injection Inject into the muscle every 28 (twenty-eight) days.     famotidine (PEPCID) 20 MG tablet Take 1 tablet (20 mg total) by mouth 2 (two) times daily. 30 tablet 0   hydrOXYzine (ATARAX) 25 MG tablet Take 1 tablet (25 mg total) by mouth at bedtime as needed. 30 tablet 1   mupirocin ointment (BACTROBAN) 2 % Apply 1 Application topically 2 (two) times daily. To affected area that is infected till better 22 g 0   omeprazole (PRILOSEC) 20 MG capsule Take 1 capsule (20 mg total) by mouth daily for 14 days. 14 capsule 0   oxyCODONE (ROXICODONE) 5 MG immediate release tablet Take 1 tablet (5 mg total) by mouth every 6 (six) hours as needed for severe pain. 12 tablet 0   promethazine (PHENERGAN) 25 MG suppository Place 1 suppository (25 mg total) rectally every 6 (six) hours as needed for nausea or vomiting. 12 each 0   sertraline (ZOLOFT) 100 MG tablet Take 1 tablet (100 mg total) by mouth daily. 30 tablet 2   No current facility-administered medications for this visit.     Musculoskeletal: Strength & Muscle Tone: within normal limits Gait & Station: normal Patient leans: N/A  Psychiatric Specialty Exam: Review of Systems  Psychiatric/Behavioral:  Positive for dysphoric mood, hallucinations and sleep disturbance. Negative for decreased concentration, self-injury and suicidal ideas. The patient is nervous/anxious. The patient is not hyperactive.     Blood pressure 131/77, pulse 89, temperature 98 F (36.7 C), temperature source Oral, height 5\' 1"  (1.549 m), weight 259 lb 6.4 oz (117.7 kg), SpO2 99%.Body mass index is 49.01 kg/m.  General Appearance: Casual  Eye Contact:  Good  Speech:  Clear and Coherent and Normal Rate  Volume:  Normal  Mood:  Anxious and Depressed  Affect:   Congruent  Thought Process:  Coherent, Goal Directed, and Descriptions of Associations: Intact  Orientation:  Full (Time, Place, and Person)  Thought Content: Hallucinations: Auditory and Paranoid Ideation   Suicidal Thoughts:  No  Homicidal Thoughts:  No  Memory:  Immediate;   Good Recent;   Fair Remote;   Fair  Judgement:  Good  Insight:  Present  Psychomotor Activity:  Normal  Concentration:  Concentration: Good and Attention Span: Good  Recall:  Jennelle Human of Knowledge: Good  Language: Good  Akathisia:  No  Handed:  Right  AIMS (if indicated): not done  Assets:  Communication Skills Desire for Improvement Housing Social Support  ADL's:  Intact  Cognition: WNL  Sleep:  Poor   Screenings: GAD-7    Flowsheet Row Clinical Support from 06/29/2023 in [facility] Clinical Support from 05/18/2023 in [facility] Counselor from 04/18/2023 in [facility] Video Visit from 02/08/2023 in [facility] Office Visit from 12/28/2022 in [facility]  Total GAD-7 Score 17 17 11 17 21       PHQ2-9    Flowsheet Row Clinical Support from 06/29/2023 in [facility] Office Visit from 06/07/2023 in [facility] [place]. [facility] Clinical Support from 05/18/2023 in [facility] Counselor from 04/18/2023 in [facility] Office Visit from 03/13/2023 in [facility] [place]. [facility]  PHQ-2 Total Score 4 1 4 2 2   PHQ-9 Total Score 19 9 20 12 13       Flowsheet Row Clinical Support from 06/29/2023 in [facility] Clinical Support from 05/18/2023 in [facility] Video Visit from 02/08/2023 in [facility]  C-SSRS RISK CATEGORY  Moderate Risk Moderate Risk Moderate Risk        Assessment and Plan:   Anna Moses. Anna Moses is a 48 year old female with a past psychiatric history significant for for schizoaffective disorder (depressive type), generalized anxiety disorder, and PTSD presents to [facility], accompanied by her granddaughter Courtney Heys), for follow-up and medication management.  Patient presents today encounter stating that her sleep is improved since the last session.  She reports that she receives on average 5 to 6 hours of sleep per night however, she endorses waking up throughout the night.  Patient continues to endorse some depression as well as anxiety especially when around crowds.  Patient denies active auditory or visual hallucinations but states that she did see a shadow across the floor a week ago.  Despite experiencing some visual hallucinations, depression, and anxiety, patient would like to continue taking her medications as prescribed.  Patient's medications to be e-prescribed to pharmacy of choice.  Patient to be referred for sleep study due to her ongoing sleep issues.  Due to patient's use of Abilify, labs to be obtained during her next encounter.  Provider encouraged marijuana cessation following the conclusion of the encounter.  Collaboration of Care: Collaboration of Care: Medication Management AEB provider managing patient's psychiatric medications, Primary Care Provider AEB patient being seen by a family medicine provider, Psychiatrist AEB patient being followed by a mental health provider at this facility, and Referral or follow-up with counselor/therapist AEB patient being seen by a licensed clinical social worker at this facility  Patient/Guardian was advised Release of Information must be obtained prior to any record release in order to collaborate their care with an outside provider. Patient/Guardian was advised if they have not already done so to  contact the registration department to sign all necessary forms in order for [place] to release information regarding their care.   Consent: Patient/Guardian gives verbal consent for treatment and assignment of benefits for services provided during this visit. Patient/Guardian expressed understanding and agreed to proceed.  1. Insomnia due to other mental disorder (Primary)  - Ambulatory referral to Sleep Studies  2. Schizoaffective disorder, depressive type (HCC)  - sertraline (ZOLOFT) 100 MG tablet; Take 1 tablet (100 mg total) by mouth daily.  Dispense: 30 tablet; Refill: 2 - ARIPiprazole (ABILIFY) 5 MG tablet; Take 1 tablet (5 mg total) by mouth daily.  Dispense: 30 tablet; Refill: 2  3. Generalized anxiety disorder  - sertraline (ZOLOFT) 100 MG tablet; Take 1 tablet (100 mg total) by mouth daily.  Dispense: 30 tablet; Refill: 2  4. PTSD (post-traumatic stress disorder)  - sertraline (ZOLOFT) 100 MG tablet; Take 1 tablet (100 mg total) by mouth daily.  Dispense: 30 tablet; Refill: 2  5. Marijuana use  Patient to follow up in 2 months Provider spent a total of 19 minutes with the patient/reviewing patient's chart  Meta Hatchet, PA 06/29/2023, 7:15 PM

## 2023-08-02 ENCOUNTER — Ambulatory Visit (HOSPITAL_COMMUNITY): Payer: 59 | Admitting: Mental Health

## 2023-08-02 ENCOUNTER — Ambulatory Visit
Admission: EM | Admit: 2023-08-02 | Discharge: 2023-08-02 | Disposition: A | Discharge status: Home / Self Care | Attending: Family Medicine | Admitting: Family Medicine

## 2023-08-02 ENCOUNTER — Encounter (HOSPITAL_COMMUNITY): Payer: Self-pay

## 2023-08-02 DIAGNOSIS — R051 Acute cough: Secondary | ICD-10-CM | POA: Diagnosis not present

## 2023-08-02 DIAGNOSIS — B349 Viral infection, unspecified: Secondary | ICD-10-CM

## 2023-08-02 DIAGNOSIS — H6121 Impacted cerumen, right ear: Secondary | ICD-10-CM | POA: Diagnosis not present

## 2023-08-02 DIAGNOSIS — J4521 Mild intermittent asthma with (acute) exacerbation: Secondary | ICD-10-CM

## 2023-08-02 DIAGNOSIS — F251 Schizoaffective disorder, depressive type: Secondary | ICD-10-CM

## 2023-08-02 DIAGNOSIS — H6993 Unspecified Eustachian tube disorder, bilateral: Secondary | ICD-10-CM | POA: Diagnosis not present

## 2023-08-02 DIAGNOSIS — R062 Wheezing: Secondary | ICD-10-CM

## 2023-08-02 LAB — POC COVID19/FLU A&B COMBO
Covid Antigen, POC: NEGATIVE
Influenza A Antigen, POC: NEGATIVE
Influenza B Antigen, POC: NEGATIVE

## 2023-08-02 MED ORDER — PROMETHAZINE-DM 6.25-15 MG/5ML PO SYRP
5.0000 mL | ORAL_SOLUTION | Freq: Four times a day (QID) | ORAL | 0 refills | Status: AC | PRN
Start: 2023-08-02 — End: ?

## 2023-08-02 MED ORDER — IPRATROPIUM-ALBUTEROL 0.5-2.5 (3) MG/3ML IN SOLN
3.0000 mL | Freq: Once | RESPIRATORY_TRACT | Status: AC
  Administered 2023-08-02: 3 mL via RESPIRATORY_TRACT

## 2023-08-02 MED ORDER — FLUTICASONE PROPIONATE 50 MCG/ACT NA SUSP
1.0000 | Freq: Every day | NASAL | 0 refills | Status: AC
Start: 2023-08-02 — End: ?

## 2023-08-02 MED ORDER — PREDNISONE 20 MG PO TABS
40.0000 mg | ORAL_TABLET | Freq: Every day | ORAL | 0 refills | Status: AC
Start: 2023-08-02 — End: 2023-08-07

## 2023-08-02 NOTE — Progress Notes (Signed)
   Pt connected to tele-therapy session and reported to not feel well with having presented to ED earlier today. Therapist agreed to reschedule due to illness 4/29 @ 4pm    Carmel Chimes [employer], [facility] 08/02/2023

## 2023-08-02 NOTE — Discharge Instructions (Addendum)
 You have tested negative for COVID and flu.  Please continue albuterol  inhaler as needed for wheezing or shortness of breath.  Start prednisone  daily for 5 days.  Start Flonase  daily to help drain the fluid from behind your ears.  You may take Promethazine  DM as needed for your cough.  Please note this medication will make you drowsy.  Do not drink alcohol or drive while on this medication.  Lots of rest and fluids.  Please follow-up with your PCP in 2 to 3 days for recheck.  Please go to the ER if you develop any worsening symptoms.  Hope you feel better soon!

## 2023-08-02 NOTE — ED Triage Notes (Signed)
 Pt present to UC with cold chills, bilateral ear pain/fullness and chest congestion x 4 days. Pt has taken OTC cold medicine for relief. States she has been able to feel better.

## 2023-08-02 NOTE — ED Provider Notes (Signed)
 [facility]    CSN: [id number] Arrival date & time: 08/02/23  0805      History   Chief Complaint Chief Complaint  Patient presents with   Ear Pain   Chills    HPI Anna Moses is a 48 y.o. female  presents for evaluation of URI symptoms for 4 days. Patient reports associated symptoms of cough, congestion, ear pain/fullness, sore throat, wheezing/shortness of breath. Denies N/V/D, fevers, body aches. Patient does have a hx of asthma.  Has an albuterol  inhaler that she has been using with temporary improvement.  Patient is an active smoker.   Reports no sick contacts.  Pt has taken cold medicine OTC for symptoms. Pt has no other concerns at this time.   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 pylori infection 07/24/2019   Seasonal allergies 07/24/2019   Dysphagia 06/03/2019   Nausea and vomiting 06/03/2019   Liver lesion 06/03/2019    Past Surgical History:  Procedure Laterality Date   BREAST BIOPSY Left 09/13/2021   BREAST DUCTAL SYSTEM EXCISION Left 11/17/2022   Procedure: LEFT BREAST CENTRAL DUCT EXCISION;  Surgeon: Sim Dryer, MD;  Location: [facility];  Service: General;  Laterality: Left;    CESAREAN SECTION     CHONDROPLASTY Left 06/02/2022   Procedure: CHONDROPLASTY;  Surgeon: Micheline Ahr, MD;  Location: [facility];  Service: Orthopedics;  Laterality: Left;   COLONOSCOPY  06/27/2019   KNEE ARTHROSCOPY WITH LATERAL RELEASE Left 06/02/2022   Procedure: KNEE ARTHROSCOPY WITH LATERAL RELEASE;  Surgeon: Micheline Ahr, MD;  Location: [facility];  Service: Orthopedics;  Laterality: Left;   KNEE ARTHROSCOPY WITH MEDIAL MENISECTOMY Left 06/02/2022   Procedure: KNEE ARTHROSCOPY WITH MEDIAL MENISECTOMY;  Surgeon: Micheline Ahr, MD;  Location: [facility];  Service: Orthopedics;  Laterality: Left;   UPPER GASTROINTESTINAL ENDOSCOPY  06/27/2019    OB History   No obstetric history on file.      Home Medications    Prior to Admission medications   Medication Sig Start Date End Date Taking? Authorizing Provider  fluticasone  (FLONASE ) 50 MCG/ACT nasal spray Place 1 spray into both nostrils daily. 08/02/23  Yes Raed Schalk, Jodi R, NP  predniSONE  (DELTASONE ) 20 MG tablet Take 2 tablets (40 mg total) by mouth daily with breakfast for 5 days. 08/02/23 08/07/23 Yes Angad Nabers, Jodi R, NP  promethazine -dextromethorphan (PROMETHAZINE -DM) 6.25-15 MG/5ML syrup Take 5 mLs by mouth 4 (four) times daily as needed for cough. 08/02/23  Yes Mckenzi Buonomo, Jodi R, NP  albuterol  (VENTOLIN  HFA) 108 (90 Base) MCG/ACT inhaler Inhale 1-2 puffs into the lungs every 6 (six) hours as needed for wheezing or shortness of breath. 05/18/22   Stanhope,  Danny Dye, FNP  ARIPiprazole  (ABILIFY ) 5 MG tablet Take 1 tablet (5 mg total) by mouth daily. 06/29/23 06/28/24  Nwoko, Uchenna E, PA  clotrimazole -betamethasone  (LOTRISONE ) cream Apply to affected area on back 2 times daily until improved 12/01/22   Banister, Pamela K, MD  estradiol cypionate (DEPO-ESTRADIOL) 5 MG/ML injection Inject into the muscle every 28 (twenty-eight) days.    [provider]  famotidine  (PEPCID ) 20 MG tablet Take  1 tablet (20 mg total) by mouth 2 (two) times daily. 06/17/22   Zackowski, Scott, MD  hydrOXYzine  (ATARAX ) 25 MG tablet Take 1 tablet (25 mg total) by mouth at bedtime as needed. 05/18/23   Nwoko, Uchenna E, PA  mupirocin  ointment (BACTROBAN ) 2 % Apply 1 Application topically 2 (two) times daily. To affected area that is infected till better 12/01/22   Ann Keto, MD  omeprazole  (PRILOSEC) 20 MG capsule Take 1 capsule (20 mg total) by mouth daily for 14 days. 06/17/22 07/01/22  Rising, Ivette Marks, PA-C  oxyCODONE  (ROXICODONE ) 5 MG immediate release tablet Take 1 tablet (5 mg total) by mouth every 6 (six) hours as needed for severe pain. 12/01/22   Ann Keto, MD  promethazine  (PHENERGAN ) 25 MG suppository Place 1 suppository (25 mg total) rectally every 6 (six) hours as needed for nausea or vomiting. 06/21/22   Schutt, Coni Deep, PA-C  sertraline  (ZOLOFT ) 100 MG tablet Take 1 tablet (100 mg total) by mouth daily. 06/29/23 06/28/24  Nwoko, Uchenna E, PA  diphenhydrAMINE  (BENADRYL ) 25 MG tablet Take 1 tablet (25 mg total) by mouth every 6 (six) hours. Patient not taking: Reported on 12/12/2018 10/21/17 05/30/19  Wynetta Heckle, MD  Ibuprofen -diphenhydrAMINE  Cit (ADVIL  PM) 200-38 MG TABS Take 2 tablets by mouth 2 (two) times daily as needed (headaches).  05/30/19  [provider]    Family History Family History  Problem Relation Age of Onset   Crohn's disease Mother    Liver disease Father    Clotting disorder Father    Liver disease Brother    Colon cancer Neg Hx    Esophageal cancer Neg Hx    Rectal cancer Neg Hx    Stomach cancer Neg Hx    Breast cancer Neg Hx     Social History Social History   Tobacco Use   Smoking status: Some Days    Current packs/day: 0.50    Types: Cigarettes    Passive exposure: Current   Smokeless tobacco: Never  Vaping Use   Vaping status: Never Used  Substance Use Topics   Alcohol use: Not Currently   Drug use: Not Currently    Types:  Marijuana    Comment: occ     Allergies   Patient has no known allergies.   Review of Systems Review of Systems  HENT:  Positive for congestion, ear pain and sore throat.   Respiratory:  Positive for cough, shortness of breath and wheezing.      Physical Exam Triage Vital Signs ED Triage Vitals  Encounter Vitals Group     BP 08/02/23 0914 (!) 136/96     Systolic BP Percentile --      Diastolic BP Percentile --      Pulse Rate 08/02/23 0912 88     Resp 08/02/23 0912 17     Temp 08/02/23 0914 99.2 F (37.3 C)     Temp Source 08/02/23 0914 Oral     SpO2 08/02/23 0912 98 %     Weight --  Height --      Head Circumference --      Peak Flow --      Pain Score 08/02/23 0912 8     Pain Loc --      Pain Education --      Exclude from Growth Chart --    No data found.  Updated Vital Signs BP (!) 136/96   Pulse 88   Temp 99.2 F (37.3 C) (Oral)   Resp 17   LMP  (LMP Unknown)   SpO2 98%   Visual Acuity Right Eye Distance:   Left Eye Distance:   Bilateral Distance:    Right Eye Near:   Left Eye Near:    Bilateral Near:     Physical Exam Vitals and nursing note reviewed.  Constitutional:      General: She is not in acute distress.    Appearance: She is well-developed. She is not ill-appearing.  HENT:     Head: Normocephalic and atraumatic.     Right Ear: Ear canal normal. A middle ear effusion is present. There is impacted cerumen.     Left Ear: Ear canal normal. A middle ear effusion is present. There is no impacted cerumen. Tympanic membrane is not erythematous.     Ears:     Comments: Right TM visible with effusion without erythema and right canal within normal limits after irrigation    Nose: Congestion present.     Mouth/Throat:     Mouth: Mucous membranes are moist.     Pharynx: Oropharynx is clear. Uvula midline. Posterior oropharyngeal erythema present.     Tonsils: No tonsillar exudate or tonsillar abscesses.  Eyes:     Conjunctiva/sclera:  Conjunctivae normal.     Pupils: Pupils are equal, round, and reactive to light.  Cardiovascular:     Rate and Rhythm: Normal rate and regular rhythm.     Heart sounds: Normal heart sounds.  Pulmonary:     Effort: Pulmonary effort is normal.     Breath sounds: Wheezing present.     Comments: Mild expiratory wheeze bilateral bases Musculoskeletal:     Cervical back: Normal range of motion and neck supple.  Lymphadenopathy:     Cervical: No cervical adenopathy.  Skin:    General: Skin is warm and dry.  Neurological:     General: No focal deficit present.     Mental Status: She is alert and oriented to person, place, and time.  Psychiatric:        Mood and Affect: Mood normal.        Behavior: Behavior normal.      UC Treatments / Results  Labs (all labs ordered are listed, but only abnormal results are displayed) Labs Reviewed  POC COVID19/FLU A&B COMBO    EKG   Radiology No results found.  Procedures Procedures (including critical care time)  Medications Ordered in UC Medications  ipratropium-albuterol  (DUONEB) 0.5-2.5 (3) MG/3ML nebulizer solution 3 mL (3 mLs Nebulization Given 08/02/23 0947)    Initial Impression / Assessment and Plan / UC Course  I have reviewed the triage vital signs and the nursing notes.  Pertinent labs & imaging results that were available during my care of the patient were reviewed by me and considered in my medical decision making (see chart for details).     Reviewed exam and symptoms with patient.  No red flags.  Negative rapid flu and COVID testing.  Patient reports improvement after nebulizer and wheezing did resolve.  Discussed  viral illness/eustachian tube dysfunction with asthma exacerbation.  Will start prednisone  daily.  Flonase  daily.  Promethazine  DM as needed for cough, side effect profile reviewed.  Patient to continue butyryl inhaler as needed.  Discussed rest fluids and PCP follow-up 2 to 3 days for recheck.  ER precautions  reviewed and patient verbalized understanding. Final Clinical Impressions(s) / UC Diagnoses   Final diagnoses:  Wheezing  Impacted cerumen of right ear  Acute cough  Viral illness  Mild intermittent asthma with acute exacerbation  Eustachian tube dysfunction, bilateral     Discharge Instructions      You have tested negative for COVID and flu.  Please continue albuterol  inhaler as needed for wheezing or shortness of breath.  Start prednisone  daily for 5 days.  Start Flonase  daily to help drain the fluid from behind your ears.  You may take Promethazine  DM as needed for your cough.  Please note this medication will make you drowsy.  Do not drink alcohol or drive while on this medication.  Lots of rest and fluids.  Please follow-up with your PCP in 2 to 3 days for recheck.  Please go to the ER if you develop any worsening symptoms.  Hope you feel better soon!     ED Prescriptions     Medication Sig Dispense Auth. Provider   predniSONE  (DELTASONE ) 20 MG tablet Take 2 tablets (40 mg total) by mouth daily with breakfast for 5 days. 10 tablet Chauntelle Azpeitia, Jodi R, NP   promethazine -dextromethorphan (PROMETHAZINE -DM) 6.25-15 MG/5ML syrup Take 5 mLs by mouth 4 (four) times daily as needed for cough. 118 mL Nayzeth Altman, Jodi R, NP   fluticasone  (FLONASE ) 50 MCG/ACT nasal spray Place 1 spray into both nostrils daily. 15.8 mL Daril Warga, Jodi R, NP      PDMP not reviewed this encounter.   Alleen Arbour, NP 08/02/23 1018

## 2023-08-02 NOTE — ED Notes (Signed)
 Called patient in lobby for triage. No answer

## 2023-08-08 ENCOUNTER — Ambulatory Visit (INDEPENDENT_AMBULATORY_CARE_PROVIDER_SITE_OTHER): Admitting: Mental Health

## 2023-08-08 ENCOUNTER — Encounter (HOSPITAL_COMMUNITY): Payer: Self-pay

## 2023-08-08 DIAGNOSIS — F251 Schizoaffective disorder, depressive type: Secondary | ICD-10-CM

## 2023-08-08 DIAGNOSIS — F4310 Post-traumatic stress disorder, unspecified: Secondary | ICD-10-CM

## 2023-08-08 NOTE — Progress Notes (Unsigned)
   THERAPIST PROGRESS NOTE  Session Time: 4:03 pm (46 minutes)  Participation Level: Active  Behavioral Response: CasualAlertEuthymic  Type of Therapy: Individual Therapy  Treatment Goals addressed: STG: "Get better with crowds."Anna Moses will increase level of comfort in the community AEB development of coping skills for anxiety with ability to engage in community x 3 weekly for a period of 90 days.   ProgressTowards Goals: Progressing  Interventions: CBT and Supportive  Summary: Anna Moses is a 48 y.o. female who presents with dx of schizoaffective disorder, depressive type, generalized anxiety and cannabis use. Anna Moses presents for therapy session alert and oriented; mood and affect stable; euthymic. Shares to be doing well and denies concerns for depression or psychotic sxs. Shares presence of low mood at times and worry secondary to concern for family members. States for son to be incarcerated, concern for mother's medical health and over all stability and sister to continue to use substances. Notes can put others before herself and is working on doing things in which she enjoys. Shares some positive changes in life with obtaining and car and doing some delivery work on the side; denies for anxiety to interfere with this. Shares to enjoy learning trail ride dances. Shares would like to engage in community at higher rate with attendance to events in the community but shares anxiety is a barrier when around others. Explores with therapist attendance to smaller outings in the community and building up ability to be around large group of othhers. Agrees to annual treatment plan and denies SI/HI. Updated goal.   Suicidal/Homicidal: Nowithout intent/plan  Therapist Response: Therapist engaged Anna Moses in therapy session. Completed check in and assessed fr current level of stressors, sxs management and level of functioning. Provided safe space for Lilliane to shares thoughts and feelings on  current events and provided supportive feedback. Explored level of self-care and supported in processing things in which are in and out of her control. Discouraged internalizing others' concerns and finding balance between being a support to others while taking care of self. Assessed for medication management and reviewed GAD (13; decreased from 17) and PHQ (12 decrease from 19) scores. Explored progress she had made in services and areas in which she would like to see some added growth. Explored engaging in behavioral and exposure work to decrease sxs of anxiety while in crowds. Reviewed treatment plan and provided follow up.   Plan: Return again in  x 7 weeks.  Diagnosis: Schizoaffective disorder, depressive type (HCC)  PTSD (post-traumatic stress disorder)  Collaboration of Care: Other None  Patient/Guardian was advised Release of Information must be obtained prior to any record release in order to collaborate their care with an outside provider. Patient/Guardian was advised if they have not already done so to contact the registration department to sign all necessary forms in order for us  to release information regarding their care.   Consent: Patient/Guardian gives verbal consent for treatment and assignment of benefits for services provided during this visit. Patient/Guardian expressed understanding and agreed to proceed.   Carmel Chimes Kemp, [facility] 08/08/2023

## 2023-08-09 ENCOUNTER — Encounter (HOSPITAL_COMMUNITY): Payer: Self-pay | Admitting: Physician Assistant

## 2023-08-09 ENCOUNTER — Ambulatory Visit (INDEPENDENT_AMBULATORY_CARE_PROVIDER_SITE_OTHER): Admitting: Physician Assistant

## 2023-08-09 VITALS — BP 128/78 | HR 70 | Temp 98.5°F | Ht 61.0 in | Wt 256.3 lb

## 2023-08-09 DIAGNOSIS — F4310 Post-traumatic stress disorder, unspecified: Secondary | ICD-10-CM | POA: Diagnosis not present

## 2023-08-09 DIAGNOSIS — Z79899 Other long term (current) drug therapy: Secondary | ICD-10-CM | POA: Diagnosis not present

## 2023-08-09 DIAGNOSIS — F1290 Cannabis use, unspecified, uncomplicated: Secondary | ICD-10-CM

## 2023-08-09 DIAGNOSIS — F251 Schizoaffective disorder, depressive type: Secondary | ICD-10-CM

## 2023-08-09 DIAGNOSIS — F411 Generalized anxiety disorder: Secondary | ICD-10-CM | POA: Diagnosis not present

## 2023-08-09 DIAGNOSIS — F99 Mental disorder, not otherwise specified: Secondary | ICD-10-CM

## 2023-08-09 DIAGNOSIS — F5105 Insomnia due to other mental disorder: Secondary | ICD-10-CM

## 2023-08-09 MED ORDER — ARIPIPRAZOLE 5 MG PO TABS
5.0000 mg | ORAL_TABLET | Freq: Every day | ORAL | 2 refills | Status: DC
Start: 2023-08-09 — End: 2023-12-19

## 2023-08-09 MED ORDER — SERTRALINE HCL 100 MG PO TABS
100.0000 mg | ORAL_TABLET | Freq: Every day | ORAL | 2 refills | Status: DC
Start: 2023-08-09 — End: 2023-12-19

## 2023-08-09 NOTE — Progress Notes (Signed)
 BH MD/PA/NP OP Progress Note  08/09/2023 9:11 PM Anna Moses  MRN:  [id number]  Chief Complaint:  Chief Complaint  Patient presents with   Follow-up   Medication Refill   HPI:   Anna Moses is a 48 year old female with a past psychiatric history significant for for schizoaffective disorder (depressive type), generalized anxiety disorder, and PTSD presents to [facility] for follow-up and medication management.  Patient is currently being managed on the following psychiatric medications:  Abilify  5 mg daily Sertraline  100 mg daily  Patient presents to the encounter stating that she has been taking her medications regularly.  She denies experiencing any adverse side effects with her current medication regimen.  She denies experiencing any involuntary movements associated with her Abilify  use.  She does report that she has been experiencing cramping thats been going on for roughly a month.  Patient endorses some depression but states that her symptoms have improved.  She reports that she has been going outside more and plans on doing more activities outside of the home.  Patient rates her depression a 1 out of 10 with 10 being most severe.  Patient endorses depressive episodes every other day.  She reports that her depression comes when she is not doing anything and thinking about the past.  Patient endorses the following depressive symptoms: hopelessness, crying spells, lack of motivation, feelings of guilt/worthlessness, decreased energy, and irritability.  A PHQ-9 screen was performed with the patient scoring a 14.  A GAD-7 screen was also performed with the patient scoring a 10.  Patient denies auditory or visual hallucinations.  She does endorse paranoia characterized by the belief that someone is watching her.  She reports that her paranoia occurs at night and when she is outside.  Patient is alert and oriented x 4, calm, cooperative, and  fully engaged in conversation during the encounter.  Patient endorses good mood.  Patient exhibits euthymic mood with appropriate affect.  Patient denies suicidal or homicidal ideations.  She further denies auditory or visual hallucinations and does not appear to be responding to internal/external stimuli.  Patient endorses poor sleep stating that she normally goes to bed at around 10:00 PM but still experiences waking up throughout the night.  She reports that she is still waiting on results from her sleep study.  Patient endorses fair appetite needs on average 1-2 meals per day.  Patient denies alcohol consumption.  Patient endorses tobacco use and smokes on average a pack every 2 days.  Patient endorses illicit drug use in the form of marijuana.  Visit Diagnosis:    ICD-10-CM   1. Marijuana use  F12.90     2. Schizoaffective disorder, depressive type (HCC)  F25.1 sertraline  (ZOLOFT ) 100 MG tablet    ARIPiprazole  (ABILIFY ) 5 MG tablet    3. Generalized anxiety disorder  F41.1 sertraline  (ZOLOFT ) 100 MG tablet    4. PTSD (post-traumatic stress disorder)  F43.10 sertraline  (ZOLOFT ) 100 MG tablet    5. Insomnia due to other mental disorder  F51.05    F99     6. Long term current use of antipsychotic medication  Z79.899 Lipid panel    HgB A1c    Comprehensive metabolic panel with GFR    CBC with Differential/Platelet    CBC with Differential/Platelet    Comprehensive metabolic panel with GFR    HgB A1c    Lipid panel      Past Psychiatric History:  Patient endorses a past psychiatric  history significant for schizophrenia   Patient endorses a past history of hospitalization due to mental health.  Patient reports that she was last hospitalized a few years ago.   Patient endorses a past history of suicide attempt stating that she last attempted several years ago.   Patient denies a past history of homicide attempt  Past Medical History:  Past Medical History:  Diagnosis Date    Asthma    Bronchitis    Depression    Obesity    Schizophrenia (HCC)     Past Surgical History:  Procedure Laterality Date   BREAST BIOPSY Left 09/13/2021   BREAST DUCTAL SYSTEM EXCISION Left 11/17/2022   Procedure: LEFT BREAST CENTRAL DUCT EXCISION;  Surgeon: Sim Dryer, MD;  Location: [facility];  Service: General;  Laterality: Left;   CESAREAN SECTION     CHONDROPLASTY Left 06/02/2022   Procedure: CHONDROPLASTY;  Surgeon: Micheline Ahr, MD;  Location: [facility];  Service: Orthopedics;  Laterality: Left;   COLONOSCOPY  06/27/2019   KNEE ARTHROSCOPY WITH LATERAL RELEASE Left 06/02/2022   Procedure: KNEE ARTHROSCOPY WITH LATERAL RELEASE;  Surgeon: Micheline Ahr, MD;  Location: [facility];  Service: Orthopedics;  Laterality: Left;   KNEE ARTHROSCOPY WITH MEDIAL MENISECTOMY Left 06/02/2022   Procedure: KNEE ARTHROSCOPY WITH MEDIAL MENISECTOMY;  Surgeon: Micheline Ahr, MD;  Location: [facility];  Service: Orthopedics;  Laterality: Left;   UPPER GASTROINTESTINAL ENDOSCOPY  06/27/2019    Family Psychiatric History:  Mother - unsure of diagnosis Sister - unsure of diagnosis Brother - unsure of diagnosis   Family history of suicide attempt: Patient denies Family history of homicide attempt: Patient denies Family history of substance abuse: Patient reports that her mother, father, brother, and sister abused substances  Family History:  Family History  Problem Relation Age of Onset   Crohn's disease Mother    Liver disease Father    Clotting disorder Father    Liver disease Brother    Colon cancer Neg Hx    Esophageal cancer Neg Hx    Rectal cancer Neg Hx    Stomach cancer Neg Hx    Breast cancer Neg Hx     Social History:  Social History   Socioeconomic History   Marital status: Single    Spouse name: Not on file   Number of children: 4   Years of education: Not on file   Highest education level: Not on file   Occupational History   Occupation: stay at home mother  Tobacco Use   Smoking status: Some Days    Current packs/day: 0.50    Types: Cigarettes    Passive exposure: Current   Smokeless tobacco: Never  Vaping Use   Vaping status: Never Used  Substance and Sexual Activity   Alcohol use: Not Currently   Drug use: Not Currently    Types: Marijuana    Comment: occ   Sexual activity: Not Currently    Birth control/protection: None  Other Topics Concern   Not on file  Social History Narrative   Not on file   Social Drivers of Health   Financial Resource Strain: Medium Risk (10/25/2022)   Overall Financial Resource Strain (CARDIA)    Difficulty of Paying Living Expenses: Somewhat hard  Food Insecurity: No Food Insecurity (10/25/2022)   Hunger Vital Sign    Worried About Running Out of Food in the Last Year: Never true    Ran Out of Food in  the Last Year: Never true  Transportation Needs: Unmet Transportation Needs (10/25/2022)   PRAPARE - Transportation    Lack of Transportation (Medical): Yes    Lack of Transportation (Non-Medical): Yes  Physical Activity: Inactive (10/25/2022)   Exercise Vital Sign    Days of Exercise per Week: 0 days    Minutes of Exercise per Session: 0 min  Stress: Stress Concern Present (10/25/2022)   [employer] of Occupational Health - Occupational Stress Questionnaire    Feeling of Stress : Very much  Social Connections: Socially Isolated (10/25/2022)   Social Connection and Isolation Panel [NHANES]    Frequency of Communication with Friends and Family: More than three times a week    Frequency of Social Gatherings with Friends and Family: Never    Attends Religious Services: Never    Database administrator or Organizations: No    Attends Engineer, structural: Never    Marital Status: Never married    Allergies: No Known Allergies  Metabolic Disorder Labs: Lab Results  Component Value Date   HGBA1C 5.4 08/20/2019   No results  found for: "PROLACTIN" No results found for: "CHOL", "TRIG", "HDL", "CHOLHDL", "VLDL", "LDLCALC" No results found for: "TSH"  Therapeutic Level Labs: No results found for: "LITHIUM" No results found for: "VALPROATE" No results found for: "CBMZ"  Current Medications: Current Outpatient Medications  Medication Sig Dispense Refill   albuterol  (VENTOLIN  HFA) 108 (90 Base) MCG/ACT inhaler Inhale 1-2 puffs into the lungs every 6 (six) hours as needed for wheezing or shortness of breath. 8 g 0   ARIPiprazole  (ABILIFY ) 5 MG tablet Take 1 tablet (5 mg total) by mouth daily. 30 tablet 2   clotrimazole -betamethasone  (LOTRISONE ) cream Apply to affected area on back 2 times daily until improved 15 g 0   estradiol cypionate (DEPO-ESTRADIOL) 5 MG/ML injection Inject into the muscle every 28 (twenty-eight) days.     famotidine  (PEPCID ) 20 MG tablet Take 1 tablet (20 mg total) by mouth 2 (two) times daily. 30 tablet 0   fluticasone  (FLONASE ) 50 MCG/ACT nasal spray Place 1 spray into both nostrils daily. 15.8 mL 0   hydrOXYzine  (ATARAX ) 25 MG tablet Take 1 tablet (25 mg total) by mouth at bedtime as needed. 30 tablet 1   mupirocin  ointment (BACTROBAN ) 2 % Apply 1 Application topically 2 (two) times daily. To affected area that is infected till better 22 g 0   omeprazole  (PRILOSEC) 20 MG capsule Take 1 capsule (20 mg total) by mouth daily for 14 days. 14 capsule 0   oxyCODONE  (ROXICODONE ) 5 MG immediate release tablet Take 1 tablet (5 mg total) by mouth every 6 (six) hours as needed for severe pain. 12 tablet 0   promethazine  (PHENERGAN ) 25 MG suppository Place 1 suppository (25 mg total) rectally every 6 (six) hours as needed for nausea or vomiting. 12 each 0   promethazine -dextromethorphan (PROMETHAZINE -DM) 6.25-15 MG/5ML syrup Take 5 mLs by mouth 4 (four) times daily as needed for cough. 118 mL 0   sertraline  (ZOLOFT ) 100 MG tablet Take 1 tablet (100 mg total) by mouth daily. 30 tablet 2   No current  facility-administered medications for this visit.     Musculoskeletal: Strength & Muscle Tone: within normal limits Gait & Station: normal Patient leans: N/A  Psychiatric Specialty Exam: Review of Systems  Psychiatric/Behavioral:  Positive for dysphoric mood and sleep disturbance. Negative for decreased concentration, hallucinations, self-injury and suicidal ideas. The patient is nervous/anxious. The patient is not hyperactive.  Blood pressure 128/78, pulse 70, temperature 98.5 F (36.9 C), temperature source Oral, height 5\' 1"  (1.549 m), weight 256 lb 4.8 oz (116.3 kg), SpO2 100%.Body mass index is 48.43 kg/m.  General Appearance: Casual  Eye Contact:  Good  Speech:  Clear and Coherent and Normal Rate  Volume:  Normal  Mood:  Anxious and Depressed  Affect:  Appropriate  Thought Process:  Coherent, Goal Directed, and Descriptions of Associations: Intact  Orientation:  Full (Time, Place, and Person)  Thought Content: Paranoid Ideation   Suicidal Thoughts:  No  Homicidal Thoughts:  No  Memory:  Immediate;   Good Recent;   Fair Remote;   Fair  Judgement:  Good  Insight:  Present  Psychomotor Activity:  Normal  Concentration:  Concentration: Good and Attention Span: Good  Recall:  Fair  Fund of Knowledge: Good  Language: Good  Akathisia:  No  Handed:  Right  AIMS (if indicated): not done  Assets:  Communication Skills Desire for Improvement Housing Social Support  ADL's:  Intact  Cognition: WNL  Sleep:  Poor   Screenings: AIMS    Flowsheet Row Clinical Support from 08/09/2023 in [facility]  AIMS Total Score 1      GAD-7    Flowsheet Row Clinical Support from 08/09/2023 in [facility] Counselor from 08/08/2023 in [facility] Clinical [facility] Clinical Support from 05/18/2023 in [facility]  Counselor from 04/18/2023 in [facility]  Total GAD-7 Score 10 13 17 17 11       PHQ2-9    Flowsheet Row Clinical Support from 08/09/2023 in [facility] Counselor from 08/08/2023 in [facility] Clinical Support from 06/29/2023 in [facility] Office Visit from 06/07/2023 in [facility] Clinical Support from 05/18/2023 in [facility]  PHQ-2 Total Score 2 1 4 1 4   PHQ-9 Total Score 14 12 19 9 20       Flowsheet Row Clinical Support from 08/09/2023 in [facility] ED from 08/02/2023 in [facility] Clinical Support from 06/29/2023 in [facility]  C-SSRS RISK CATEGORY Moderate Risk No Risk Moderate Risk        Assessment and Plan:   Anna Moses is a 48 year old female with a past psychiatric history significant for for schizoaffective disorder (depressive type), generalized anxiety disorder, and PTSD presents to [facility] for follow-up and medication management.  Patient presents to the encounter stating that she continues to take her medications regularly.  Patient denied experiencing any adverse side effects with her current medication regimen.  Patient further denied experiencing any involuntary movements associated with her use of Abilify .  An aims assessment was performed with the patient scoring a 1.  Patient endorses minimal depression stating that her symptoms have improved since taking her medications regularly.  Despite endorsing minimal depression, patient scored a 14 on her PHQ-9 screen.  She also scored a 10 on her GAD-7 screen.  Patient also continues to endorse paranoia characterized by the belief that someone is watching her.  She reports that her  paranoia mostly occurs at night and when she is outside.  Though patient continues to endorse depression and paranoia, patient endorses stability  and wishes to continue taking her medications as prescribed.  Patient continues to endorse poor sleep stating that she wakes up throughout the night.  Patient is still awaiting a response from the sleep study that she was assessed that.  Due to patient's use of Abilify , provider to obtain the following labs: Hemoglobin A1c, lipid profile, comprehensive metabolic panel, and complete blood count with differential.  Patient vocalized understanding.  Provider informed patient that an up-to-date EKG will need to be obtained.  Patient informed provider that she would get her EKG done at her primary care office.  A [place] Suicide Severity Rating Scale was performed with the patient being considered moderate risk.  Patient denies suicidal ideations and is able to contract for safety following the conclusion of the encounter.  Provider encouraged marijuana cessation.  Collaboration of Care: Collaboration of Care: Medication Management AEB provider managing patient's psychiatric medications, Primary Care Provider AEB patient being seen by a family medicine provider, Psychiatrist AEB patient being followed by a mental health provider at this facility, and Referral or follow-up with counselor/therapist AEB patient being seen by a licensed clinical social worker at this facility  Patient/Guardian was advised Release of Information must be obtained prior to any record release in order to collaborate their care with an outside provider. Patient/Guardian was advised if they have not already done so to contact the registration department to sign all necessary forms in order for us  to release information regarding their care.   Consent: Patient/Guardian gives verbal consent for treatment and assignment of benefits for services provided during this visit. Patient/Guardian  expressed understanding and agreed to proceed.   1. Schizoaffective disorder, depressive type (HCC)  - sertraline  (ZOLOFT ) 100 MG tablet; Take 1 tablet (100 mg total) by mouth daily.  Dispense: 30 tablet; Refill: 2 - ARIPiprazole  (ABILIFY ) 5 MG tablet; Take 1 tablet (5 mg total) by mouth daily.  Dispense: 30 tablet; Refill: 2  2. Generalized anxiety disorder  - sertraline  (ZOLOFT ) 100 MG tablet; Take 1 tablet (100 mg total) by mouth daily.  Dispense: 30 tablet; Refill: 2  3. PTSD (post-traumatic stress disorder)  - sertraline  (ZOLOFT ) 100 MG tablet; Take 1 tablet (100 mg total) by mouth daily.  Dispense: 30 tablet; Refill: 2  4. Marijuana use (Primary)  5. Insomnia due to other mental disorder Awaiting response from sleep study facility  6. Long term current use of antipsychotic medication EKG to be obtained from primary care office  - Lipid panel; Future - HgB A1c; Future - Comprehensive metabolic panel with GFR; Future - CBC with Differential/Platelet; Future  Patient to follow up in 2 months Provider spent a total of 23 minutes with the patient/reviewing patient's chart  Gates Kasal, PA 08/09/2023, 9:11 PM

## 2023-08-25 DIAGNOSIS — Z8249 Family history of ischemic heart disease and other diseases of the circulatory system: Secondary | ICD-10-CM | POA: Diagnosis not present

## 2023-08-25 DIAGNOSIS — Z809 Family history of malignant neoplasm, unspecified: Secondary | ICD-10-CM | POA: Diagnosis not present

## 2023-08-25 DIAGNOSIS — R030 Elevated blood-pressure reading, without diagnosis of hypertension: Secondary | ICD-10-CM | POA: Diagnosis not present

## 2023-08-25 DIAGNOSIS — F411 Generalized anxiety disorder: Secondary | ICD-10-CM | POA: Diagnosis not present

## 2023-08-25 DIAGNOSIS — I739 Peripheral vascular disease, unspecified: Secondary | ICD-10-CM | POA: Diagnosis not present

## 2023-08-25 DIAGNOSIS — I499 Cardiac arrhythmia, unspecified: Secondary | ICD-10-CM | POA: Diagnosis not present

## 2023-08-25 DIAGNOSIS — F329 Major depressive disorder, single episode, unspecified: Secondary | ICD-10-CM | POA: Diagnosis not present

## 2023-08-25 DIAGNOSIS — Z823 Family history of stroke: Secondary | ICD-10-CM | POA: Diagnosis not present

## 2023-08-25 DIAGNOSIS — Z833 Family history of diabetes mellitus: Secondary | ICD-10-CM | POA: Diagnosis not present

## 2023-08-25 DIAGNOSIS — Z720 Tobacco use: Secondary | ICD-10-CM | POA: Diagnosis not present

## 2023-08-25 DIAGNOSIS — R32 Unspecified urinary incontinence: Secondary | ICD-10-CM | POA: Diagnosis not present

## 2023-09-05 ENCOUNTER — Telehealth: Payer: Self-pay

## 2023-09-05 NOTE — Telephone Encounter (Signed)
 Received the following message from admin team for scheduling request.   My right side is going numb.   Called patient to gather further information.   She reports that for the last two months, she has been experiencing right sided numbness when she lies down. States that numbness starts in right arm and radiates into R hand, R leg and R foot.    She also reports limited range of motion in left arm. States that she is unable to raise arm all the way above her head.    She denies facial drooping, slurred speech, visual changes, gait abnormalities.   Given that symptoms have been present for two months, scheduled patient for evaluation tomorrow morning.   ED precautions discussed.   Elsie Halo, RN

## 2023-09-06 ENCOUNTER — Ambulatory Visit (INDEPENDENT_AMBULATORY_CARE_PROVIDER_SITE_OTHER): Admitting: Student

## 2023-09-06 ENCOUNTER — Ambulatory Visit

## 2023-09-06 ENCOUNTER — Encounter: Payer: Self-pay | Admitting: Student

## 2023-09-06 VITALS — BP 130/74 | HR 72 | Temp 98.8°F | Ht 61.0 in | Wt 258.6 lb

## 2023-09-06 DIAGNOSIS — R200 Anesthesia of skin: Secondary | ICD-10-CM | POA: Diagnosis not present

## 2023-09-06 DIAGNOSIS — R351 Nocturia: Secondary | ICD-10-CM | POA: Diagnosis not present

## 2023-09-06 DIAGNOSIS — Z3042 Encounter for surveillance of injectable contraceptive: Secondary | ICD-10-CM | POA: Diagnosis not present

## 2023-09-06 LAB — POCT URINALYSIS DIP (MANUAL ENTRY)
Bilirubin, UA: NEGATIVE
Glucose, UA: NEGATIVE mg/dL
Ketones, POC UA: NEGATIVE mg/dL
Leukocytes, UA: NEGATIVE
Nitrite, UA: NEGATIVE
Protein Ur, POC: NEGATIVE mg/dL
Spec Grav, UA: 1.025 (ref 1.010–1.025)
Urobilinogen, UA: 0.2 U/dL
pH, UA: 5.5 (ref 5.0–8.0)

## 2023-09-06 LAB — POCT UA - MICROSCOPIC ONLY: WBC, Ur, HPF, POC: NONE SEEN (ref 0–5)

## 2023-09-06 MED ORDER — MEDROXYPROGESTERONE ACETATE 150 MG/ML IM SUSP
150.0000 mg | Freq: Once | INTRAMUSCULAR | Status: AC
  Administered 2023-09-06: 150 mg via INTRAMUSCULAR

## 2023-09-06 NOTE — Progress Notes (Signed)
  SUBJECTIVE:   CHIEF COMPLAINT / HPI:   Anna Moses is a 48 year old female who presents with right-sided numbness and frequent urination.  She experiences intermittent numbness on the right side, affecting the shoulder and lower extremities, for two months. The numbness occurs mostly at night and while sitting, accompanied by tingling and occasional shooting pain down her back. She has had two falls due to her right leg giving out and needs to wait before getting up after a fall. No facial numbness is present. Back pain occurs with certain movements.  She has frequent urination for five to six months, with urgency and risk of incontinence if delayed. She urinates approximately every hour at night and feels relief after urination. No bowel movement issues are reported.  She smokes half a pack of cigarettes per day since age 60. She is on disability and does not work. Physical activity is limited to walking to her daughter's house, with activity reduced when pain occurs.   PERTINENT  PMH / PSH: GAD/depression, schizophrenia  OBJECTIVE:  BP 130/74   Pulse 72   Temp 98.8 F (37.1 C)   Ht 5\' 1"  (1.549 m)   Wt 258 lb 9.6 oz (117.3 kg)   SpO2 97%   BMI 48.86 kg/m  Gen: well-appearing, NAD Neuro: CN II-XII intact, normal speech, diminished sensation in distribution of C7-C8 on R and L3-L5 on R, strength limited by pain but 5/5 and equal bilaterally in all extremities MSK: no spinal tenderness; SI joint, piriformis and gluteus maximus tenderness to palpation on the R, no appreciable trochanteric bursa tenderness  ASSESSMENT/PLAN:   Assessment & Plan Right sided numbness Pattern of numbness is inconsistent with common diagnoses, likely will need further workup. Does not have diabetes and medications would not explain distribution either. Stroke is unlikely given chronicity and functional ability. Functioning well otherwise and without further organ system involvement. Cauda  equina unlikely given bowel and bladder history.  - Explore further with C-spine and L-spine XR.  - Referral to sports medicine to explore potential tunnelopathy in right upper extremity - Referral to Neurology for consideration of EMG Frequent urination at night Chronic, 5-6 months. Also with history of microscopic hematuria. UA with trace-intact blood although RBC 0-2 which is more reassuring.  - Referral to urology given age and smoking history Return if symptoms worsen or fail to improve. Veronia Goon, DO 09/06/2023, 9:37 AM PGY-3, [facility]

## 2023-09-06 NOTE — Progress Notes (Signed)
 Patient here today for Depo Provera  injection and is within her dates.    Last contraceptive appt was 06/07/2023  Depo given in left deltoid, per patient request.  Site unremarkable & patient tolerated injection.    Next injection due 11/22/23-12/06/23.  Reminder card given.    Elsie Halo, RN

## 2023-09-06 NOTE — Patient Instructions (Addendum)
 It was great to see you today! Thank you for choosing [facility] for your primary care.  Today we addressed: Referral to urology for urinary urgency and hematuria. Xrays and referral to sports med and neurology for right sided numbness.  I have placed an order for xrays.  Please go to [facility] [employer] to have this completed.  You do not need an appointment, but if you would like to call them beforehand, their number is [phone].  We will contact you with your results afterwards.   If you haven't already, sign up for My Chart to have easy access to your labs results, and communication with your primary care physician.  Return if symptoms worsen or fail to improve. Please arrive 15 minutes before your appointment to ensure smooth check in process.  We appreciate your efforts in making this happen.  Thank you for allowing me to participate in your care, Veronia Goon, DO 09/06/2023, 9:36 AM PGY-3, [facility]

## 2023-09-12 ENCOUNTER — Encounter: Payer: Self-pay | Admitting: Family Medicine

## 2023-09-12 ENCOUNTER — Ambulatory Visit (INDEPENDENT_AMBULATORY_CARE_PROVIDER_SITE_OTHER): Admitting: Family Medicine

## 2023-09-12 VITALS — BP 121/80 | Ht 61.0 in | Wt 250.0 lb

## 2023-09-12 DIAGNOSIS — R202 Paresthesia of skin: Secondary | ICD-10-CM

## 2023-09-12 DIAGNOSIS — R200 Anesthesia of skin: Secondary | ICD-10-CM
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
## 2023-09-13 ENCOUNTER — Ambulatory Visit
Admission: RE | Admit: 2023-09-13 | Discharge: 2023-09-13 | Disposition: A | Discharge status: Home / Self Care | Source: Ambulatory Visit | Attending: Family Medicine | Admitting: Family Medicine

## 2023-09-13 DIAGNOSIS — M549 Dorsalgia, unspecified: Secondary | ICD-10-CM | POA: Diagnosis not present

## 2023-09-13 DIAGNOSIS — R200 Anesthesia of skin: Secondary | ICD-10-CM

## 2023-09-19 ENCOUNTER — Encounter: Payer: Self-pay | Admitting: Family Medicine

## 2023-09-19 ENCOUNTER — Encounter: Payer: Self-pay | Admitting: *Deleted

## 2023-09-19 ENCOUNTER — Encounter: Payer: Self-pay | Admitting: Student

## 2023-09-20 ENCOUNTER — Ambulatory Visit
Admission: RE | Admit: 2023-09-20 | Discharge: 2023-09-20 | Disposition: A | Discharge status: Home / Self Care | Source: Ambulatory Visit | Attending: Family Medicine | Admitting: Family Medicine

## 2023-09-20 ENCOUNTER — Encounter: Payer: Self-pay | Admitting: Student

## 2023-09-20 ENCOUNTER — Ambulatory Visit: Payer: Self-pay | Admitting: Family Medicine

## 2023-09-20 ENCOUNTER — Encounter: Payer: Self-pay | Admitting: Family Medicine

## 2023-09-20 ENCOUNTER — Ambulatory Visit (INDEPENDENT_AMBULATORY_CARE_PROVIDER_SITE_OTHER): Admitting: Student

## 2023-09-20 VITALS — BP 144/100 | HR 81 | Ht 61.0 in | Wt 258.0 lb

## 2023-09-20 DIAGNOSIS — R5382 Chronic fatigue, unspecified: Secondary | ICD-10-CM | POA: Diagnosis not present

## 2023-09-20 DIAGNOSIS — I10 Essential (primary) hypertension: Secondary | ICD-10-CM

## 2023-09-20 DIAGNOSIS — R200 Anesthesia of skin: Secondary | ICD-10-CM | POA: Diagnosis not present

## 2023-09-20 DIAGNOSIS — Z0000 Encounter for general adult medical examination without abnormal findings: Secondary | ICD-10-CM | POA: Diagnosis not present

## 2023-09-20 MED ORDER — AMLODIPINE BESYLATE 5 MG PO TABS: 5.0000 mg | ORAL_TABLET | Freq: Every day | ORAL | 3 refills | Status: AC

## 2023-09-20 NOTE — Patient Instructions (Addendum)
 Pleasure to meet you today.  I recommend an unusual sleep hygiene.  When you lay down in bed at 9/930  please turn off all TVs and phone and start winding down to go to sleep.  Also recommend increasing your activity level.  You can do 20-30 minutes walk at least 3 times a week and increase your weight as tolerated.  Increasing your activity level and exercise level will also help with weight loss which can help with some of the back pain that you are currently experiencing.  Also it will be very beneficial if you quit smoking.  Follow-up with us  whenever you are ready to quit smoking and we can look at treatment options.  Your blood pressure today was elevated and I have started you on amlodipine 5 mg which you will take once daily.  Follow-up in 2 weeks with your PCP to assess your blood pressure.  Today we ordered labs to check your A1c, electrolytes, kidney function, thyroid and cholesterol levels.  Please go to the [facility] across the street to have your lab collected. [id number], [place].

## 2023-09-20 NOTE — Progress Notes (Signed)
    SUBJECTIVE:   CHIEF COMPLAINT / HPI:   48 year old female history of depression, schizophrenia and PTSD presents today for annual well visits.  She reports doing okay overall other than her life stressors impacting her depression.  She sees her counselor frequently on current Abilify  for depression.  Recently saw her therapist who recommended she have blood work done.  Denies any active HI/SI and no some days feel better dead than alive she believes she have something to live for her and her kids and grandkids. And so has no intention for self-harm.  Endorses poor sleep, usually goes to bed around midnight and wakes up around 4 AM.  Gets in bed around 930 but on her phone and TV until she is ready to fall asleep around midnight.  Describes appetite as poor,eats once or twice a day.  Slee sedimentary lifestyle and no exercise.  Smokes Cigarettes and uses marijuana daily.  Has any use of heroin or cocaine, No alcohol use.  She has 4 grandkids and 4 kids.  PERTINENT  PMH / PSH: Reviewed   OBJECTIVE:   BP (!) 144/93   Pulse 81   Ht 5' 1 (1.549 m)   Wt 258 lb (117 kg)   SpO2 96%   BMI 48.75 kg/m        09/20/2023    8:54 AM 09/06/2023    8:55 AM 08/09/2023   10:09 AM 08/08/2023    4:31 PM 06/29/2023   11:18 AM  Depression screen PHQ 2/9  Decreased Interest 1 1     Down, Depressed, Hopeless 1 1     PHQ - 2 Score 2 2     Altered sleeping 3 2     Tired, decreased energy 1 0     Change in appetite 3 3     Feeling bad or failure about yourself  1 1     Trouble concentrating 1 0     Moving slowly or fidgety/restless 0 0     Suicidal thoughts 1 0     PHQ-9 Score 12 8     Difficult doing work/chores          Information is confidential and restricted. Go to Review Flowsheets to unlock data.     Physical Exam General: Alert, morbidly obese, well appearing, NAD Cardiovascular: RRR, No Murmurs, Normal S2/S2 Respiratory: CTAB, No wheezing or Rales Abdomen: No distension or  tenderness Extremities: No edema on extremities   Skin: Warm and dry  ASSESSMENT/PLAN:   Annual Physical Reviewed patient's Family Medical History Reviewed and updated list of patient's medical providers Counseled patient on tobacco, alcohol use Recommend moderate exercise at least 150 hours a week Emphasized need for healthy dieting Advised smoking cessation  Assessment of cognitive impairment was done Assessed patient's functional ability Health Risk Assessent Completed and Reviewed. Completed his work physical form.  Considered the following screening exams based upon USPSTF recommendations: Diabetes screening: A1c ordered Screening for elevated cholesterol: Lipid panel ordered  Reviewed risk factors for latent tuberculosis and not indicated Colorectal cancer screening:    Goble Last, MD [facility]

## 2023-09-21 LAB — BASIC METABOLIC PANEL WITH GFR
BUN/Creatinine Ratio: 13 (ref 9–23)
BUN: 12 mg/dL (ref 6–24)
CO2: 15 mmol/L — ABNORMAL LOW (ref 20–29)
Calcium: 9.4 mg/dL (ref 8.7–10.2)
Chloride: 106 mmol/L (ref 96–106)
Creatinine, Ser: 0.93 mg/dL (ref 0.57–1.00)
Glucose: 78 mg/dL (ref 70–99)
Potassium: 4.4 mmol/L (ref 3.5–5.2)
Sodium: 137 mmol/L (ref 134–144)
eGFR: 77 mL/min/{1.73_m2} (ref >59)

## 2023-09-21 LAB — CBC
Hematocrit: 40.9 % (ref 34.0–46.6)
Hemoglobin: 13.4 g/dL (ref 11.1–15.9)
MCH: 29.1 pg (ref 26.6–33.0)
MCHC: 32.8 g/dL (ref 31.5–35.7)
MCV: 89 fL (ref 79–97)
Platelets: 441 10*3/uL (ref 150–450)
RBC: 4.6 x10E6/uL (ref 3.77–5.28)
RDW: 12.5 % (ref 11.7–15.4)
WBC: 11.1 10*3/uL — ABNORMAL HIGH (ref 3.4–10.8)

## 2023-09-21 LAB — LIPID PANEL
Chol/HDL Ratio: 4.7 ratio — ABNORMAL HIGH (ref 0.0–4.4)
Cholesterol, Total: 189 mg/dL (ref 100–199)
HDL: 40 mg/dL (ref >39)
LDL Chol Calc (NIH): 133 mg/dL — ABNORMAL HIGH (ref 0–99)
Triglycerides: 87 mg/dL (ref 0–149)
VLDL Cholesterol Cal: 16 mg/dL (ref 5–40)

## 2023-09-21 LAB — TSH: TSH: 1.07 u[IU]/mL (ref 0.450–4.500)

## 2023-09-25 ENCOUNTER — Ambulatory Visit: Payer: Self-pay | Admitting: Student

## 2023-09-26 ENCOUNTER — Emergency Department (HOSPITAL_COMMUNITY)

## 2023-09-26 ENCOUNTER — Ambulatory Visit: Admission: EM | Admit: 2023-09-26 | Discharge: 2023-09-26 | Disposition: A | Discharge status: Home / Self Care

## 2023-09-26 ENCOUNTER — Other Ambulatory Visit: Payer: Self-pay

## 2023-09-26 ENCOUNTER — Encounter (HOSPITAL_COMMUNITY): Payer: Self-pay

## 2023-09-26 ENCOUNTER — Emergency Department (HOSPITAL_COMMUNITY)
Admission: EM | Admit: 2023-09-26 | Discharge: 2023-09-26 | Disposition: A | Discharge status: Home / Self Care | Attending: Emergency Medicine | Admitting: Emergency Medicine

## 2023-09-26 DIAGNOSIS — R197 Diarrhea, unspecified: Secondary | ICD-10-CM | POA: Insufficient documentation

## 2023-09-26 DIAGNOSIS — K760 Fatty (change of) liver, not elsewhere classified: Secondary | ICD-10-CM | POA: Diagnosis not present

## 2023-09-26 DIAGNOSIS — R0689 Other abnormalities of breathing: Secondary | ICD-10-CM | POA: Diagnosis not present

## 2023-09-26 DIAGNOSIS — R109 Unspecified abdominal pain: Secondary | ICD-10-CM | POA: Diagnosis not present

## 2023-09-26 DIAGNOSIS — R112 Nausea with vomiting, unspecified: Secondary | ICD-10-CM | POA: Diagnosis not present

## 2023-09-26 DIAGNOSIS — R001 Bradycardia, unspecified: Secondary | ICD-10-CM | POA: Diagnosis not present

## 2023-09-26 DIAGNOSIS — R1013 Epigastric pain: Secondary | ICD-10-CM | POA: Insufficient documentation

## 2023-09-26 LAB — CBC WITH DIFFERENTIAL/PLATELET
Abs Immature Granulocytes: 0.05 10*3/uL (ref 0.00–0.07)
Basophils Absolute: 0.1 10*3/uL (ref 0.0–0.1)
Basophils Relative: 1 %
Eosinophils Absolute: 0 10*3/uL (ref 0.0–0.5)
Eosinophils Relative: 0 %
HCT: 41.9 % (ref 36.0–46.0)
Hemoglobin: 13.7 g/dL (ref 12.0–15.0)
Immature Granulocytes: 1 %
Lymphocytes Relative: 33 %
Lymphs Abs: 3.4 10*3/uL (ref 0.7–4.0)
MCH: 29.3 pg (ref 26.0–34.0)
MCHC: 32.7 g/dL (ref 30.0–36.0)
MCV: 89.5 fL (ref 80.0–100.0)
Monocytes Absolute: 0.5 10*3/uL (ref 0.1–1.0)
Monocytes Relative: 5 %
Neutro Abs: 6.3 10*3/uL (ref 1.7–7.7)
Neutrophils Relative %: 60 %
Platelets: 437 10*3/uL — ABNORMAL HIGH (ref 150–400)
RBC: 4.68 MIL/uL (ref 3.87–5.11)
RDW: 13.4 % (ref 11.5–15.5)
WBC: 10.3 10*3/uL (ref 4.0–10.5)
nRBC: 0 % (ref 0.0–0.2)

## 2023-09-26 LAB — COMPREHENSIVE METABOLIC PANEL WITH GFR
ALT: 17 U/L (ref 0–44)
AST: 18 U/L (ref 15–41)
Albumin: 3.7 g/dL (ref 3.5–5.0)
Alkaline Phosphatase: 54 U/L (ref 38–126)
Anion gap: 13 (ref 5–15)
BUN: 9 mg/dL (ref 6–20)
CO2: 20 mmol/L — ABNORMAL LOW (ref 22–32)
Calcium: 9.1 mg/dL (ref 8.9–10.3)
Chloride: 108 mmol/L (ref 98–111)
Creatinine, Ser: 0.86 mg/dL (ref 0.44–1.00)
GFR, Estimated: >60 mL/min (ref >60)
Glucose, Bld: 154 mg/dL — ABNORMAL HIGH (ref 70–99)
Potassium: 3.5 mmol/L (ref 3.5–5.1)
Sodium: 141 mmol/L (ref 135–145)
Total Bilirubin: 0.6 mg/dL (ref 0.0–1.2)
Total Protein: 7.4 g/dL (ref 6.5–8.1)

## 2023-09-26 LAB — URINALYSIS, ROUTINE W REFLEX MICROSCOPIC
Bilirubin Urine: NEGATIVE
Glucose, UA: 50 mg/dL — AB
Hgb urine dipstick: NEGATIVE
Ketones, ur: 20 mg/dL — AB
Leukocytes,Ua: NEGATIVE
Nitrite: NEGATIVE
Protein, ur: NEGATIVE mg/dL
Specific Gravity, Urine: >1.046 — ABNORMAL HIGH (ref 1.005–1.030)
pH: 6 (ref 5.0–8.0)

## 2023-09-26 LAB — LIPASE, BLOOD: Lipase: 37 U/L (ref 11–51)

## 2023-09-26 LAB — CBG MONITORING, ED: Glucose-Capillary: 152 mg/dL — ABNORMAL HIGH (ref 70–99)

## 2023-09-26 LAB — HCG, SERUM, QUALITATIVE: Preg, Serum: NEGATIVE

## 2023-09-26 MED ORDER — PANTOPRAZOLE SODIUM 40 MG IV SOLR
40.0000 mg | Freq: Once | INTRAVENOUS | Status: AC
  Administered 2023-09-26: 40 mg via INTRAVENOUS
  Filled 2023-09-26: qty 10

## 2023-09-26 MED ORDER — IOHEXOL 300 MG/ML  SOLN
100.0000 mL | Freq: Once | INTRAMUSCULAR | Status: AC | PRN
  Administered 2023-09-26: 100 mL via INTRAVENOUS

## 2023-09-26 MED ORDER — ONDANSETRON 4 MG PO TBDP: 4.0000 mg | ORAL_TABLET | Freq: Three times a day (TID) | ORAL | 0 refills | Status: AC | PRN

## 2023-09-26 MED ORDER — PANTOPRAZOLE SODIUM 20 MG PO TBEC
20.0000 mg | DELAYED_RELEASE_TABLET | Freq: Every day | ORAL | 0 refills | Status: AC
Start: 2023-09-26 — End: ?

## 2023-09-26 MED ORDER — MORPHINE SULFATE (PF) 4 MG/ML IV SOLN
4.0000 mg | Freq: Once | INTRAVENOUS | Status: AC
  Administered 2023-09-26: 4 mg via INTRAVENOUS
  Filled 2023-09-26: qty 1

## 2023-09-26 MED ORDER — ONDANSETRON HCL 4 MG/2ML IJ SOLN
4.0000 mg | Freq: Once | INTRAMUSCULAR | Status: AC
  Administered 2023-09-26: 4 mg via INTRAVENOUS
  Filled 2023-09-26: qty 2

## 2023-09-26 MED ORDER — SODIUM CHLORIDE 0.9 % IV BOLUS
1000.0000 mL | Freq: Once | INTRAVENOUS | Status: AC
  Administered 2023-09-26: 1000 mL via INTRAVENOUS

## 2023-09-26 NOTE — ED Notes (Signed)
 Patient given popsicle able to tolerated, denies N/V

## 2023-09-26 NOTE — ED Triage Notes (Signed)
 Pt presents to ED from home via EMS for N/V/D x 2 days. Denies sick contacts.  97.4 114/72  97% CBG 147

## 2023-09-26 NOTE — ED Triage Notes (Signed)
 Patient presents to office for stomach pain and vomiting. Patient was seen in the ED earlier today.

## 2023-09-26 NOTE — ED Provider Notes (Signed)
 [place] EMERGENCY DEPARTMENT AT [facility] Provider Note   CSN: [id number] Arrival date & time: 09/26/23  0630     Patient presents with: Emesis, Nausea, and Diarrhea   Anna Moses is a 48 y.o. female.   48 year old female BIB EMS presenting with nausea/vomiting/diarrhea.  Symptoms have been ongoing for 2 days and began suddenly, no known sick contacts.  No reported fevers at home but she does report sweats, reports that vomiting has been dark red at times, no dysuria but does report hematuria at times, no chest pain/shortness of breath.  Denies past abdominal surgeries other than C-section.  Rates her pain as an 8 out of 10 currently. She admits to daily tobacco use but none since the onset of her illness, occasional THC edible use but none in > 1 week, denies alcohol use, denies use of NSAIDs. History of gastritis, not currently following by GI.   Emesis Associated symptoms: diarrhea   Diarrhea Associated symptoms: vomiting        Prior to Admission medications   Medication Sig Start Date End Date Taking? Authorizing Provider  albuterol  (VENTOLIN  HFA) 108 (90 Base) MCG/ACT inhaler Inhale 1-2 puffs into the lungs every 6 (six) hours as needed for wheezing or shortness of breath. 05/18/22   Starlene Eaton, FNP  amLODipine  (NORVASC ) 5 MG tablet Take 1 tablet (5 mg total) by mouth at bedtime. 09/20/23   Goble Last, MD  ARIPiprazole  (ABILIFY ) 5 MG tablet Take 1 tablet (5 mg total) by mouth daily. 08/09/23 08/08/24  Nwoko, Uchenna E, PA  clotrimazole -betamethasone  (LOTRISONE ) cream Apply to affected area on back 2 times daily until improved 12/01/22   Banister, Pamela K, MD  estradiol cypionate (DEPO-ESTRADIOL) 5 MG/ML injection Inject into the muscle every 28 (twenty-eight) days.    [provider]  famotidine  (PEPCID ) 20 MG tablet Take 1 tablet (20 mg total) by mouth 2 (two) times daily. 06/17/22   Zackowski, Cullan Launer, MD  fluticasone  (FLONASE ) 50  MCG/ACT nasal spray Place 1 spray into both nostrils daily. 08/02/23   Mayer, Jodi R, NP  hydrOXYzine  (ATARAX ) 25 MG tablet Take 1 tablet (25 mg total) by mouth at bedtime as needed. 05/18/23   Nwoko, Uchenna E, PA  mupirocin  ointment (BACTROBAN ) 2 % Apply 1 Application topically 2 (two) times daily. To affected area that is infected till better 12/01/22   Ann Keto, MD  omeprazole  (PRILOSEC) 20 MG capsule Take 1 capsule (20 mg total) by mouth daily for 14 days. 06/17/22 07/01/22  Rising, Ivette Marks, PA-C  oxyCODONE  (ROXICODONE ) 5 MG immediate release tablet Take 1 tablet (5 mg total) by mouth every 6 (six) hours as needed for severe pain. 12/01/22   Ann Keto, MD  promethazine  (PHENERGAN ) 25 MG suppository Place 1 suppository (25 mg total) rectally every 6 (six) hours as needed for nausea or vomiting. 06/21/22   Schutt, Coni Deep, PA-C  promethazine -dextromethorphan (PROMETHAZINE -DM) 6.25-15 MG/5ML syrup Take 5 mLs by mouth 4 (four) times daily as needed for cough. 08/02/23   Mayer, Jodi R, NP  sertraline  (ZOLOFT ) 100 MG tablet Take 1 tablet (100 mg total) by mouth daily. 08/09/23 08/08/24  Nwoko, Uchenna E, PA  diphenhydrAMINE  (BENADRYL ) 25 MG tablet Take 1 tablet (25 mg total) by mouth every 6 (six) hours. Patient not taking: Reported on 12/12/2018 10/21/17 05/30/19  Wynetta Heckle, MD  Ibuprofen -diphenhydrAMINE  Cit (ADVIL  PM) 200-38 MG TABS Take 2 tablets by mouth 2 (two) times daily as needed (headaches).  05/30/19  [provider]    Allergies: Patient has no known allergies.    Review of Systems  Gastrointestinal:  Positive for diarrhea and vomiting.    Updated Vital Signs  Vitals:   09/26/23 0640 09/26/23 0644 09/26/23 0753  BP:  (!) 165/97 (!) 155/79  Pulse:  (!) 58 66  Resp:  (!) 29 16  Temp:  98.3 F (36.8 C) 97.9 F (36.6 C)  TempSrc:  Oral Oral  SpO2:  100% 96%  Height: 5' 1 (1.549 m)       Physical Exam Vitals and nursing note reviewed.  Constitutional:       General: She is in acute distress.  HENT:     Head: Normocephalic.   Eyes:     Extraocular Movements: Extraocular movements intact.    Cardiovascular:     Rate and Rhythm: Normal rate and regular rhythm.  Pulmonary:     Breath sounds: Normal breath sounds.  Abdominal:     Palpations: Abdomen is soft.     Tenderness: There is abdominal tenderness (epigastric). There is guarding.   Musculoskeletal:     Cervical back: Normal range of motion.     Right lower leg: No edema.     Left lower leg: No edema.     Comments: Moves all extremities spontaneously without difficulty   Skin:    General: Skin is warm and dry.   Neurological:     Mental Status: She is alert and oriented to person, place, and time.    (all labs ordered are listed, but only abnormal results are displayed) Labs Reviewed  COMPREHENSIVE METABOLIC PANEL WITH GFR - Abnormal; Notable for the following components:      Result Value   CO2 20 (*)    Glucose, Bld 154 (*)    All other components within normal limits  CBC WITH DIFFERENTIAL/PLATELET - Abnormal; Notable for the following components:   Platelets 437 (*)    All other components within normal limits  CBG MONITORING, ED - Abnormal; Notable for the following components:   Glucose-Capillary 152 (*)    All other components within normal limits  LIPASE, BLOOD  HCG, SERUM, QUALITATIVE  URINALYSIS, ROUTINE W REFLEX MICROSCOPIC    EKG: None  Radiology: No results found.   Procedures   Medications Ordered in the ED  pantoprazole  (PROTONIX ) injection 40 mg (has no administration in time range)  ondansetron  (ZOFRAN ) injection 4 mg (4 mg Intravenous Given 09/26/23 0657)  morphine  (PF) 4 MG/ML injection 4 mg (4 mg Intravenous Given 09/26/23 0657)  sodium chloride  0.9 % bolus 1,000 mL (1,000 mLs Intravenous New Bag/Given 09/26/23 0657)  iohexol  (OMNIPAQUE ) 300 MG/ML solution 100 mL (100 mLs Intravenous Contrast Given 09/26/23 0756)                                     Medical Decision Making This patient presents to the ED for concern of N/V/D/abdominal pain, this involves an extensive number of treatment options, and is a complaint that carries with it a high risk of complications and morbidity.  The differential diagnosis includes PUD, pancreatitis, gastritis, cholecystitis, appendicitis, diverticulitis.    Co morbidities that complicate the patient evaluation  Schizophrenia, h/o gastritis, h/o cannabinoid cyclical vomiting, h/o H. Pylori in 2021   Additional history obtained:  Additional history obtained from record review External records from outside source obtained and reviewed including prior PCP / gastroenterology notes  Lab Tests:  I Ordered, and personally interpreted labs.  The pertinent results include: CBC and CMP unremarkable, no leukocytosis, electrolytes within normal limits.  Lipase within normal limits.  Serum hCG negative.  Urinalysis notable for high specific gravity with some glucose and some ketones, this is likely secondary to dehydration given recurrent vomiting.   Imaging Studies ordered:  I ordered imaging studies including CT abdomen/pelvis with contrast  I independently visualized and interpreted imaging which showed 1. No acute intra-abdominal or pelvic abnormality. 2. Hepatic steatosis.  I agree with the radiologist interpretation   Cardiac Monitoring: / EKG:  The patient was maintained on a cardiac monitor.  I personally viewed and interpreted the cardiac monitored which showed an underlying rhythm of: sinus bradycardia   Problem List / ED Course / Critical interventions / Medication management  IV fluids given for rehydration I ordered medication including zofran   for nausea, morphine  for pain, pantoprazole  for suspected gastritis  Reevaluation of the patient after these medicines showed that the patient improved I have reviewed the patients home medicines and have made adjustments as  needed   Social Determinants of Health:  Tobacco use, depression, financial instability   Test / Admission - Considered:  Physical exam notable as above.  Patient reports occasional dark vomiting that sounds consistent with upper GI bleed type symptoms, however at time of my assessment there was vomit in visualized in the emesis bag that was clear without evidence of coffee ground emesis. Will obtain CT scan given significant epigastric tenderness noted on physical exam.  At time of my reassessment, patient's pain has improved significantly, she has had no additional episodes of vomiting since first episode upon arrival.  Labs and imaging are largely reassuring as above. I suspect that patient's symptoms are likely secondary to gastritis given that she has history of this as well as history of H. pylori, she has been seen by [facility] gastroenterology in the past but has not continue to follow-up with them.  At this time I feel the patient should follow-up in the outpatient setting with [facility] gastroenterology given the continued nature of her symptoms.  I will prescribe pantoprazole  to be taken once daily, this is a medication that patient has been prescribed in the past but did not follow-up with.  I will prescribe Zofran  to be used as needed for nausea.  Patient voiced understanding and is in agreement with this plan.  Return precautions discussed.  She is appropriate for discharge at this time.    Amount and/or Complexity of Data Reviewed Labs: ordered. Radiology: ordered.  Risk Prescription drug management.        Final diagnoses:  Epigastric pain  Nausea vomiting and diarrhea    ED Discharge Orders          Ordered    pantoprazole  (PROTONIX ) 20 MG tablet  Daily        09/26/23 0926    ondansetron  (ZOFRAN -ODT) 4 MG disintegrating tablet  Every 8 hours PRN        09/26/23 1018               Kendrick Pax, PA-C 09/26/23 1026    Ninetta Basket,  MD 09/26/23 1726

## 2023-09-26 NOTE — Discharge Instructions (Addendum)
 Contact your primary care provider to schedule follow-up.  Call the number provided above for [place] [facility] to schedule an appointment for follow-up in regard to your abdominal pain.  Return to the emergency department if your symptoms worsen.  Start pantoprazole , 1 tablet by mouth daily. Start Zofran , use every 8hrs as needed for nausea. Avoid tobacco use, alcohol use, NSAID use, as this may be contributing to worsening abdominal pain.

## 2023-09-26 NOTE — ED Notes (Signed)
 Patient transported to CT

## 2023-09-27 ENCOUNTER — Emergency Department (HOSPITAL_COMMUNITY)
Admission: EM | Admit: 2023-09-27 | Discharge: 2023-09-27 | Disposition: A | Discharge status: Home / Self Care | Attending: Emergency Medicine | Admitting: Emergency Medicine

## 2023-09-27 ENCOUNTER — Other Ambulatory Visit: Payer: Self-pay

## 2023-09-27 ENCOUNTER — Emergency Department (HOSPITAL_COMMUNITY)

## 2023-09-27 DIAGNOSIS — R001 Bradycardia, unspecified: Secondary | ICD-10-CM | POA: Diagnosis not present

## 2023-09-27 DIAGNOSIS — R1013 Epigastric pain: Secondary | ICD-10-CM | POA: Diagnosis not present

## 2023-09-27 DIAGNOSIS — R197 Diarrhea, unspecified: Secondary | ICD-10-CM | POA: Diagnosis not present

## 2023-09-27 DIAGNOSIS — R112 Nausea with vomiting, unspecified: Secondary | ICD-10-CM | POA: Diagnosis not present

## 2023-09-27 DIAGNOSIS — R079 Chest pain, unspecified: Secondary | ICD-10-CM | POA: Diagnosis not present

## 2023-09-27 LAB — URINALYSIS, ROUTINE W REFLEX MICROSCOPIC
Bilirubin Urine: NEGATIVE
Glucose, UA: NEGATIVE mg/dL
Hgb urine dipstick: NEGATIVE
Ketones, ur: 5 mg/dL — AB
Leukocytes,Ua: NEGATIVE
Nitrite: NEGATIVE
Protein, ur: 30 mg/dL — AB
Specific Gravity, Urine: 1.024 (ref 1.005–1.030)
pH: 6 (ref 5.0–8.0)

## 2023-09-27 LAB — COMPREHENSIVE METABOLIC PANEL WITH GFR
ALT: 18 U/L (ref 0–44)
AST: 24 U/L (ref 15–41)
Albumin: 4 g/dL (ref 3.5–5.0)
Alkaline Phosphatase: 51 U/L (ref 38–126)
Anion gap: 10 (ref 5–15)
BUN: 13 mg/dL (ref 6–20)
CO2: 23 mmol/L (ref 22–32)
Calcium: 9.2 mg/dL (ref 8.9–10.3)
Chloride: 108 mmol/L (ref 98–111)
Creatinine, Ser: 1.12 mg/dL — ABNORMAL HIGH (ref 0.44–1.00)
GFR, Estimated: >60 mL/min (ref >60)
Glucose, Bld: 135 mg/dL — ABNORMAL HIGH (ref 70–99)
Potassium: 3.5 mmol/L (ref 3.5–5.1)
Sodium: 141 mmol/L (ref 135–145)
Total Bilirubin: 0.9 mg/dL (ref 0.0–1.2)
Total Protein: 7.9 g/dL (ref 6.5–8.1)

## 2023-09-27 LAB — CBC
HCT: 42.3 % (ref 36.0–46.0)
Hemoglobin: 13.8 g/dL (ref 12.0–15.0)
MCH: 28.7 pg (ref 26.0–34.0)
MCHC: 32.6 g/dL (ref 30.0–36.0)
MCV: 87.9 fL (ref 80.0–100.0)
Platelets: 478 10*3/uL — ABNORMAL HIGH (ref 150–400)
RBC: 4.81 MIL/uL (ref 3.87–5.11)
RDW: 13.5 % (ref 11.5–15.5)
WBC: 14.9 10*3/uL — ABNORMAL HIGH (ref 4.0–10.5)
nRBC: 0 % (ref 0.0–0.2)

## 2023-09-27 LAB — LIPASE, BLOOD: Lipase: 32 U/L (ref 11–51)

## 2023-09-27 LAB — HCG, SERUM, QUALITATIVE: Preg, Serum: NEGATIVE

## 2023-09-27 MED ORDER — METOCLOPRAMIDE HCL 5 MG/ML IJ SOLN
10.0000 mg | Freq: Once | INTRAMUSCULAR | Status: AC
  Administered 2023-09-27: 10 mg via INTRAVENOUS
  Filled 2023-09-27: qty 2

## 2023-09-27 MED ORDER — CAPSAICIN 0.1 % EX CREA: TOPICAL_CREAM | CUTANEOUS | 0 refills | Status: AC

## 2023-09-27 MED ORDER — CAPSAICIN 0.075 % EX CREA
TOPICAL_CREAM | Freq: Two times a day (BID) | CUTANEOUS | Status: DC
  Filled 2023-09-27: qty 57

## 2023-09-27 MED ORDER — SODIUM CHLORIDE 0.9 % IV BOLUS
1000.0000 mL | Freq: Once | INTRAVENOUS | Status: AC
  Administered 2023-09-27: 1000 mL via INTRAVENOUS

## 2023-09-27 MED ORDER — PROMETHAZINE HCL 25 MG RE SUPP: 25.0000 mg | Freq: Four times a day (QID) | RECTAL | 0 refills | Status: AC | PRN

## 2023-09-27 MED ORDER — PANTOPRAZOLE SODIUM 40 MG IV SOLR
40.0000 mg | Freq: Once | INTRAVENOUS | Status: AC
  Administered 2023-09-27: 40 mg via INTRAVENOUS
  Filled 2023-09-27: qty 10

## 2023-09-27 MED ORDER — ONDANSETRON 4 MG PO TBDP: 4.0000 mg | ORAL_TABLET | Freq: Once | ORAL | Status: DC

## 2023-09-27 MED ORDER — DROPERIDOL 2.5 MG/ML IJ SOLN
2.5000 mg | Freq: Once | INTRAMUSCULAR | Status: AC
  Administered 2023-09-27: 2.5 mg via INTRAVENOUS
  Filled 2023-09-27: qty 2

## 2023-09-27 NOTE — ED Notes (Signed)
 Pt. Stated, when she takes Zofran  its makes her sick on her stomach.

## 2023-09-27 NOTE — ED Provider Notes (Cosign Needed)
 [place] EMERGENCY DEPARTMENT AT [facility] Provider Note   CSN: [id number] Arrival date & time: 09/27/23  4098     Patient presents with: Abdominal Pain, Emesis, and Nausea   Anna Moses is a 48 y.o. female with history of schizophrenia and cyclical vomiting who presents ED today for abdominal pain.  Patient reports abdominal pain with nausea and vomiting for the past 4 days.  Has a history of similar symptoms in the past when she was told was due to smoking marijuana.  States that she cut back on that and now only eats gummies.  Denies any new foods or alcohol use prior to onset of symptoms.  No fevers.  Reports both diarrhea and constipation. She has not been able to eat or drink anything secondary to the vomiting.  States that it feels like it gets stuck in her chest when she has to throw up.  She was seen yesterday for the same complaint but states her symptoms have not improved.  She was prescribed Zofran  but continues to throw it up.   Prior to Admission medications   Medication Sig Start Date End Date Taking? Authorizing Provider  Capsaicin 0.1 % CREA Apply as needed to the abdomen for pain relief 09/27/23  Yes Sonnie Dusky, PA-C  promethazine  (PHENERGAN ) 25 MG suppository Place 1 suppository (25 mg total) rectally every 6 (six) hours as needed for nausea or vomiting. 09/27/23 10/27/23 Yes Sonnie Dusky, PA-C  albuterol  (VENTOLIN  HFA) 108 (90 Base) MCG/ACT inhaler Inhale 1-2 puffs into the lungs every 6 (six) hours as needed for wheezing or shortness of breath. 05/18/22   Starlene Eaton, FNP  amLODipine  (NORVASC ) 5 MG tablet Take 1 tablet (5 mg total) by mouth at bedtime. 09/20/23   Goble Last, MD  ARIPiprazole  (ABILIFY ) 5 MG tablet Take 1 tablet (5 mg total) by mouth daily. 08/09/23 08/08/24  Nwoko, Uchenna E, PA  clotrimazole -betamethasone  (LOTRISONE ) cream Apply to affected area on back 2 times daily until improved 12/01/22   Banister, Pamela K, MD  estradiol  cypionate (DEPO-ESTRADIOL) 5 MG/ML injection Inject into the muscle every 28 (twenty-eight) days.    [provider]  fluticasone  (FLONASE ) 50 MCG/ACT nasal spray Place 1 spray into both nostrils daily. 08/02/23   Mayer, Jodi R, NP  hydrOXYzine  (ATARAX ) 25 MG tablet Take 1 tablet (25 mg total) by mouth at bedtime as needed. 05/18/23   Nwoko, Uchenna E, PA  mupirocin  ointment (BACTROBAN ) 2 % Apply 1 Application topically 2 (two) times daily. To affected area that is infected till better 12/01/22   Ann Keto, MD  ondansetron  (ZOFRAN -ODT) 4 MG disintegrating tablet Take 1 tablet (4 mg total) by mouth every 8 (eight) hours as needed for nausea or vomiting. 09/26/23   Geralyn Knee, Angelita Kendall, PA-C  oxyCODONE  (ROXICODONE ) 5 MG immediate release tablet Take 1 tablet (5 mg total) by mouth every 6 (six) hours as needed for severe pain. 12/01/22   Ann Keto, MD  pantoprazole  (PROTONIX ) 20 MG tablet Take 1 tablet (20 mg total) by mouth daily. 09/26/23   Scott, Angelita Kendall, PA-C  promethazine -dextromethorphan (PROMETHAZINE -DM) 6.25-15 MG/5ML syrup Take 5 mLs by mouth 4 (four) times daily as needed for cough. 08/02/23   Mayer, Jodi R, NP  sertraline  (ZOLOFT ) 100 MG tablet Take 1 tablet (100 mg total) by mouth daily. 08/09/23 08/08/24  Nwoko, Uchenna E, PA  diphenhydrAMINE  (BENADRYL ) 25 MG tablet Take 1 tablet (25 mg total) by mouth every 6 (six) hours. Patient  not taking: Reported on 12/12/2018 10/21/17 05/30/19  Wynetta Heckle, MD  Ibuprofen -diphenhydrAMINE  Cit (ADVIL  PM) 200-38 MG TABS Take 2 tablets by mouth 2 (two) times daily as needed (headaches).  05/30/19  [provider]    Allergies: Patient has no known allergies.    Review of Systems  Gastrointestinal:  Positive for vomiting.  All other systems reviewed and are negative.   Updated Vital Signs BP (!) 151/69 (BP Location: Right Arm)   Pulse (!) 55   Temp 98.7 F (37.1 C) (Oral)   Resp 17   LMP  (LMP Unknown) Comment: negative HCG  09/26/23  SpO2 100%   Physical Exam Vitals and nursing note reviewed.  Constitutional:      General: She is not in acute distress.    Appearance: Normal appearance.  HENT:     Head: Normocephalic and atraumatic.     Mouth/Throat:     Mouth: Mucous membranes are moist.   Eyes:     Conjunctiva/sclera: Conjunctivae normal.     Pupils: Pupils are equal, round, and reactive to light.    Cardiovascular:     Rate and Rhythm: Normal rate and regular rhythm.     Pulses: Normal pulses.     Heart sounds: Normal heart sounds.  Pulmonary:     Effort: Pulmonary effort is normal.     Breath sounds: Normal breath sounds.  Abdominal:     Palpations: Abdomen is soft.     Tenderness: There is abdominal tenderness.     Comments: Epigastric tenderness to palpation   Musculoskeletal:        General: Normal range of motion.     Cervical back: Normal range of motion.   Skin:    General: Skin is warm and dry.     Findings: No rash.   Neurological:     General: No focal deficit present.     Mental Status: She is alert.     Sensory: No sensory deficit.     Motor: No weakness.   Psychiatric:        Mood and Affect: Mood normal.        Behavior: Behavior normal.    (all labs ordered are listed, but only abnormal results are displayed) Labs Reviewed  COMPREHENSIVE METABOLIC PANEL WITH GFR - Abnormal; Notable for the following components:      Result Value   Glucose, Bld 135 (*)    Creatinine, Ser 1.12 (*)    All other components within normal limits  CBC - Abnormal; Notable for the following components:   WBC 14.9 (*)    Platelets 478 (*)    All other components within normal limits  URINALYSIS, ROUTINE W REFLEX MICROSCOPIC - Abnormal; Notable for the following components:   APPearance HAZY (*)    Ketones, ur 5 (*)    Protein, ur 30 (*)    Bacteria, UA RARE (*)    All other components within normal limits  LIPASE, BLOOD  HCG, SERUM, QUALITATIVE    EKG: None  Radiology: DG  Chest 1 View Result Date: 09/27/2023 CLINICAL DATA:  Chest pain.  Nausea, vomiting, diarrhea EXAM: CHEST  1 VIEW COMPARISON:  05/20/2022 FINDINGS: The lungs appear clear. Cardiac and mediastinal contours normal. No blunting of the costophrenic angles. No significant bony findings. IMPRESSION: 1. No active cardiopulmonary disease is radiographically apparent. Electronically Signed   By: Freida Jes M.D.   On: 09/27/2023 12:02   CT ABDOMEN PELVIS W CONTRAST Result Date: 09/26/2023 CLINICAL DATA:  abdominal pain, nausea, vomiting, and diarrhea EXAM: CT ABDOMEN AND PELVIS WITH CONTRAST TECHNIQUE: Multidetector CT imaging of the abdomen and pelvis was performed using the standard protocol following bolus administration of intravenous contrast. RADIATION DOSE REDUCTION: This exam was performed according to the departmental dose-optimization program which includes automated exposure control, adjustment of the mA and/or kV according to patient size and/or use of iterative reconstruction technique. CONTRAST:  OMNIPAQUE  IOHEXOL  300 MG/ML  SOLN COMPARISON:  June 17, 2022 FINDINGS: Lower chest: No focal airspace consolidation or pleural effusion. Hepatobiliary: No mass.No radiopaque stones or wall thickening of the gallbladder. No intrahepatic or extrahepatic biliary ductal dilation. The portal veins are patent. Pancreas: No mass or main ductal dilation. No peripancreatic inflammation or fluid collection. Spleen: Normal size. No mass. Adrenals/Urinary Tract: No adrenal masses. No renal mass. No nephrolithiasis or hydronephrosis. The urinary bladder is distended without focal abnormality. Stomach/Bowel: The stomach is decompressed without focal abnormality. No small bowel wall thickening or inflammation. No small bowel obstruction.Normal appendix. Vascular/Lymphatic: No aortic aneurysm. Scattered aortoiliac atherosclerosis. Circumaortic left renal vein. No intraabdominal or pelvic lymphadenopathy. Reproductive:  The uterus and ovaries are within normal limits for patient's age.No free pelvic fluid. Other: No pneumoperitoneum, ascites, or mesenteric inflammation. Musculoskeletal: No acute fracture or destructive lesion. Multilevel degenerative disc disease of the spine. IMPRESSION: 1. No acute intra-abdominal or pelvic abnormality. 2. Hepatic steatosis. Aortic Atherosclerosis (ICD10-I70.0). Electronically Signed   By: Rance Burrows M.D.   On: 09/26/2023 08:45     Procedures   Medications Ordered in the ED  capsicum (ZOSTRIX) 0.075 % cream (has no administration in time range)  sodium chloride  0.9 % bolus 1,000 mL (0 mLs Intravenous Stopped 09/27/23 1253)  metoCLOPramide  (REGLAN ) injection 10 mg (10 mg Intravenous Given 09/27/23 1043)  droperidol  (INAPSINE ) 2.5 MG/ML injection 2.5 mg (2.5 mg Intravenous Given 09/27/23 1111)  pantoprazole  (PROTONIX ) injection 40 mg (40 mg Intravenous Given 09/27/23 1111)  sodium chloride  0.9 % bolus 1,000 mL (1,000 mLs Intravenous New Bag/Given 09/27/23 1310)                                    Medical Decision Making Amount and/or Complexity of Data Reviewed Labs: ordered. Radiology: ordered.  Risk OTC drugs. Prescription drug management.   This patient presents to the ED for concern of vomiting, this involves an extensive number of treatment options, and is a complaint that carries with it a high risk of complications and morbidity.   Differential diagnosis includes: gastritis, gastroenteritis, IBS, IBD, cannabinoid hyperemesis syndrome, electrolyte abnormality, dehydration, etc.   Comorbidities  See HPI above   Additional History  Additional history obtained from prior ED records   Cardiac Monitoring / EKG  The patient was maintained on a cardiac monitor.  I personally viewed and interpreted the cardiac monitored which showed: sinus bradycardia with a heart rate of 53 bpm.   Lab Tests  I ordered and personally interpreted labs.  The pertinent  results include:   WBC of 14.9, otherwise CBC is reassuring Cr of 1.12, otherwise CMP is within normal limits Lipase is unremarkable Negative pregnancy test   Imaging Studies  I ordered imaging studies including CXR  I independently visualized and interpreted imaging which showed:  No acute cardiopulmonary disease I agree with the radiologist interpretation She had at CT of her abdomen yesterday that was reassuring.    Problem List / ED Course / Critical Interventions /  Medication Management  Epigastric pain with N/V/D for the past 4 days. No fevers. Endorses edible use. Denies new foods or alcohol use. She was seen yesterday and her labs and imaging were reassuring. Symptoms still persist so she came back today for management of symptoms. I ordered medications including: Reglan , NS, Protonix , and Droperidol  for abdominal pain/vomiting/dehydration  Reevaluation of the patient after these medicines showed that the patient improved. Patient resting comfortably on reevaluation Patient able to eat crackers without nausea/vomiting. With shared decision making, patient will be discharged home. Return precautions provided.   Social Determinants of Health  Substance use   Test / Admission - Considered  Discussed findings with patient. All questions answered. She is stable and safe for discharge.    Final diagnoses:  Nausea vomiting and diarrhea    ED Discharge Orders          Ordered    Capsaicin 0.1 % CREA        09/27/23 1418    promethazine  (PHENERGAN ) 25 MG suppository  Every 6 hours PRN        09/27/23 1418               Sonnie Dusky, PA-C 09/27/23 1425

## 2023-09-27 NOTE — ED Notes (Signed)
 Pt is actively vomiting and hurting.  Meds given

## 2023-09-27 NOTE — ED Notes (Signed)
Pt provided saltines and ginger ale.

## 2023-09-27 NOTE — Discharge Instructions (Addendum)
 Your labs are sending dehydration but are overall reassuring.  Use capsaicin cream twice a day as needed for your abdominal pain.  Use Phenergan  suppositories every 6 hours as needed for nausea and vomiting. It looks like they sent pantoprazole  to your pharmacy yesterday. Take this once a day as needed for abdominal pain.  Slowly start introducing food into your diet.  You can start with drinking Gatorade and Pedialyte to help stay hydrated.  Get help right away if: You have pain in your chest, neck, arm, or jaw. You feel extremely weak or you faint. You have persistent vomiting. You have vomit that is bright red or looks like black coffee grounds. You have bloody or black stools (feces) or stools that look like tar. You have a severe headache, a stiff neck, or both. You have severe pain, cramping, or bloating in your abdomen. You have difficulty breathing, or you are breathing very quickly. Your heart is beating very quickly. Your skin feels cold and clammy. You feel confused. You have signs of dehydration, such as: Dark urine, very little urine, or no urine. Cracked lips. Dry mouth. Sunken eyes. Sleepiness. Weakness.

## 2023-09-27 NOTE — ED Notes (Signed)
 Pt is resting.

## 2023-09-27 NOTE — ED Triage Notes (Addendum)
 Pt. Stated, I've had N/V/D since Sunday, I went to [facility] on Monday and they gave me Zofran  and fluids.I also went to a UC and they didn't even see me, and told me to go back to hospital. Im still having the same symptoms. Ive not been able to keep anything down. Pt is breathing fast , she stated cause of the nausea.

## 2023-09-27 NOTE — ED Notes (Signed)
 Patient transported to XR.

## 2023-10-02 ENCOUNTER — Encounter: Payer: Self-pay | Admitting: Family Medicine

## 2023-10-04 ENCOUNTER — Encounter: Payer: Self-pay | Admitting: Neurology

## 2023-10-10 ENCOUNTER — Encounter (HOSPITAL_COMMUNITY): Payer: Self-pay

## 2023-10-10 ENCOUNTER — Ambulatory Visit (HOSPITAL_COMMUNITY): Admitting: Mental Health

## 2023-10-11 ENCOUNTER — Ambulatory Visit (INDEPENDENT_AMBULATORY_CARE_PROVIDER_SITE_OTHER): Payer: MEDICAID | Admitting: Mental Health

## 2023-10-11 ENCOUNTER — Encounter (HOSPITAL_COMMUNITY): Admitting: Physician Assistant

## 2023-10-11 DIAGNOSIS — F251 Schizoaffective disorder, depressive type: Secondary | ICD-10-CM

## 2023-10-11 NOTE — Progress Notes (Signed)
   THERAPIST PROGRESS NOTE  Session Time: 2:08 pm ( 40 minutes)  Participation Level: Active  Behavioral Response: CasualAlertEuthymic  Type of Therapy: Individual Therapy  Treatment Goals addressed:  STG: Get better with crowds.Anjanette will increase level of comfort in the community AEB development of coping skills for anxiety with ability to engage in community x 3 weekly for a period of 90 days.   ProgressTowards Goals: Progressing  Interventions: Supportive  Summary: Anna Moses is a 48 y.o. female who presents with dx of schizoaffective disorder, depressive type, generalized anxiety and cannabis use. Francena presents for therapy session alert and oriented; mood and affect stable; euthymic. Shares to be doing well and denies concerns for depression or psychotic sxs. Shares ongoing stability and daily medication compliance. Shares engagement in community and notes presentation to pool and taking grand-children bowling. Notes some anxiety related to presentation to [facility] but shares to also feel excitement. Notes anxiety ongoing with large crowds but is working to increase comfort and explores internal dialogue associated with increased anxiety. Shares thoughts on son being out of jail and other family concerns but denies to allow self to become overly concerned. Shares overall doing well and progress with goals. Denies SI/HI  Suicidal/Homicidal: Nowithout intent/plan  Therapist Response: Therapist engaged Natelie in therapy session. Completed check in and assessed fr current level of stressors, sxs management and level of functioning. Provided safe space for [name] to shares thoughts and feelings on current events and provided supportive feedback. Explored level of self-care and engagement community and level of comfort. Supported in reviewing coping skills for anxiety and use of cognitive coping. Reviewed session and affirmed progress with goals. Provided follow up.   Plan: Return  again in  x 7 weeks.  Diagnosis: Schizoaffective disorder, depressive type (HCC)  Collaboration of Care: Other None  Patient/Guardian was advised Release of Information must be obtained prior to any record release in order to collaborate their care with an outside provider. Patient/Guardian was advised if they have not already done so to contact the registration department to sign all necessary forms in order for us  to release information regarding their care.   Consent: Patient/Guardian gives verbal consent for treatment and assignment of benefits for services provided during this visit. Patient/Guardian expressed understanding and agreed to proceed.   Ty [place], [address] [employer] 10/11/2023

## 2023-10-17 ENCOUNTER — Ambulatory Visit
Admission: RE | Admit: 2023-10-17 | Discharge: 2023-10-17 | Disposition: A | Discharge status: Home / Self Care | Source: Ambulatory Visit | Attending: Family Medicine | Admitting: Family Medicine

## 2023-10-17 DIAGNOSIS — R200 Anesthesia of skin: Secondary | ICD-10-CM

## 2023-10-17 MED ORDER — GADOPICLENOL 0.5 MMOL/ML IV SOLN
10.0000 mL | Freq: Once | INTRAVENOUS | Status: AC | PRN
  Administered 2023-10-17: 10 mL via INTRAVENOUS

## 2023-10-19 ENCOUNTER — Ambulatory Visit
Admission: RE | Admit: 2023-10-19 | Discharge: 2023-10-19 | Disposition: A | Discharge status: Home / Self Care | Source: Ambulatory Visit | Attending: Family Medicine | Admitting: Family Medicine

## 2023-10-19 ENCOUNTER — Ambulatory Visit
Admission: RE | Admit: 2023-10-19 | Discharge: 2023-10-19 | Disposition: A | Discharge status: Home / Self Care | Payer: MEDICAID | Source: Ambulatory Visit | Attending: Family Medicine | Admitting: Family Medicine

## 2023-10-19 DIAGNOSIS — R200 Anesthesia of skin: Secondary | ICD-10-CM

## 2023-10-23 ENCOUNTER — Encounter (HOSPITAL_COMMUNITY): Payer: Self-pay

## 2023-10-23 ENCOUNTER — Telehealth (HOSPITAL_COMMUNITY): Payer: MEDICAID | Admitting: Physician Assistant

## 2023-11-17 ENCOUNTER — Ambulatory Visit
Admission: EM | Admit: 2023-11-17 | Discharge: 2023-11-17 | Disposition: A | Discharge status: Home / Self Care | Payer: MEDICAID | Attending: Family Medicine | Admitting: Family Medicine

## 2023-11-17 DIAGNOSIS — N611 Abscess of the breast and nipple: Secondary | ICD-10-CM | POA: Diagnosis not present

## 2023-11-17 MED ORDER — DOXYCYCLINE HYCLATE 100 MG PO CAPS: 100.0000 mg | ORAL_CAPSULE | Freq: Two times a day (BID) | ORAL | 0 refills | Status: AC

## 2023-11-17 NOTE — ED Triage Notes (Signed)
 Pt present with c/o lt side breast pain. States she had surgery on an abscess that was on the lt breast. Pt reports the wound is draining.  i

## 2023-11-17 NOTE — Discharge Instructions (Addendum)
 Start doxycycline  twice daily for 10 days.  Warm compresses to the area as needed.  Please contact the office where you had your surgery to make them aware that you are having additional issues.  Please go to the ER if you develop any worsening symptoms.  This includes but is not limited to fevers, worsening swelling or pain, or any new concerns that arise.  Hope you feel better soon!

## 2023-11-17 NOTE — ED Provider Notes (Signed)
 [facility]    CSN: [id number] Arrival date & time: 11/17/23  1638      History   Chief Complaint Chief Complaint  Patient presents with   Breast Problem    HPI Anna Moses is a 48 y.o. female with a history of asthma, depression, schizophrenia presents for breast abscess.  Patient reports in April of this year she had to have surgery for a left breast abscess.  Reports over the past 3 days it is returned and states it is draining.  Denies any fevers or chills.  Denies history of MRSA.  She has not been using any OTC treatments for symptoms.  No other concerns at this time  HPI  Past Medical History:  Diagnosis Date   Asthma    Bronchitis    Depression    Obesity    Schizophrenia [facility])     Patient Active Problem List   Diagnosis Date Noted   PTSD (post-traumatic stress disorder) 12/28/2022   Insomnia due to other mental disorder 12/28/2022   Schizoaffective disorder, depressive type (HCC) 10/25/2022   Generalized anxiety disorder 10/25/2022   Marijuana use 10/25/2022   Foreign body in left ear 10/08/2021   Hematuria 09/09/2021   Left breast abscess 08/29/2021   Lower back pain 07/14/2021   Contraception management 11/17/2020   Depression, recurrent (HCC) 11/17/2020   Cervical cancer screening 09/18/2019   Routine screening for STI (sexually transmitted infection) 09/18/2019   Cyclical vomiting 09/03/2019   Schizophrenia (HCC) 07/24/2019   H. pylori infection 07/24/2019   Seasonal allergies 07/24/2019   Dysphagia 06/03/2019   Nausea and vomiting 06/03/2019   Liver lesion 06/03/2019    Past Surgical History:  Procedure Laterality Date   BREAST BIOPSY Left 09/13/2021   BREAST DUCTAL SYSTEM EXCISION Left 11/17/2022   Procedure: LEFT BREAST CENTRAL DUCT EXCISION;  Surgeon: Vanderbilt Ned, MD;  Location: [facility];  Service: General;  Laterality: Left;   CESAREAN SECTION     CHONDROPLASTY Left 06/02/2022   Procedure:  CHONDROPLASTY;  Surgeon: Cristy Bonner DASEN, MD;  Location: [facility];  Service: Orthopedics;  Laterality: Left;   COLONOSCOPY  06/27/2019   KNEE ARTHROSCOPY WITH LATERAL RELEASE Left 06/02/2022   Procedure: KNEE ARTHROSCOPY WITH LATERAL RELEASE;  Surgeon: Cristy Bonner DASEN, MD;  Location: [facility];  Service: Orthopedics;  Laterality: Left;   KNEE ARTHROSCOPY WITH MEDIAL MENISECTOMY Left 06/02/2022   Procedure: KNEE ARTHROSCOPY WITH MEDIAL MENISECTOMY;  Surgeon: Cristy Bonner DASEN, MD;  Location: [facility];  Service: Orthopedics;  Laterality: Left;   UPPER GASTROINTESTINAL ENDOSCOPY  06/27/2019    OB History   No obstetric history on file.      Home Medications    Prior to Admission medications   Medication Sig Start Date End Date Taking? Authorizing Provider  doxycycline  (VIBRAMYCIN ) 100 MG capsule Take 1 capsule (100 mg total) by mouth 2 (two) times daily. 11/17/23  Yes Geana Walts, Jodi R, NP  albuterol  (VENTOLIN  HFA) 108 (90 Base) MCG/ACT inhaler Inhale 1-2 puffs into the lungs every 6 (six) hours as needed for wheezing or shortness of breath. 05/18/22   Enedelia Dorna HERO, FNP  amLODipine  (NORVASC ) 5 MG tablet Take 1 tablet (5 mg total) by mouth at bedtime. 09/20/23   Rosendo Rush, MD  ARIPiprazole  (ABILIFY ) 5 MG tablet Take 1 tablet (5 mg total) by mouth daily. 08/09/23 08/08/24  Nwoko, Uchenna E, PA  Capsaicin  0.1 % CREA Apply as needed to the abdomen  for pain relief 09/27/23   Waddell Sluder, PA-C  clotrimazole -betamethasone  (LOTRISONE ) cream Apply to affected area on back 2 times daily until improved 12/01/22   Banister, Pamela K, MD  estradiol cypionate (DEPO-ESTRADIOL) 5 MG/ML injection Inject into the muscle every 28 (twenty-eight) days.    [provider]  fluticasone  (FLONASE ) 50 MCG/ACT nasal spray Place 1 spray into both nostrils daily. 08/02/23   Geddy Boydstun, Jodi R, NP  hydrOXYzine  (ATARAX ) 25 MG tablet Take 1 tablet (25 mg total) by mouth at  bedtime as needed. 05/18/23   Nwoko, Uchenna E, PA  mupirocin  ointment (BACTROBAN ) 2 % Apply 1 Application topically 2 (two) times daily. To affected area that is infected till better 12/01/22   Vonna Sharlet POUR, MD  ondansetron  (ZOFRAN -ODT) 4 MG disintegrating tablet Take 1 tablet (4 mg total) by mouth every 8 (eight) hours as needed for nausea or vomiting. 09/26/23   Glendia, Rocky SAILOR, PA-C  oxyCODONE  (ROXICODONE ) 5 MG immediate release tablet Take 1 tablet (5 mg total) by mouth every 6 (six) hours as needed for severe pain. 12/01/22   Vonna Sharlet POUR, MD  pantoprazole  (PROTONIX ) 20 MG tablet Take 1 tablet (20 mg total) by mouth daily. 09/26/23   Glendia Rocky SAILOR, PA-C  promethazine  (PHENERGAN ) 25 MG suppository Place 1 suppository (25 mg total) rectally every 6 (six) hours as needed for nausea or vomiting. 09/27/23 10/27/23  Waddell Sluder, PA-C  promethazine -dextromethorphan (PROMETHAZINE -DM) 6.25-15 MG/5ML syrup Take 5 mLs by mouth 4 (four) times daily as needed for cough. 08/02/23   Maxwel Meadowcroft, Jodi R, NP  sertraline  (ZOLOFT ) 100 MG tablet Take 1 tablet (100 mg total) by mouth daily. 08/09/23 08/08/24  Nwoko, Uchenna E, PA  diphenhydrAMINE  (BENADRYL ) 25 MG tablet Take 1 tablet (25 mg total) by mouth every 6 (six) hours. Patient not taking: Reported on 12/12/2018 10/21/17 05/30/19  Armenta Canning, MD  Ibuprofen -diphenhydrAMINE  Cit (ADVIL  PM) 200-38 MG TABS Take 2 tablets by mouth 2 (two) times daily as needed (headaches).  05/30/19  [provider]    Family History Family History  Problem Relation Age of Onset   Crohn's disease Mother    Liver disease Father    Clotting disorder Father    Liver disease Brother    Colon cancer Neg Hx    Esophageal cancer Neg Hx    Rectal cancer Neg Hx    Stomach cancer Neg Hx    Breast cancer Neg Hx     Social History Social History   Tobacco Use   Smoking status: Some Days    Current packs/day: 0.50    Average packs/day: 0.5 packs/day for 30.6 years (15.3  ttl pk-yrs)    Types: Cigarettes    Start date: 1995    Passive exposure: Current   Smokeless tobacco: Never  Vaping Use   Vaping status: Never Used  Substance Use Topics   Alcohol use: Not Currently   Drug use: Not Currently    Types: Marijuana    Comment: occ     Allergies   Patient has no known allergies.   Review of Systems Review of Systems  Skin:        Breast abscess     Physical Exam Triage Vital Signs ED Triage Vitals  Encounter Vitals Group     BP 11/17/23 1647 126/84     Girls Systolic BP Percentile --      Girls Diastolic BP Percentile --      Boys Systolic BP Percentile --  Boys Diastolic BP Percentile --      Pulse Rate 11/17/23 1647 (!) 112     Resp 11/17/23 1647 18     Temp 11/17/23 1647 98.9 F (37.2 C)     Temp Source 11/17/23 1647 Oral     SpO2 11/17/23 1647 96 %     Weight --      Height --      Head Circumference --      Peak Flow --      Pain Score 11/17/23 1646 10     Pain Loc --      Pain Education --      Exclude from Growth Chart --    No data found.  Updated Vital Signs BP 126/84 (BP Location: Right Arm)   Pulse (!) 105   Temp 98.9 F (37.2 C) (Oral)   Resp 18   LMP  (Exact Date)   SpO2 96%   Visual Acuity Right Eye Distance:   Left Eye Distance:   Bilateral Distance:    Right Eye Near:   Left Eye Near:    Bilateral Near:     Physical Exam Vitals and nursing note reviewed. Exam conducted with a chaperone present (Adrianna CMA).  Constitutional:      General: She is not in acute distress.    Appearance: Normal appearance. She is obese. She is not ill-appearing.  HENT:     Head: Normocephalic and atraumatic.  Eyes:     Pupils: Pupils are equal, round, and reactive to light.  Cardiovascular:     Rate and Rhythm: Regular rhythm. Tachycardia present.     Heart sounds: Normal heart sounds.  Pulmonary:     Effort: Pulmonary effort is normal.     Breath sounds: Normal breath sounds.  Chest:       Comments:  There is a small abrasion to the 9 o'clock position of the left nipple with induration on palpation overlying the nipple.  No fluctuance.  No active drainage, erythema or warmth.  Area is very tender to palpation.  No nipple drainage or inversion. Skin:    General: Skin is warm and dry.  Neurological:     General: No focal deficit present.     Mental Status: She is alert and oriented to person, place, and time.  Psychiatric:        Mood and Affect: Mood normal.        Behavior: Behavior normal.      UC Treatments / Results  Labs (all labs ordered are listed, but only abnormal results are displayed) Labs Reviewed - No data to display  EKG   Radiology No results found.  Procedures Procedures (including critical care time)  Medications Ordered in UC Medications - No data to display  Initial Impression / Assessment and Plan / UC Course  I have reviewed the triage vital signs and the nursing notes.  Pertinent labs & imaging results that were available during my care of the patient were reviewed by me and considered in my medical decision making (see chart for details).     Reviewed exam and symptoms with patient.  Will start doxycycline  and encouraged warm compresses.  Advised her to contact the office where she had her surgery previously to make them aware of reoccurrence.  Strict ER precautions reviewed and patient verbalized understanding. Final Clinical Impressions(s) / UC Diagnoses   Final diagnoses:  Left breast abscess     Discharge Instructions      Start doxycycline   twice daily for 10 days.  Warm compresses to the area as needed.  Please contact the office where you had your surgery to make them aware that you are having additional issues.  Please go to the ER if you develop any worsening symptoms.  This includes but is not limited to fevers, worsening swelling or pain, or any new concerns that arise.  Hope you feel better soon!     ED Prescriptions      Medication Sig Dispense Auth. Provider   doxycycline  (VIBRAMYCIN ) 100 MG capsule Take 1 capsule (100 mg total) by mouth 2 (two) times daily. 20 capsule Iana Buzan, Jodi R, NP      PDMP not reviewed this encounter.   Loreda Myla SAUNDERS, NP 11/17/23 [phone]

## 2023-11-18 ENCOUNTER — Inpatient Hospital Stay
Admission: RE | Admit: 2023-11-18 | Discharge: 2023-11-18 | Disposition: A | Discharge status: Home / Self Care | Payer: MEDICAID | Source: Ambulatory Visit | Attending: Family Medicine | Admitting: Family Medicine

## 2023-11-18 ENCOUNTER — Other Ambulatory Visit: Payer: Self-pay

## 2023-11-21 ENCOUNTER — Ambulatory Visit (INDEPENDENT_AMBULATORY_CARE_PROVIDER_SITE_OTHER): Payer: MEDICAID | Admitting: Neurology

## 2023-11-21 ENCOUNTER — Encounter: Payer: Self-pay | Admitting: Neurology

## 2023-11-21 VITALS — BP 139/84 | HR 92 | Ht 61.0 in | Wt 261.0 lb

## 2023-11-21 DIAGNOSIS — M5431 Sciatica, right side: Secondary | ICD-10-CM

## 2023-11-21 DIAGNOSIS — R200 Anesthesia of skin: Secondary | ICD-10-CM | POA: Diagnosis not present

## 2023-11-21 DIAGNOSIS — R202 Paresthesia of skin: Secondary | ICD-10-CM

## 2023-11-21 NOTE — Patient Instructions (Signed)
 Nerve testing of the right arm and leg MRI cervical spine Referral to start physical therapy for right leg pain You may try using over the counter wrist brace at nighttime to see if this helps

## 2023-11-21 NOTE — Progress Notes (Signed)
 [facility] Clinic Note - Initial Visit   Date: 11/21/2023   Anna Moses MRN: [id number] DOB: [birth year]   Dear Dr. Donzetta:  Thank you for your kind referral of Sheri Geneve Kimpel for consultation of right side numbness/tingling. Although her history is well known to you, please allow us  to reiterate it for the purpose of our medical record. The patient was accompanied to the clinic by self.   Kyandra Chela Sutphen is a 48 y.o. right-handed female with schizophrenia, depression, GERD, and asthma presenting for evaluation of right side numbness/tingling.   IMPRESSION: Right upper extremity paresthesias, ?carpal tunnel syndrome vs. cervical radiculopathy.  Absence of UMN findings makes cord pathology less likely. Right lower leg paresthesias, ?sciatica.  MRI lumbar spine was reviewed and shows no evidence of S1 nerve impingement.   PLAN: NCS/EMG of the right arm and leg MRI cervical spine wwo contrast Start PT for low back strengthening Trial of wrist brace at night time  Further recommendations pending results.   ------------------------------------------------------------- History of present illness: Starting in early 2025, she began having numbness involving the right arm and leg, which occurs every other day.  The numbness is primarily in the hand and down the back of her thigh.  Her right hand wakes her up from sleeping every night.  She also has shooting pain down the right leg and cramps.  She has intermittent weakness on the right side.  She reports having a few falls from her legs giving out this year.  She has not completed physical therapy for her back or leg symptoms.    She smokes 0.5PPD.  Does not drink alcohol.  She is not working and lives at home with her daughter and grandchildren.    Out-side paper records, electronic medical record, and images have been reviewed where available and summarized as:  MRI thoracic and lumbar wwo  spine 10/18/2023: 1. No signal abnormality or enhancement of the thoracic cord. 2. 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 Moderate right and mild left neuroforaminal stenosis at this level.     Lab Results  Component Value Date   HGBA1C 5.4 08/20/2019   Lab Results  Component Value Date   VITAMINB12 233 06/21/2019   Lab Results  Component Value Date   TSH 1.070 09/20/2023   Lab Results  Component Value Date   ESRSEDRATE 22 06/07/2019    Past Medical History:  Diagnosis Date   Asthma    Bronchitis    Depression    Obesity    Schizophrenia (HCC)     Past Surgical History:  Procedure Laterality Date   BREAST BIOPSY Left 09/13/2021   BREAST DUCTAL SYSTEM EXCISION Left 11/17/2022   Procedure: LEFT BREAST CENTRAL DUCT EXCISION;  Surgeon: Vanderbilt Ned, MD;  Location: [facility];  Service: General;  Laterality: Left;   CESAREAN SECTION     CHONDROPLASTY Left 06/02/2022   Procedure: CHONDROPLASTY;  Surgeon: Cristy Bonner DASEN, MD;  Location: [facility];  Service: Orthopedics;  Laterality: Left;   COLONOSCOPY  06/27/2019   KNEE ARTHROSCOPY WITH LATERAL RELEASE Left 06/02/2022   Procedure: KNEE ARTHROSCOPY WITH LATERAL RELEASE;  Surgeon: Cristy Bonner DASEN, MD;  Location: [facility];  Service: Orthopedics;  Laterality: Left;   KNEE ARTHROSCOPY WITH MEDIAL MENISECTOMY Left 06/02/2022   Procedure: KNEE ARTHROSCOPY WITH MEDIAL MENISECTOMY;  Surgeon: Cristy Bonner DASEN, MD;  Location: [facility];  Service: Orthopedics;  Laterality: Left;   UPPER GASTROINTESTINAL ENDOSCOPY  06/27/2019     Medications:  Outpatient Encounter Medications as of 11/21/2023  Medication Sig   albuterol  (VENTOLIN  HFA) 108 (90 Base) MCG/ACT inhaler Inhale 1-2 puffs into the lungs every 6 (six) hours as needed for wheezing or shortness of breath.   amLODipine  (NORVASC ) 5 MG tablet Take 1 tablet (5 mg total) by  mouth at bedtime.   ARIPiprazole  (ABILIFY ) 5 MG tablet Take 1 tablet (5 mg total) by mouth daily.   clotrimazole -betamethasone  (LOTRISONE ) cream Apply to affected area on back 2 times daily until improved   doxycycline  (VIBRAMYCIN ) 100 MG capsule Take 1 capsule (100 mg total) by mouth 2 (two) times daily.   estradiol cypionate (DEPO-ESTRADIOL) 5 MG/ML injection Inject into the muscle every 28 (twenty-eight) days.   fluticasone  (FLONASE ) 50 MCG/ACT nasal spray Place 1 spray into both nostrils daily.   hydrOXYzine  (ATARAX ) 25 MG tablet Take 1 tablet (25 mg total) by mouth at bedtime as needed.   mupirocin  ointment (BACTROBAN ) 2 % Apply 1 Application topically 2 (two) times daily. To affected area that is infected till better   pantoprazole  (PROTONIX ) 20 MG tablet Take 1 tablet (20 mg total) by mouth daily.   sertraline  (ZOLOFT ) 100 MG tablet Take 1 tablet (100 mg total) by mouth daily.   Capsaicin  0.1 % CREA Apply as needed to the abdomen for pain relief (Patient not taking: Reported on 11/21/2023)   ondansetron  (ZOFRAN -ODT) 4 MG disintegrating tablet Take 1 tablet (4 mg total) by mouth every 8 (eight) hours as needed for nausea or vomiting. (Patient not taking: Reported on 11/21/2023)   oxyCODONE  (ROXICODONE ) 5 MG immediate release tablet Take 1 tablet (5 mg total) by mouth every 6 (six) hours as needed for severe pain. (Patient not taking: Reported on 11/21/2023)   promethazine  (PHENERGAN ) 25 MG suppository Place 1 suppository (25 mg total) rectally every 6 (six) hours as needed for nausea or vomiting. (Patient not taking: Reported on 11/21/2023)   promethazine -dextromethorphan (PROMETHAZINE -DM) 6.25-15 MG/5ML syrup Take 5 mLs by mouth 4 (four) times daily as needed for cough. (Patient not taking: Reported on 11/21/2023)   [DISCONTINUED] diphenhydrAMINE  (BENADRYL ) 25 MG tablet Take 1 tablet (25 mg total) by mouth every 6 (six) hours. (Patient not taking: Reported on 11/21/2023)   [DISCONTINUED]  Ibuprofen -diphenhydrAMINE  Cit (ADVIL  PM) 200-38 MG TABS Take 2 tablets by mouth 2 (two) times daily as needed (headaches).   No facility-administered encounter medications on file as of 11/21/2023.    Allergies: No Known Allergies  Family History: Family History  Problem Relation Age of Onset   Crohn's disease Mother    Liver disease Father    Clotting disorder Father    Liver cancer Father    Liver disease Brother        Liver transplant   Colon cancer Neg Hx    Esophageal cancer Neg Hx    Rectal cancer Neg Hx    Stomach cancer Neg Hx    Breast cancer Neg Hx     Social History: Social History   Tobacco Use   Smoking status: Some Days    Current packs/day: 0.50    Average packs/day: 0.5 packs/day for 30.6 years (15.3 ttl pk-yrs)    Types: Cigarettes    Start date: 1995    Passive exposure: Current   Smokeless tobacco: Never  Vaping Use   Vaping status: Never Used  Substance Use Topics   Alcohol use: Not Currently   Drug use: Yes  Types: Marijuana    Comment: occasional   Social History   Social History Narrative   Are you right handed or left handed? Right handed   Are you currently employed ? No    What is your current occupation? No    Do you live at home alone? No    Who lives with you? Family    What type of home do you live in: 1 story or 2 story? Lives in a two story home        Vital Signs:  BP 139/84   Pulse 92   Ht 5' 1 (1.549 m)   Wt 261 lb (118.4 kg)   SpO2 98%   BMI 49.32 kg/m   Neurological Exam: MENTAL STATUS including orientation to time, place, person, recent and remote memory, attention span and concentration, language, and fund of knowledge is normal.  Speech is not dysarthric.  CRANIAL NERVES: II:  No visual field defects.     III-IV-VI: Pupils equal round and reactive to light.  Normal conjugate, extra-ocular eye movements in all directions of gaze.  No nystagmus.  No ptosis.   V:  Normal facial sensation.    VII:  Normal  facial symmetry and movements.   VIII:  Normal hearing and vestibular function.   IX-X:  Normal palatal movement.   XI:  Normal shoulder shrug and head rotation.   XII:  Normal tongue strength and range of motion, no deviation or fasciculation.  MOTOR:  No atrophy, fasciculations or abnormal movements.  No pronator drift.   Upper Extremity:  Right  Left  Deltoid  5/5   5/5   Biceps  5/5   5/5   Triceps  5/5   5/5   Wrist extensors  5/5   5/5   Wrist flexors  5/5   5/5   Finger extensors  5/5   5/5   Finger flexors  5/5   5/5   Dorsal interossei  5/5   5/5   Abductor pollicis  5/5   5/5   Tone (Ashworth scale)  0  0   Lower Extremity:  Right  Left  Hip flexors  5/5   5/5   Knee flexors  5/5   5/5   Knee extensors  5/5   5/5   Dorsiflexors  5/5   5/5   Plantarflexors  5/5   5/5   Toe extensors  5/5   5/5   Toe flexors  5/5   5/5   Tone (Ashworth scale)  0  0   MSRs:                                           Right        Left brachioradialis 2+  2+  biceps 2+  2+  triceps 2+  2+  patellar 2+  2+  ankle jerk 2+  2+  Hoffman no  no  plantar response down  down   SENSORY:  Normal and symmetric perception of light touch, pinprick, vibration, and temperature.    COORDINATION/GAIT: Normal finger-to- nose-finger.  Intact rapid alternating movements bilaterally.  Gait is antalgic appearing, stable, unassisted.     Thank you for allowing me to participate in patient's care.  If I can answer any additional questions, I would be pleased to do so.    Sincerely,    Isadore Bokhari K. Tobie,  DO

## 2023-12-06 ENCOUNTER — Ambulatory Visit (HOSPITAL_COMMUNITY): Payer: MEDICAID | Admitting: Mental Health

## 2023-12-06 ENCOUNTER — Encounter (HOSPITAL_COMMUNITY): Payer: Self-pay

## 2023-12-19 ENCOUNTER — Telehealth (INDEPENDENT_AMBULATORY_CARE_PROVIDER_SITE_OTHER): Payer: MEDICAID | Admitting: Physician Assistant

## 2023-12-19 DIAGNOSIS — F5105 Insomnia due to other mental disorder: Secondary | ICD-10-CM

## 2023-12-19 DIAGNOSIS — Z79899 Other long term (current) drug therapy: Secondary | ICD-10-CM

## 2023-12-19 DIAGNOSIS — F1290 Cannabis use, unspecified, uncomplicated: Secondary | ICD-10-CM

## 2023-12-19 DIAGNOSIS — F4310 Post-traumatic stress disorder, unspecified: Secondary | ICD-10-CM

## 2023-12-19 DIAGNOSIS — F251 Schizoaffective disorder, depressive type: Secondary | ICD-10-CM

## 2023-12-19 DIAGNOSIS — F411 Generalized anxiety disorder: Secondary | ICD-10-CM

## 2023-12-19 DIAGNOSIS — F99 Mental disorder, not otherwise specified: Secondary | ICD-10-CM

## 2023-12-19 MED ORDER — SERTRALINE HCL 100 MG PO TABS: 100.0000 mg | ORAL_TABLET | Freq: Every day | ORAL | 2 refills | Status: DC

## 2023-12-19 MED ORDER — ARIPIPRAZOLE 5 MG PO TABS: 5.0000 mg | ORAL_TABLET | Freq: Every day | ORAL | 2 refills | Status: DC

## 2023-12-19 MED ORDER — HYDROXYZINE HCL 50 MG PO TABS: 50.0000 mg | ORAL_TABLET | Freq: Every evening | ORAL | 1 refills | Status: AC | PRN

## 2023-12-19 NOTE — Progress Notes (Signed)
 BH MD/PA/NP OP Progress Note  Virtual Visit via Video Note  I connected with Anna Moses on 12/19/23 at  2:00 PM EDT by a video enabled telemedicine application and verified that I am speaking with the correct person using two identifiers.  Location: Patient: Home Provider: Clinic   I discussed the limitations of evaluation and management by telemedicine and the availability of in person appointments. The patient expressed understanding and agreed to proceed.  Follow Up Instructions:  I discussed the assessment and treatment plan with the patient. The patient was provided an opportunity to ask questions and all were answered. The patient agreed with the plan and demonstrated an understanding of the instructions.   The patient was advised to call back or seek an in-person evaluation if the symptoms worsen or if the condition fails to improve as anticipated.  I provided 18 minutes of non-face-to-face time during this encounter.  Reginia FORBES Bolster, PA    12/19/2023 2:00 PM Anna Moses  MRN:  [id number]  Chief Complaint:  Chief Complaint  Patient presents with   Follow-up   Medication Management   HPI:   Anna Raynor. Moses is a 48 year old female with a past psychiatric history significant for for schizoaffective disorder (depressive type), generalized anxiety disorder, and PTSD presents to [facility] via virtual video visit for follow-up and medication management.  Patient is currently being managed on the following psychiatric medications:  Abilify  5 mg daily Sertraline  100 mg daily Hydroxyzine  25 mg at bedtime  Patient presents to the encounter stating that she has been doing well on her medications but is about to run out.  Patient denies experiencing any adverse side effects regarding her current medication regimen.  Patient reports that she experienced some depression last week but states that today, her symptoms are  minimal.  Patient endorses depressive episodes 2 days out of the week.  Patient endorses the following depressive symptoms: numbness and decreased emotions.  Patient denies anxiety.  She denies paranoia and further denies auditory or visual hallucinations.  A PHQ-9 screen was performed with the patient scoring a 10.  A GAD-7 screen was also performed with the patient scoring a 12.  Patient is alert and oriented x 4, calm, cooperative, and fully engaged in conversation during the encounter.  Patient endorses okay mood.  Patient exhibits euthymic mood with appropriate affect.  Patient denies suicidal or homicidal ideations. she further denies auditory or visual hallucinations and does not appear to be responding to any internal/external stimuli.  Patient endorses fair sleep and receives on average 4 to 5 hours of sleep per night.  Patient reports that she is only able to sleep 1 to 2 hours at a time.  Patient endorses fair appetite and eats on average 2 meals per day.  Patient denies alcohol consumption.  Patient endorses tobacco use and smokes on average pack per day.  Patient endorses illicit drug use in the form of marijuana.  Visit Diagnosis:    ICD-10-CM   1. Long term current use of antipsychotic medication  Z79.899     2. Insomnia due to other mental disorder  F51.05 hydrOXYzine  (ATARAX ) 50 MG tablet   F99     3. Schizoaffective disorder, depressive type (HCC)  F25.1 sertraline  (ZOLOFT ) 100 MG tablet    ARIPiprazole  (ABILIFY ) 5 MG tablet    4. Generalized anxiety disorder  F41.1 sertraline  (ZOLOFT ) 100 MG tablet    5. PTSD (post-traumatic stress disorder)  F43.10 sertraline  (ZOLOFT )  100 MG tablet    6. Marijuana use  F12.90       Past Psychiatric History:  Patient endorses a past psychiatric history significant for schizophrenia   Patient endorses a past history of hospitalization due to mental health.  Patient reports that she was last hospitalized a few years ago.   Patient endorses  a past history of suicide attempt stating that she last attempted several years ago.   Patient denies a past history of homicide attempt  Past Medical History:  Past Medical History:  Diagnosis Date   Asthma    Bronchitis    Depression    Obesity    Schizophrenia (HCC)     Past Surgical History:  Procedure Laterality Date   BREAST BIOPSY Left 09/13/2021   BREAST DUCTAL SYSTEM EXCISION Left 11/17/2022   Procedure: LEFT BREAST CENTRAL DUCT EXCISION;  Surgeon: Vanderbilt Ned, MD;  Location: [facility];  Service: General;  Laterality: Left;   CESAREAN SECTION     CHONDROPLASTY Left 06/02/2022   Procedure: CHONDROPLASTY;  Surgeon: Cristy Bonner DASEN, MD;  Location: [facility];  Service: Orthopedics;  Laterality: Left;   COLONOSCOPY  06/27/2019   KNEE ARTHROSCOPY WITH LATERAL RELEASE Left 06/02/2022   Procedure: KNEE ARTHROSCOPY WITH LATERAL RELEASE;  Surgeon: Cristy Bonner DASEN, MD;  Location: [facility];  Service: Orthopedics;  Laterality: Left;   KNEE ARTHROSCOPY WITH MEDIAL MENISECTOMY Left 06/02/2022   Procedure: KNEE ARTHROSCOPY WITH MEDIAL MENISECTOMY;  Surgeon: Cristy Bonner DASEN, MD;  Location: [facility];  Service: Orthopedics;  Laterality: Left;   UPPER GASTROINTESTINAL ENDOSCOPY  06/27/2019    Family Psychiatric History:  Mother - unsure of diagnosis Sister - unsure of diagnosis Brother - unsure of diagnosis   Family history of suicide attempt: Patient denies Family history of homicide attempt: Patient denies Family history of substance abuse: Patient reports that her mother, father, brother, and sister abused substances  Family History:  Family History  Problem Relation Age of Onset   Crohn's disease Mother    Liver disease Father    Clotting disorder Father    Liver cancer Father    Liver disease Brother        Liver transplant   Colon cancer Neg Hx    Esophageal cancer Neg Hx    Rectal cancer Neg Hx    Stomach  cancer Neg Hx    Breast cancer Neg Hx     Social History:  Social History   Socioeconomic History   Marital status: Single    Spouse name: Not on file   Number of children: 4   Years of education: Not on file   Highest education level: Not on file  Occupational History   Occupation: stay at home mother  Tobacco Use   Smoking status: Some Days    Current packs/day: 0.50    Average packs/day: 0.5 packs/day for 30.7 years (15.3 ttl pk-yrs)    Types: Cigarettes    Start date: 1995    Passive exposure: Current   Smokeless tobacco: Never  Vaping Use   Vaping status: Never Used  Substance and Sexual Activity   Alcohol use: Not Currently   Drug use: Yes    Types: Marijuana    Comment: occasional   Sexual activity: Not Currently    Birth control/protection: None  Other Topics Concern   Not on file  Social History Narrative   Are you right handed or left handed? Right handed  Are you currently employed ? No    What is your current occupation? No    Do you live at home alone? No    Who lives with you? Family    What type of home do you live in: 1 story or 2 story? Lives in a two story home       Social Drivers of Health   Financial Resource Strain: Medium Risk (10/25/2022)   Overall Financial Resource Strain (CARDIA)    Difficulty of Paying Living Expenses: Somewhat hard  Food Insecurity: No Food Insecurity (10/25/2022)   Hunger Vital Sign    Worried About Running Out of Food in the Last Year: Never true    Ran Out of Food in the Last Year: Never true  Transportation Needs: Unmet Transportation Needs (10/25/2022)   PRAPARE - Transportation    Lack of Transportation (Medical): Yes    Lack of Transportation (Non-Medical): Yes  Physical Activity: Inactive (10/25/2022)   Exercise Vital Sign    Days of Exercise per Week: 0 days    Minutes of Exercise per Session: 0 min  Stress: Stress Concern Present (10/25/2022)   [employer] of Occupational Health - Occupational  Stress Questionnaire    Feeling of Stress : Very much  Social Connections: Socially Isolated (10/25/2022)   Social Connection and Isolation Panel    Frequency of Communication with Friends and Family: More than three times a week    Frequency of Social Gatherings with Friends and Family: Never    Attends Religious Services: Never    Database administrator or Organizations: No    Attends Engineer, structural: Never    Marital Status: Never married    Allergies: No Known Allergies  Metabolic Disorder Labs: Lab Results  Component Value Date   HGBA1C 5.4 08/20/2019   No results found for: PROLACTIN Lab Results  Component Value Date   CHOL 189 09/20/2023   TRIG 87 09/20/2023   HDL 40 09/20/2023   CHOLHDL 4.7 (H) 09/20/2023   LDLCALC 133 (H) 09/20/2023   Lab Results  Component Value Date   TSH 1.070 09/20/2023    Therapeutic Level Labs: No results found for: LITHIUM No results found for: VALPROATE No results found for: CBMZ  Current Medications: Current Outpatient Medications  Medication Sig Dispense Refill   albuterol  (VENTOLIN  HFA) 108 (90 Base) MCG/ACT inhaler Inhale 1-2 puffs into the lungs every 6 (six) hours as needed for wheezing or shortness of breath. 8 g 0   amLODipine  (NORVASC ) 5 MG tablet Take 1 tablet (5 mg total) by mouth at bedtime. 90 tablet 3   ARIPiprazole  (ABILIFY ) 5 MG tablet Take 1 tablet (5 mg total) by mouth daily. 30 tablet 2   Capsaicin  0.1 % CREA Apply as needed to the abdomen for pain relief (Patient not taking: Reported on 11/21/2023) 60 g 0   clotrimazole -betamethasone  (LOTRISONE ) cream Apply to affected area on back 2 times daily until improved 15 g 0   doxycycline  (VIBRAMYCIN ) 100 MG capsule Take 1 capsule (100 mg total) by mouth 2 (two) times daily. 20 capsule 0   estradiol cypionate (DEPO-ESTRADIOL) 5 MG/ML injection Inject into the muscle every 28 (twenty-eight) days.     fluticasone  (FLONASE ) 50 MCG/ACT nasal spray Place 1  spray into both nostrils daily. 15.8 mL 0   hydrOXYzine  (ATARAX ) 50 MG tablet Take 1 tablet (50 mg total) by mouth at bedtime as needed. 30 tablet 1   mupirocin  ointment (BACTROBAN ) 2 % Apply 1 Application  topically 2 (two) times daily. To affected area that is infected till better 22 g 0   ondansetron  (ZOFRAN -ODT) 4 MG disintegrating tablet Take 1 tablet (4 mg total) by mouth every 8 (eight) hours as needed for nausea or vomiting. (Patient not taking: Reported on 11/21/2023) 20 tablet 0   oxyCODONE  (ROXICODONE ) 5 MG immediate release tablet Take 1 tablet (5 mg total) by mouth every 6 (six) hours as needed for severe pain. (Patient not taking: Reported on 11/21/2023) 12 tablet 0   pantoprazole  (PROTONIX ) 20 MG tablet Take 1 tablet (20 mg total) by mouth daily. 30 tablet 0   promethazine  (PHENERGAN ) 25 MG suppository Place 1 suppository (25 mg total) rectally every 6 (six) hours as needed for nausea or vomiting. (Patient not taking: Reported on 11/21/2023) 120 each 0   promethazine -dextromethorphan (PROMETHAZINE -DM) 6.25-15 MG/5ML syrup Take 5 mLs by mouth 4 (four) times daily as needed for cough. (Patient not taking: Reported on 11/21/2023) 118 mL 0   sertraline  (ZOLOFT ) 100 MG tablet Take 1 tablet (100 mg total) by mouth daily. 30 tablet 2   No current facility-administered medications for this visit.     Musculoskeletal: Strength & Muscle Tone: within normal limits Gait & Station: normal Patient leans: N/A  Psychiatric Specialty Exam: Review of Systems  Psychiatric/Behavioral:  Positive for dysphoric mood and sleep disturbance. Negative for decreased concentration, hallucinations, self-injury and suicidal ideas. The patient is nervous/anxious. The patient is not hyperactive.     There were no vitals taken for this visit.There is no height or weight on file to calculate BMI.  General Appearance: Casual  Eye Contact:  Good  Speech:  Clear and Coherent and Normal Rate  Volume:  Normal  Mood:   Anxious and Depressed  Affect:  Appropriate  Thought Process:  Coherent, Goal Directed, and Descriptions of Associations: Intact  Orientation:  Full (Time, Place, and Person)  Thought Content: Paranoid Ideation   Suicidal Thoughts:  No  Homicidal Thoughts:  No  Memory:  Immediate;   Good Recent;   Fair Remote;   Fair  Judgement:  Good  Insight:  Present  Psychomotor Activity:  Normal  Concentration:  Concentration: Good and Attention Span: Good  Recall:  Fair  Fund of Knowledge: Good  Language: Good  Akathisia:  No  Handed:  Right  AIMS (if indicated): not done  Assets:  Communication Skills Desire for Improvement Housing Social Support  ADL's:  Intact  Cognition: WNL  Sleep:  Poor   Screenings: AIMS    Flowsheet Row Video Visit from 12/19/2023 in [facility] Clinical Support from 08/09/2023 in [facility]  AIMS Total Score 2 1   GAD-7    Flowsheet Row Video Visit from 12/19/2023 in [facility] Clinical Support from 08/09/2023 in [facility] Counselor from 08/08/2023 in [facility] Clinical Support from 06/29/2023 in [facility] Clinical Support from 05/18/2023 in [facility]  Total GAD-7 Score 12 10 13 17 17    PHQ2-9    Flowsheet Row Video Visit from 12/19/2023 in [facility] Office Visit from 09/20/2023 in [facility] [place] Office Visit from 09/06/2023 in [facility] - [facility] [place] Clinical Support from 08/09/2023 in [facility] Counselor from 08/08/2023 in [facility]  PHQ-2 Total Score 2 2 2 2 1   PHQ-9 Total Score 10 12 8 14 12    Flowsheet Row Video Visit from 12/19/2023 in [facility] UC from 11/17/2023 in [facility] ED from 09/27/2023 in [facility] Emergency Department at [facility]  C-SSRS RISK CATEGORY No Risk No Risk No Risk     Assessment and Plan:   Anna Moses is a 48 year old female with a past psychiatric history significant for for schizoaffective disorder (depressive type), generalized anxiety disorder, and PTSD presents to [facility] via virtual video visit for follow-up and medication management.  Patient presents to the encounter stating that she has been taking her medication regularly and denies experiencing any adverse side effects at this time.  An aims assessment was performed with the patient scoring a 2.  Patient denies the need for medication management of her involuntary movements.  Patient states that her depression has been minimal as of late.  She also states that her anxiety is manageable at this time.  A PHQ-9 screen was performed with the patient scoring a 10.  A GAD-7 screen was also performed with the patient scoring a 12.  Patient also reports that her sleep has not been well and that she often only able to sleep 1 to 2 hours at a time.  Provider recommended adjusting patient's hydroxyzine  dosage from 25 mg to 50 mg at bedtime as needed for the management of her sleep.  Patient was agreeable to recommendation.  Patient would like to continue taking her other medications as prescribed.  Patient's medications to be e-prescribed to pharmacy of choice.  A [place] Suicide Severity Rating Scale was performed with the patient being considered no risk.  Patient denies suicidal ideations and is able to contract for safety at this time.  Collaboration of Care: Collaboration of Care: Medication Management AEB provider managing patient's psychiatric medications, Primary Care Provider AEB patient being seen by a family medicine provider, Psychiatrist  AEB patient being followed by a mental health provider at this facility, and Referral or follow-up with counselor/therapist AEB patient being seen by a licensed clinical social worker at this facility  Patient/Guardian was advised Release of Information must be obtained prior to any record release in order to collaborate their care with an outside provider. Patient/Guardian was advised if they have not already done so to contact the registration department to sign all necessary forms in order for us  to release information regarding their care.   Consent: Patient/Guardian gives verbal consent for treatment and assignment of benefits for services provided during this visit. Patient/Guardian expressed understanding and agreed to proceed.   1. Insomnia due to other mental disorder  - hydrOXYzine  (ATARAX ) 50 MG tablet; Take 1 tablet (50 mg total) by mouth at bedtime as needed.  Dispense: 30 tablet; Refill: 1  2. Schizoaffective disorder, depressive type (HCC)  - sertraline  (ZOLOFT ) 100 MG tablet; Take 1 tablet (100 mg total) by mouth daily.  Dispense: 30 tablet; Refill: 2 - ARIPiprazole  (ABILIFY ) 5 MG tablet; Take 1 tablet (5 mg total) by mouth daily.  Dispense: 30 tablet; Refill: 2  3. Generalized anxiety disorder  - sertraline  (ZOLOFT ) 100 MG tablet; Take 1 tablet (100 mg total) by mouth daily.  Dispense: 30 tablet; Refill: 2  4. PTSD (post-traumatic stress disorder)  - sertraline  (ZOLOFT ) 100 MG tablet; Take 1 tablet (100 mg total) by mouth daily.  Dispense:  30 tablet; Refill: 2  5. Long term current use of antipsychotic medication (Primary) Lab pending (hemoglobin A1c)  6. Marijuana use Provider encouraged marijuana cessation  Patient to follow up in 7 weeks Provider spent a total of 18 minutes with the patient/reviewing patient's chart  Reginia FORBES Bolster, PA 12/19/2023, 2:00 PM

## 2023-12-20 ENCOUNTER — Encounter (HOSPITAL_COMMUNITY): Payer: Self-pay | Admitting: Physician Assistant

## 2023-12-25 ENCOUNTER — Ambulatory Visit (INDEPENDENT_AMBULATORY_CARE_PROVIDER_SITE_OTHER): Payer: MEDICAID

## 2023-12-25 DIAGNOSIS — Z3042 Encounter for surveillance of injectable contraceptive: Secondary | ICD-10-CM | POA: Diagnosis not present

## 2023-12-25 LAB — POCT URINE PREGNANCY: Preg Test, Ur: NEGATIVE

## 2023-12-25 MED ORDER — MEDROXYPROGESTERONE ACETATE 150 MG/ML IM SUSP
150.0000 mg | Freq: Once | INTRAMUSCULAR | Status: AC
  Administered 2023-12-25: 150 mg via INTRAMUSCULAR

## 2023-12-25 NOTE — Progress Notes (Signed)
 Patient here today for Depo Provera  injection and is within her dates.    Last contraceptive appt was 06/07/2023.  Depo given in RD today, per patient preference. Site unremarkable & patient tolerated injection.    Next injection due 03/11/2024-03/25/2024.   Reminder card given.

## 2024-01-11 ENCOUNTER — Encounter: Payer: MEDICAID | Admitting: Neurology

## 2024-01-18 ENCOUNTER — Ambulatory Visit (INDEPENDENT_AMBULATORY_CARE_PROVIDER_SITE_OTHER): Payer: MEDICAID | Admitting: Neurology

## 2024-01-18 ENCOUNTER — Ambulatory Visit: Payer: Self-pay | Admitting: Neurology

## 2024-01-18 DIAGNOSIS — R202 Paresthesia of skin: Secondary | ICD-10-CM

## 2024-01-18 DIAGNOSIS — G5601 Carpal tunnel syndrome, right upper limb: Secondary | ICD-10-CM

## 2024-01-18 DIAGNOSIS — R200 Anesthesia of skin: Secondary | ICD-10-CM | POA: Diagnosis not present

## 2024-01-18 NOTE — Procedures (Signed)
  [facility]  [address] [place], [address]  [place], [place] [address] Tel: [phone] Fax: [phone] Test Date:  01/18/2024  Patient: Anna Moses DOB: Oct 10, 1975 Physician: Tonita Blanch, DO  Sex: Female Height: 5' 1 Ref Phys: Tonita Blanch, DO  ID#: [id number]   Technician:    History: This is a 48 year old female referred for evaluation of right arm and leg paresthesias.  NCV & EMG Findings: Electrodiagnostic testing is limited to nerve conduction study only, as patient did not want to proceed with needle electrode examination.  Findings show:  Right median sensory response shows prolonged latency (4.2 ms) and reduced amplitude (18.3 V).  Right ulnar, sural, and superficial peroneal sensory responses are within normal limits. Right median motor response shows prolonged latency (4.8 ms).  Right ulnar, peroneal, and tibial motor responses are within normal limits.  Right tibial H reflex study is within normal limits. Needle electrode examination was not performed at patient's request due to pain.   Impression: Electrodiagnostic testing was limited to nerve conduction study only, due to needle phobia.  Findings show: Right median neuropathy at or distal to the wrist, consistent with a clinical diagnosis of carpal tunnel syndrome.  Overall, these findings are moderate in degree electrically. There is no evidence of a large fiber sensorimotor polyneuropathy affecting the right side. A cervical/lumbosacral radiculopathy cannot be evaluated by this study.   ___________________________ Tonita Blanch, DO    Nerve Conduction Studies   Stim Site NR Peak (ms) Norm Peak (ms) O-P Amp (V) Norm O-P Amp  Right Median Anti Sensory (2nd Digit)  32 C  Wrist    *4.2 <3.4 *18.3 >20  Right Sup Peroneal Anti Sensory (Ant Lat Mall)  32 C  12 cm    2.2 <4.5 16.0 >5  Right Sural Anti Sensory (Lat Mall)  32 C  Calf    2.5 <4.5 24.5 >5  Right Ulnar Anti Sensory (5th Digit)  32 C   Wrist    2.9 <3.1 25.1 >12     Stim Site NR Onset (ms) Norm Onset (ms) O-P Amp (mV) Norm O-P Amp Site1 Site2 Delta-0 (ms) Dist (cm) Vel (m/s) Norm Vel (m/s)  Right Median Motor (Abd Poll Brev)  32 C  Wrist    *4.8 <3.9 8.0 >6 Elbow Wrist 4.4 27.0 61 >50  Elbow    9.2  7.7         Right Peroneal Motor (Ext Dig Brev)  32 C  Ankle    2.7 <5.5 10.2 >3 B Fib Ankle 6.7 37.0 55 >40  B Fib    9.4  9.8  Poplt B Fib 1.3 7.0 54 >40  Poplt    10.7  9.7         Right Tibial Motor (Abd Hall Brev)  32 C  Ankle    4.8 <6.0 8.5 >8 Knee Ankle 5.1 41.0 80 >40  Knee    9.9  5.2         Right Ulnar Motor (Abd Dig Minimi)  32 C  Wrist    2.4 <3.1 12.3 >7 B Elbow Wrist 3.1 23.0 74 >50  B Elbow    5.5  11.8  A Elbow B Elbow 1.5 10.0 67 >50  A Elbow    7.0  12.1             Waveforms:

## 2024-01-23 ENCOUNTER — Ambulatory Visit (HOSPITAL_COMMUNITY): Payer: MEDICAID | Admitting: Mental Health

## 2024-01-23 DIAGNOSIS — F411 Generalized anxiety disorder: Secondary | ICD-10-CM | POA: Diagnosis not present

## 2024-01-23 DIAGNOSIS — F251 Schizoaffective disorder, depressive type: Secondary | ICD-10-CM

## 2024-01-23 NOTE — Progress Notes (Unsigned)
   THERAPIST PROGRESS NOTE Virtual Visit via Video Note  I connected with Anna Moses on 01/23/24 at  4:00 PM EDT by a video enabled telemedicine application and verified that I am speaking with the correct person using two identifiers.  Location: Patient: home  address on file Provider: remote office   I discussed the limitations of evaluation and management by telemedicine and the availability of in person appointments. The patient expressed understanding and agreed to proceed.  I discussed the assessment and treatment plan with the patient. The patient was provided an opportunity to ask questions and all were answered. The patient agreed with the plan and demonstrated an understanding of the instructions.   The patient was advised to call back or seek an in-person evaluation if the symptoms worsen or if the condition fails to improve as anticipated.  I provided *** minutes of non-face-to-face time during this encounter.   Ty Bernice Savant, [facility]   Session Time: ***  Participation Level: {BHH PARTICIPATION OZCZO:77735}  Behavioral Response: {Appearance:22683}{BHH LEVEL OF CONSCIOUSNESS:22305}{BHH MOOD:22306}  Type of Therapy: {CHL AMB BH Type of Therapy:21022741}  Treatment Goals addressed: STG: Get better with crowds.Anna Moses will increase level of comfort in the community AEB development of coping skills for anxiety with ability to engage in community x 3 weekly for a period of 90 days.   ProgressTowards Goals: {Progress Towards Goals:21014066}  Interventions: {CHL AMB BH Type of Intervention:21022753}  Summary: Anna Moses is a 48 y.o. female who presents with dx of schizoaffective disorder, depressive type, generalized anxiety and cannabis use. Anna Moses presents for therapy session alert and oriented; mood and affect stable; euthymic. Shares to be doing well and denies concerns for depression or psychotic sxs. Shares ongoing stability and daily medication     Suicidal/Homicidal: {BHH YES OR NO:22294}{yes/no/with/without intent/plan:22693}  Therapist Response: Therapist engaged Anna Moses in therapy session. Completed check in and assessed fr current level of stressors, sxs management and level of functioning. Provided safe space for Anna Moses to shares thoughts and feelings on current events and provided supportive feedback. Explored level of self-care and engagement community and level of comfort.   Plan: Return again in *** weeks.  Diagnosis: No diagnosis found.  Collaboration of Care: {BH OP Collaboration of Care:21014065}  Patient/Guardian was advised Release of Information must be obtained prior to any record release in order to collaborate their care with an outside provider. Patient/Guardian was advised if they have not already done so to contact the registration department to sign all necessary forms in order for us  to release information regarding their care.   Consent: Patient/Guardian gives verbal consent for treatment and assignment of benefits for services provided during this visit. Patient/Guardian expressed understanding and agreed to proceed.   Ty Bernice Reliance, [facility] 01/23/2024

## 2024-01-24 ENCOUNTER — Encounter: Payer: Self-pay | Admitting: Podiatry

## 2024-01-24 ENCOUNTER — Ambulatory Visit (INDEPENDENT_AMBULATORY_CARE_PROVIDER_SITE_OTHER): Payer: MEDICAID | Admitting: Podiatry

## 2024-01-24 DIAGNOSIS — B353 Tinea pedis: Secondary | ICD-10-CM

## 2024-01-24 MED ORDER — KETOCONAZOLE 2 % EX CREA: 1.0000 | TOPICAL_CREAM | Freq: Every day | CUTANEOUS | 2 refills | Status: AC

## 2024-01-24 NOTE — Progress Notes (Signed)
  Subjective:  Patient ID: Anna Moses, female    DOB: 1976-02-13,   MRN: [id number]  Chief Complaint  Patient presents with   Tinea Pedis    I have Athlete's Feet.  I can't get rid of it.   Foot Pain    This bone hurts. (Bunion right)     48 y.o. female presents for concern of athletes foot that she has had for a while. Relates she has had trouble gettigng rid of it. States she h as tried. Also has some pain around her right great toe that is tender . Denies any other pedal complaints. Denies n/v/f/c.   Past Medical History:  Diagnosis Date   Asthma    Bronchitis    Depression    Obesity    Schizophrenia (HCC)     Objective:  Physical Exam: Vascular: DP/PT pulses 2/4 bilateral. CFT <3 seconds. Normal hair growth on digits. No edema.  Skin. No lacerations or abrasions bilateral feet. Scaling and erythema noted to interspaceds 1-4 bilateral and plantar foot. Mild incurvaiton noted to medial border of right hallux. Tender to palpation.  Musculoskeletal: MMT 5/5 bilateral lower extremities in DF, PF, Inversion and Eversion. Deceased ROM in DF of ankle joint.  Neurological: Sensation intact to light touch.   Assessment:   1. Tinea pedis of both feet      Plan:  Patient was evaluated and treated and all questions answered. -Examined patient -Discussed treatment options fortinea pedis.  -Discussed fungaltreatment options including oral, topical. Ketoconazole sent to the pharmacy.  Breifly discuss ingrown nails as well and treamtents options. Defers today.  -Patient to return in 4 weeks for follow up evaluation   Asberry Failing, DPM

## 2024-01-24 NOTE — Progress Notes (Signed)
 Called patient and gave instructions on wrist brace patient will send update in 2 months

## 2024-02-07 ENCOUNTER — Telehealth (HOSPITAL_COMMUNITY): Payer: MEDICAID | Admitting: Physician Assistant

## 2024-02-07 ENCOUNTER — Encounter (HOSPITAL_COMMUNITY): Payer: Self-pay | Admitting: Physician Assistant

## 2024-02-07 DIAGNOSIS — F411 Generalized anxiety disorder: Secondary | ICD-10-CM

## 2024-02-07 DIAGNOSIS — F99 Mental disorder, not otherwise specified: Secondary | ICD-10-CM

## 2024-02-07 DIAGNOSIS — F5105 Insomnia due to other mental disorder: Secondary | ICD-10-CM | POA: Diagnosis not present

## 2024-02-07 DIAGNOSIS — F1290 Cannabis use, unspecified, uncomplicated: Secondary | ICD-10-CM

## 2024-02-07 DIAGNOSIS — F251 Schizoaffective disorder, depressive type: Secondary | ICD-10-CM

## 2024-02-07 DIAGNOSIS — Z79899 Other long term (current) drug therapy: Secondary | ICD-10-CM

## 2024-02-07 DIAGNOSIS — F4310 Post-traumatic stress disorder, unspecified: Secondary | ICD-10-CM | POA: Diagnosis not present

## 2024-02-07 MED ORDER — SERTRALINE HCL 100 MG PO TABS: 100.0000 mg | ORAL_TABLET | Freq: Every day | ORAL | 2 refills | Status: DC

## 2024-02-07 MED ORDER — ARIPIPRAZOLE 5 MG PO TABS: 5.0000 mg | ORAL_TABLET | Freq: Every day | ORAL | 2 refills | Status: DC

## 2024-02-07 MED ORDER — UNISOM SLEEPTABS 25 MG PO TABS: 25.0000 mg | ORAL_TABLET | Freq: Every evening | ORAL | 1 refills | Status: DC | PRN

## 2024-02-07 NOTE — Progress Notes (Signed)
 [facility] MD/PA/NP OP Progress Note  Virtual Visit via Video Note  I connected with Anna Moses on 02/07/24 at 11:00 AM EDT by a video enabled telemedicine application and verified that I am speaking with the correct person using two identifiers.  Location: Patient: Home Provider: Clinic   I discussed the limitations of evaluation and management by telemedicine and the availability of in person appointments. The patient expressed understanding and agreed to proceed.  Follow Up Instructions:  I discussed the assessment and treatment plan with the patient. The patient was provided an opportunity to ask questions and all were answered. The patient agreed with the plan and demonstrated an understanding of the instructions.   The patient was advised to call back or seek an in-person evaluation if the symptoms worsen or if the condition fails to improve as anticipated.  I provided 17 minutes of non-face-to-face time during this encounter.  Reginia FORBES Bolster, PA    02/07/2024 11:00 AM Anna Moses  MRN:  [id number]  Chief Complaint:  Chief Complaint  Patient presents with   Follow-up   Medication Management   HPI:   Anna Moses is a 48 year old female with a past psychiatric history significant for for schizoaffective disorder (depressive type), generalized anxiety disorder, and PTSD presents to [facility] via virtual video visit for follow-up and medication management.  Patient is currently being managed on the following psychiatric medications:  Abilify  5 mg daily Sertraline  100 mg daily Hydroxyzine  50 mg at bedtime as needed  Patient presents to the encounter stating that she continues to take her medications regularly and denies experiencing any adverse side effects.  She reports that she is tolerating her medications well but she still continues to experience issues with sleep.  Patient denies overt depressive symptoms nor does  she endorse anxiety.  Patient denies changes in her behavior or paranoia.  Patient further denies auditory or visual hallucinations.  Patient denies any other stressors at this time.  A PHQ-9 screen was performed with the patient scoring an 18.  A GAD-7 screen was also performed with the patient scoring a 15.  Patient is alert and oriented x 4, calm, cooperative, and fully engaged in conversation during the encounter.  Patient endorses good mood.  Patient exhibits depressed mood with appropriate affect.  Patient denies suicidal or homicidal ideations.  She further denies auditory or visual hallucinations and does not appear to be responding to internal/external stimuli.  Patient endorses poor sleep and receives on average 3 hours of sleep per night.  She reports that she typically wakes up 8 hours a night.  Patient endorses fair appetite and eats on average 1-2 meals per day.  Patient denies alcohol consumption.  Patient endorses tobacco use and smokes on average a pack every 2 days.  Patient endorses illicit drug use in the form of marijuana.  Visit Diagnosis:    ICD-10-CM   1. Insomnia due to other mental disorder  F51.05 doxylamine , Sleep, (UNISOM  SLEEPTABS) 25 MG tablet   F99     2. Schizoaffective disorder, depressive type (HCC)  F25.1 ARIPiprazole  (ABILIFY ) 5 MG tablet    sertraline  (ZOLOFT ) 100 MG tablet    3. Generalized anxiety disorder  F41.1 sertraline  (ZOLOFT ) 100 MG tablet    4. PTSD (post-traumatic stress disorder)  F43.10 sertraline  (ZOLOFT ) 100 MG tablet      Past Psychiatric History:  Patient endorses a past psychiatric history significant for schizophrenia   Patient endorses a past history  of hospitalization due to mental health.  Patient reports that she was last hospitalized a few years ago.   Patient endorses a past history of suicide attempt stating that she last attempted several years ago.   Patient denies a past history of homicide attempt  Past Medical History:   Past Medical History:  Diagnosis Date   Asthma    Bronchitis    Depression    Obesity    Schizophrenia (HCC)     Past Surgical History:  Procedure Laterality Date   BREAST BIOPSY Left 09/13/2021   BREAST DUCTAL SYSTEM EXCISION Left 11/17/2022   Procedure: LEFT BREAST CENTRAL DUCT EXCISION;  Surgeon: Vanderbilt Ned, MD;  Location: [facility];  Service: General;  Laterality: Left;   CESAREAN SECTION     CHONDROPLASTY Left 06/02/2022   Procedure: CHONDROPLASTY;  Surgeon: Cristy Bonner DASEN, MD;  Location: [facility];  Service: Orthopedics;  Laterality: Left;   COLONOSCOPY  06/27/2019   KNEE ARTHROSCOPY WITH LATERAL RELEASE Left 06/02/2022   Procedure: KNEE ARTHROSCOPY WITH LATERAL RELEASE;  Surgeon: Cristy Bonner DASEN, MD;  Location: [facility];  Service: Orthopedics;  Laterality: Left;   KNEE ARTHROSCOPY WITH MEDIAL MENISECTOMY Left 06/02/2022   Procedure: KNEE ARTHROSCOPY WITH MEDIAL MENISECTOMY;  Surgeon: Cristy Bonner DASEN, MD;  Location: [facility];  Service: Orthopedics;  Laterality: Left;   UPPER GASTROINTESTINAL ENDOSCOPY  06/27/2019    Family Psychiatric History:  Mother - unsure of diagnosis Sister - unsure of diagnosis Brother - unsure of diagnosis   Family history of suicide attempt: Patient denies Family history of homicide attempt: Patient denies Family history of substance abuse: Patient reports that her mother, father, brother, and sister abused substances  Family History:  Family History  Problem Relation Age of Onset   Crohn's disease Mother    Liver disease Father    Clotting disorder Father    Liver cancer Father    Liver disease Brother        Liver transplant   Colon cancer Neg Hx    Esophageal cancer Neg Hx    Rectal cancer Neg Hx    Stomach cancer Neg Hx    Breast cancer Neg Hx     Social History:  Social History   Socioeconomic History   Marital status: Single    Spouse name: Not on file    Number of children: 4   Years of education: Not on file   Highest education level: Not on file  Occupational History   Occupation: stay at home mother  Tobacco Use   Smoking status: Some Days    Current packs/day: 0.50    Average packs/day: 0.5 packs/day for 30.8 years (15.4 ttl pk-yrs)    Types: Cigarettes    Start date: 1995    Passive exposure: Current   Smokeless tobacco: Never  Vaping Use   Vaping status: Never Used  Substance and Sexual Activity   Alcohol use: Not Currently   Drug use: Yes    Types: Marijuana    Comment: occasional   Sexual activity: Not Currently    Birth control/protection: None  Other Topics Concern   Not on file  Social History Narrative   Are you right handed or left handed? Right handed   Are you currently employed ? No    What is your current occupation? No    Do you live at home alone? No    Who lives with you? Family    What  type of home do you live in: 1 story or 2 story? Lives in a two story home       Social Drivers of Health   Financial Resource Strain: Medium Risk (10/25/2022)   Overall Financial Resource Strain (CARDIA)    Difficulty of Paying Living Expenses: Somewhat hard  Food Insecurity: No Food Insecurity (10/25/2022)   Hunger Vital Sign    Worried About Running Out of Food in the Last Year: Never true    Ran Out of Food in the Last Year: Never true  Transportation Needs: Unmet Transportation Needs (10/25/2022)   PRAPARE - Transportation    Lack of Transportation (Medical): Yes    Lack of Transportation (Non-Medical): Yes  Physical Activity: Inactive (10/25/2022)   Exercise Vital Sign    Days of Exercise per Week: 0 days    Minutes of Exercise per Session: 0 min  Stress: Stress Concern Present (10/25/2022)   [employer] of Occupational Health - Occupational Stress Questionnaire    Feeling of Stress : Very much  Social Connections: Socially Isolated (10/25/2022)   Social Connection and Isolation Panel    Frequency of  Communication with Friends and Family: More than three times a week    Frequency of Social Gatherings with Friends and Family: Never    Attends Religious Services: Never    Database Administrator or Organizations: No    Attends Engineer, Structural: Never    Marital Status: Never married    Allergies: No Known Allergies  Metabolic Disorder Labs: Lab Results  Component Value Date   HGBA1C 5.4 08/20/2019   No results found for: PROLACTIN Lab Results  Component Value Date   CHOL 189 09/20/2023   TRIG 87 09/20/2023   HDL 40 09/20/2023   CHOLHDL 4.7 (H) 09/20/2023   LDLCALC 133 (H) 09/20/2023   Lab Results  Component Value Date   TSH 1.070 09/20/2023    Therapeutic Level Labs: No results found for: LITHIUM No results found for: VALPROATE No results found for: CBMZ  Current Medications: Current Outpatient Medications  Medication Sig Dispense Refill   doxylamine , Sleep, (UNISOM  SLEEPTABS) 25 MG tablet Take 1 tablet (25 mg total) by mouth at bedtime as needed. 30 tablet 1   albuterol  (VENTOLIN  HFA) 108 (90 Base) MCG/ACT inhaler Inhale 1-2 puffs into the lungs every 6 (six) hours as needed for wheezing or shortness of breath. 8 g 0   amLODipine  (NORVASC ) 5 MG tablet Take 1 tablet (5 mg total) by mouth at bedtime. (Patient not taking: Reported on 01/24/2024) 90 tablet 3   ARIPiprazole  (ABILIFY ) 5 MG tablet Take 1 tablet (5 mg total) by mouth daily. 30 tablet 2   Capsaicin  0.1 % CREA Apply as needed to the abdomen for pain relief (Patient not taking: Reported on 11/21/2023) 60 g 0   clotrimazole -betamethasone  (LOTRISONE ) cream Apply to affected area on back 2 times daily until improved (Patient not taking: Reported on 01/24/2024) 15 g 0   doxycycline  (VIBRAMYCIN ) 100 MG capsule Take 1 capsule (100 mg total) by mouth 2 (two) times daily. (Patient not taking: Reported on 01/24/2024) 20 capsule 0   estradiol cypionate (DEPO-ESTRADIOL) 5 MG/ML injection Inject into the  muscle every 28 (twenty-eight) days.     fluticasone  (FLONASE ) 50 MCG/ACT nasal spray Place 1 spray into both nostrils daily. (Patient not taking: Reported on 01/24/2024) 15.8 mL 0   hydrOXYzine  (ATARAX ) 50 MG tablet Take 1 tablet (50 mg total) by mouth at bedtime as needed. 30 tablet  1   ketoconazole  (NIZORAL ) 2 % cream Apply 1 Application topically daily. 60 g 2   mupirocin  ointment (BACTROBAN ) 2 % Apply 1 Application topically 2 (two) times daily. To affected area that is infected till better (Patient not taking: Reported on 01/24/2024) 22 g 0   ondansetron  (ZOFRAN -ODT) 4 MG disintegrating tablet Take 1 tablet (4 mg total) by mouth every 8 (eight) hours as needed for nausea or vomiting. (Patient not taking: Reported on 01/24/2024) 20 tablet 0   oxyCODONE  (ROXICODONE ) 5 MG immediate release tablet Take 1 tablet (5 mg total) by mouth every 6 (six) hours as needed for severe pain. (Patient not taking: Reported on 01/24/2024) 12 tablet 0   pantoprazole  (PROTONIX ) 20 MG tablet Take 1 tablet (20 mg total) by mouth daily. 30 tablet 0   promethazine  (PHENERGAN ) 25 MG suppository Place 1 suppository (25 mg total) rectally every 6 (six) hours as needed for nausea or vomiting. (Patient not taking: Reported on 11/21/2023) 120 each 0   promethazine -dextromethorphan (PROMETHAZINE -DM) 6.25-15 MG/5ML syrup Take 5 mLs by mouth 4 (four) times daily as needed for cough. (Patient not taking: Reported on 01/24/2024) 118 mL 0   sertraline  (ZOLOFT ) 100 MG tablet Take 1 tablet (100 mg total) by mouth daily. 30 tablet 2   No current facility-administered medications for this visit.     Musculoskeletal: Strength & Muscle Tone: within normal limits Gait & Station: normal Patient leans: N/A  Psychiatric Specialty Exam: Review of Systems  Psychiatric/Behavioral:  Positive for dysphoric mood and sleep disturbance. Negative for decreased concentration, hallucinations, self-injury and suicidal ideas. The patient is  nervous/anxious. The patient is not hyperactive.     There were no vitals taken for this visit.There is no height or weight on file to calculate BMI.  General Appearance: Casual  Eye Contact:  Good  Speech:  Clear and Coherent and Normal Rate  Volume:  Normal  Mood:  Anxious and Depressed  Affect:  Appropriate  Thought Process:  Coherent, Goal Directed, and Descriptions of Associations: Intact  Orientation:  Full (Time, Place, and Person)  Thought Content: Paranoid Ideation   Suicidal Thoughts:  No  Homicidal Thoughts:  No  Memory:  Immediate;   Good Recent;   Fair Remote;   Fair  Judgement:  Good  Insight:  Present  Psychomotor Activity:  Normal  Concentration:  Concentration: Good and Attention Span: Good  Recall:  Fair  Fund of Knowledge: Good  Language: Good  Akathisia:  No  Handed:  Right  AIMS (if indicated): not done  Assets:  Communication Skills Desire for Improvement Housing Social Support  ADL's:  Intact  Cognition: WNL  Sleep:  Poor   Screenings: AIMS    Flowsheet Row Video Visit from 02/07/2024 in [facility] Video Visit from 12/19/2023 in [facility] Clinical Support from 08/09/2023 in [facility]  AIMS Total Score 2 2 1    GAD-7    Flowsheet Row Video Visit from 02/07/2024 in [facility] Video Visit from 12/19/2023 in [facility] Clinical Support from 08/09/2023 in [facility] Counselor from 08/08/2023 in [facility] Clinical Support from 06/29/2023 in [facility]  Total GAD-7 Score 15 12 10 13 17    PHQ2-9    Flowsheet Row Video Visit from 02/07/2024 in [facility] Video Visit from 12/19/2023 in [facility] Office Visit from  09/20/2023 in [facility]  [place] Office Visit from 09/06/2023 in [facility]. [facility] Clinical Support from 08/09/2023 in [facility]  PHQ-2 Total Score 4 2 2 2 2   PHQ-9 Total Score 18 10 12 8 14    Flowsheet Row Video Visit from 02/07/2024 in [facility] Video Visit from 12/19/2023 in [facility] UC from 11/17/2023 in [facility])  C-SSRS RISK CATEGORY Moderate Risk No Risk No Risk     Assessment and Plan:   Anna Moses is a 48 year old female with a past psychiatric history significant for for schizoaffective disorder (depressive type), generalized anxiety disorder, and PTSD presents to [facility] via virtual video visit for follow-up and medication management.  Patient presents to the encounter stating that she continues to take her medication regularly and denies experiencing any adverse side effects at this time.  Patient continues to endorse issues with sleep stating that she receives a cumulative total of 3 hours of sleep per night.  Hydroxyzine  does not appear to be effective in managing patient's sleep.  Provider informed patient to discontinue her use of hydroxyzine .  Patient was recommended Unisom  25 mg at bedtime for the management of her sleep.  Patient was agreeable to recommendation.  In regards to her mood, patient denies overt depressive symptoms nor does she endorse anxiety.  Though patient denies depression or anxiety, a PHQ-9 screen was performed with the patient scoring an 18.  A GAD-7 screen was also performed with the patient scoring a 15.  Patient denies any changes in her behavior nor does she endorse paranoia.  Patient does not appear to be responding to internal/external stimuli.  Despite her screening assessment scores, patient endorses stable mood and  would like to continue taking her Abilify  and sertraline  as prescribed.  Patient's medications to be e-prescribed to pharmacy of choice.  A [place] Suicide Severity Rating Scale was performed with the patient being considered no risk.  Patient denies suicidal ideations and is able to contract for safety at this time.  Collaboration of Care: Collaboration of Care: Medication Management AEB provider managing patient's psychiatric medications, Primary Care Provider AEB patient being seen by a family medicine provider, Psychiatrist AEB patient being followed by a mental health provider at this facility, and Referral or follow-up with counselor/therapist AEB patient being seen by a licensed clinical social worker at this facility  Patient/Guardian was advised Release of Information must be obtained prior to any record release in order to collaborate their care with an outside provider. Patient/Guardian was advised if they have not already done so to contact the registration department to sign all necessary forms in order for us  to release information regarding their care.   Consent: Patient/Guardian gives verbal consent for treatment and assignment of benefits for services provided during this visit. Patient/Guardian expressed understanding and agreed to proceed.   1. Insomnia due to other mental disorder (Primary)  - doxylamine , Sleep, (UNISOM  SLEEPTABS) 25 MG tablet; Take 1 tablet (25 mg total) by mouth at bedtime as needed.  Dispense: 30 tablet; Refill: 1  2. Schizoaffective disorder, depressive type (HCC)  - ARIPiprazole  (ABILIFY ) 5 MG tablet; Take 1 tablet (5 mg total) by mouth daily.  Dispense: 30 tablet; Refill: 2 - sertraline  (ZOLOFT ) 100 MG tablet; Take 1 tablet (  100 mg total) by mouth daily.  Dispense: 30 tablet; Refill: 2  3. Generalized anxiety disorder  - sertraline  (ZOLOFT ) 100 MG tablet; Take 1 tablet (100 mg total) by mouth daily.  Dispense: 30 tablet; Refill: 2  4. PTSD  (post-traumatic stress disorder)  - sertraline  (ZOLOFT ) 100 MG tablet; Take 1 tablet (100 mg total) by mouth daily.  Dispense: 30 tablet; Refill: 2  5. Long term current use of antipsychotic medication Lab pending (hemoglobin A1c)  6. Marijuana use Provider encouraged marijuana cessation  Patient to follow up in 2 months Provider spent a total of 17 minutes with the patient/reviewing patient's chart  Reginia FORBES Bolster, PA 02/07/2024, 11:00 AM

## 2024-02-21 ENCOUNTER — Ambulatory Visit (INDEPENDENT_AMBULATORY_CARE_PROVIDER_SITE_OTHER): Payer: MEDICAID | Admitting: Podiatry

## 2024-02-21 ENCOUNTER — Encounter: Payer: Self-pay | Admitting: Podiatry

## 2024-02-21 DIAGNOSIS — B353 Tinea pedis: Secondary | ICD-10-CM | POA: Diagnosis not present

## 2024-02-21 NOTE — Progress Notes (Signed)
  Subjective:  Patient ID: Anna Moses, female    DOB: December 02, 1975,   MRN: [id number]  Chief Complaint  Patient presents with   Tinea Pedis    They're doing much better.    48 y.o. female presents for follow-up of tinea pedis.  Relates doing much better.  It has cleared up.  She has been using ketoconazole and is nearly out of it.. Denies any other pedal complaints. Denies n/v/f/c.   Past Medical History:  Diagnosis Date   Asthma    Bronchitis    Depression    Obesity    Schizophrenia (HCC)     Objective:  Physical Exam: Vascular: DP/PT pulses 2/4 bilateral. CFT <3 seconds. Normal hair growth on digits. No edema.  Skin. No lacerations or abrasions bilateral feet. Scaling and erythema noted to interspaceds 1-4 bilateral and plantar foot has cleared. Mild incurvaiton noted to medial border of right hallux. Tender to palpation.  Musculoskeletal: MMT 5/5 bilateral lower extremities in DF, PF, Inversion and Eversion. Deceased ROM in DF of ankle joint.  Neurological: Sensation intact to light touch.   Assessment:   1. Tinea pedis of both feet       Plan:  Patient was evaluated and treated and all questions answered. -Examined patient -Discussed treatment options fortinea pedis.  -Discussed fungaltreatment options including oral, topical. Finish out ketoconazole treatment Breifly discuss ingrown nails as well and treamtents options. Defers today.  -Patient to return in the future for possible ingrown nail procedure.   Asberry Failing, DPM

## 2024-02-28 ENCOUNTER — Ambulatory Visit: Payer: MEDICAID | Admitting: Podiatry

## 2024-02-28 DIAGNOSIS — Z91199 Patient's noncompliance with other medical treatment and regimen due to unspecified reason: Secondary | ICD-10-CM

## 2024-02-28 NOTE — Progress Notes (Signed)
 No show

## 2024-03-13 ENCOUNTER — Ambulatory Visit (HOSPITAL_COMMUNITY): Payer: MEDICAID | Admitting: Mental Health

## 2024-03-22 ENCOUNTER — Ambulatory Visit

## 2024-03-22 DIAGNOSIS — Z3042 Encounter for surveillance of injectable contraceptive: Secondary | ICD-10-CM

## 2024-03-22 MED ORDER — MEDROXYPROGESTERONE ACETATE 150 MG/ML IM SUSP
150.0000 mg | Freq: Once | INTRAMUSCULAR | Status: AC
  Administered 2024-03-22: 150 mg via INTRAMUSCULAR

## 2024-03-22 NOTE — Progress Notes (Signed)
 Patient here today for Depo Provera  injection and is within her dates.    Last contraceptive appt was 06/07/23.  Depo given in LD today, per patient preference.  Site unremarkable & patient tolerated injection.    Next injection due 06/07/24-06/21/24. Advised patient that she would be due for annual contraceptive visit prior to additional dep injections. Reminder card given.    Chiquita JAYSON English, RN

## 2024-04-09 ENCOUNTER — Encounter (HOSPITAL_COMMUNITY): Payer: Self-pay | Admitting: Physician Assistant

## 2024-04-09 ENCOUNTER — Telehealth (HOSPITAL_COMMUNITY): Payer: MEDICAID | Admitting: Physician Assistant

## 2024-04-09 DIAGNOSIS — G2401 Drug induced subacute dyskinesia: Secondary | ICD-10-CM

## 2024-04-09 DIAGNOSIS — F251 Schizoaffective disorder, depressive type: Secondary | ICD-10-CM

## 2024-04-09 DIAGNOSIS — T438X5A Adverse effect of other psychotropic drugs, initial encounter: Secondary | ICD-10-CM | POA: Diagnosis not present

## 2024-04-09 DIAGNOSIS — F5105 Insomnia due to other mental disorder: Secondary | ICD-10-CM | POA: Diagnosis not present

## 2024-04-09 DIAGNOSIS — F4310 Post-traumatic stress disorder, unspecified: Secondary | ICD-10-CM | POA: Diagnosis not present

## 2024-04-09 DIAGNOSIS — F411 Generalized anxiety disorder: Secondary | ICD-10-CM

## 2024-04-09 DIAGNOSIS — Z79899 Other long term (current) drug therapy: Secondary | ICD-10-CM | POA: Diagnosis not present

## 2024-04-09 DIAGNOSIS — F1290 Cannabis use, unspecified, uncomplicated: Secondary | ICD-10-CM

## 2024-04-09 DIAGNOSIS — F99 Mental disorder, not otherwise specified: Secondary | ICD-10-CM | POA: Diagnosis not present

## 2024-04-09 MED ORDER — VALBENAZINE TOSYLATE 40 MG PO CAPS: 40.0000 mg | ORAL_CAPSULE | Freq: Every day | ORAL | 0 refills | Status: AC

## 2024-04-09 MED ORDER — UNISOM SLEEPTABS 25 MG PO TABS: 25.0000 mg | ORAL_TABLET | Freq: Every evening | ORAL | 1 refills | Status: AC | PRN

## 2024-04-09 MED ORDER — ARIPIPRAZOLE 5 MG PO TABS: 5.0000 mg | ORAL_TABLET | Freq: Every day | ORAL | 2 refills | Status: AC

## 2024-04-09 MED ORDER — SERTRALINE HCL 100 MG PO TABS: 100.0000 mg | ORAL_TABLET | Freq: Every day | ORAL | 2 refills | Status: AC

## 2024-04-09 NOTE — Progress Notes (Signed)
 BH MD/PA/NP OP Progress Note  Virtual Visit via Video Note  I connected with Anna Moses on 04/09/24 at 11:30 AM EST by a video enabled telemedicine application and verified that I am speaking with the correct person using two identifiers.  Location: Patient: Home Provider: Clinic   I discussed the limitations of evaluation and management by telemedicine and the availability of in person appointments. The patient expressed understanding and agreed to proceed.  Follow Up Instructions:  I discussed the assessment and treatment plan with the patient. The patient was provided an opportunity to ask questions and all were answered. The patient agreed with the plan and demonstrated an understanding of the instructions.   The patient was advised to call back or seek an in-person evaluation if the symptoms worsen or if the condition fails to improve as anticipated.  I provided 19 minutes of non-face-to-face time during this encounter.  Reginia FORBES Bolster, PA    04/09/2024 11:30 AM Anna Moses  MRN:  [id number]  Chief Complaint:  Chief Complaint  Patient presents with   Follow-up   Medication Management   HPI:   Anna Moses is a 48 year old female with a past psychiatric history significant for for schizoaffective disorder (depressive type), generalized anxiety disorder, and PTSD presents to [facility] via virtual video visit for follow-up and medication management.  Patient is currently being managed on the following psychiatric medications:  Abilify  5 mg daily Sertraline  100 mg daily Unisom  25 mg at bedtime  Patient reports that she was unable to receive Unisom  due to a discrepancy with her Medicaid.  Patient reports that she needs to enroll for Medicaid.  Patient reports that her sleep has still been the same since the last encounter.  She reports that she has been taking p.m. sleep meds but she is unable to stay asleep.   Patient receives roughly 3 hours of sleep per night.  In regards to her mood, patient reports that it has been fine.  She reports that she continues to take her psychiatric medications regularly.  She reports that she has been experiencing shaking in her legs and hands.  She reports that her shaking has been going on since the last encounter.  Patient denies overt depressive symptoms.  She does continue to endorse anxiety and rates her anxiety a 5 out of 10.  Triggers to her anxiety include going out with her kids and going to places by herself.  Patient denies any changes in her behavior and states that she is not lashing out as much as she used to.  She reports being more calm and has made an effort to trying to find some peace.  Patient denies paranoia.  Patient further denies auditory or visual hallucinations.  Patient reports no other issues or concerns at this time.  A PHQ 2 screen was performed with the patient scoring a 1.  A GAD-7 screen was also performed with the patient scoring an 11.  Patient is alert and oriented x 4, calm, cooperative, and fully engaged in conversation during the encounter.  Patient endorses good mood.  Patient exhibits euthymic mood with appropriate affect.  Patient denies suicidal ideations.  She endorses homicidal ideations but denies having a specific plan.  Patient denies auditory or visual hallucinations and does not appear to be responding to internal/external stimuli.  Patient endorses poor sleep and receives on average 3 hours of sleep per night.  Patient endorses fair appetite and eats on average  1-2 meals per day.  Patient denies alcohol consumption.  Patient endorses tobacco use and smokes on average a pack every 2 days.  Patient endorses illicit drug use in the form of marijuana.  Visit Diagnosis:    ICD-10-CM   1. Tardive dyskinesia  G24.01 valbenazine  (INGREZZA ) 40 MG capsule    2. Insomnia due to other mental disorder  F51.05 doxylamine , Sleep, (UNISOM   SLEEPTABS) 25 MG tablet   F99     3. Schizoaffective disorder, depressive type (HCC)  F25.1 ARIPiprazole  (ABILIFY ) 5 MG tablet    sertraline  (ZOLOFT ) 100 MG tablet    4. Generalized anxiety disorder  F41.1 sertraline  (ZOLOFT ) 100 MG tablet    5. PTSD (post-traumatic stress disorder)  F43.10 sertraline  (ZOLOFT ) 100 MG tablet      Past Psychiatric History:  Patient endorses a past psychiatric history significant for schizophrenia   Patient endorses a past history of hospitalization due to mental health.  Patient reports that she was last hospitalized a few years ago.   Patient endorses a past history of suicide attempt stating that she last attempted several years ago.   Patient denies a past history of homicide attempt  Past Medical History:  Past Medical History:  Diagnosis Date   Asthma    Bronchitis    Depression    Obesity    Schizophrenia (HCC)     Past Surgical History:  Procedure Laterality Date   BREAST BIOPSY Left 09/13/2021   BREAST DUCTAL SYSTEM EXCISION Left 11/17/2022   Procedure: LEFT BREAST CENTRAL DUCT EXCISION;  Surgeon: Vanderbilt Ned, MD;  Location: [facility];  Service: General;  Laterality: Left;   CESAREAN SECTION     CHONDROPLASTY Left 06/02/2022   Procedure: CHONDROPLASTY;  Surgeon: Cristy Bonner DASEN, MD;  Location: [facility];  Service: Orthopedics;  Laterality: Left;   COLONOSCOPY  06/27/2019   KNEE ARTHROSCOPY WITH LATERAL RELEASE Left 06/02/2022   Procedure: KNEE ARTHROSCOPY WITH LATERAL RELEASE;  Surgeon: Cristy Bonner DASEN, MD;  Location: [facility];  Service: Orthopedics;  Laterality: Left;   KNEE ARTHROSCOPY WITH MEDIAL MENISECTOMY Left 06/02/2022   Procedure: KNEE ARTHROSCOPY WITH MEDIAL MENISECTOMY;  Surgeon: Cristy Bonner DASEN, MD;  Location: [facility];  Service: Orthopedics;  Laterality: Left;   UPPER GASTROINTESTINAL ENDOSCOPY  06/27/2019    Family Psychiatric History:  Mother - unsure of  diagnosis Sister - unsure of diagnosis Brother - unsure of diagnosis   Family history of suicide attempt: Patient denies Family history of homicide attempt: Patient denies Family history of substance abuse: Patient reports that her mother, father, brother, and sister abused substances  Family History:  Family History  Problem Relation Age of Onset   Crohn's disease Mother    Liver disease Father    Clotting disorder Father    Liver cancer Father    Liver disease Brother        Liver transplant   Colon cancer Neg Hx    Esophageal cancer Neg Hx    Rectal cancer Neg Hx    Stomach cancer Neg Hx    Breast cancer Neg Hx     Social History:  Social History   Socioeconomic History   Marital status: Single    Spouse name: Not on file   Number of children: 4   Years of education: Not on file   Highest education level: Not on file  Occupational History   Occupation: stay at home mother  Tobacco Use   Smoking  status: Some Days    Current packs/day: 0.50    Average packs/day: 0.5 packs/day for 31.0 years (15.5 ttl pk-yrs)    Types: Cigarettes    Start date: 1995    Passive exposure: Current   Smokeless tobacco: Never  Vaping Use   Vaping status: Never Used  Substance and Sexual Activity   Alcohol use: Not Currently   Drug use: Yes    Types: Marijuana    Comment: occasional   Sexual activity: Not Currently    Birth control/protection: None  Other Topics Concern   Not on file  Social History Narrative   Are you right handed or left handed? Right handed   Are you currently employed ? No    What is your current occupation? No    Do you live at home alone? No    Who lives with you? Family    What type of home do you live in: 1 story or 2 story? Lives in a two story home       Social Drivers of Health   Tobacco Use: High Risk (04/09/2024)   Patient History    Smoking Tobacco Use: Some Days    Smokeless Tobacco Use: Never    Passive Exposure: Current  Financial  Resource Strain: Medium Risk (10/25/2022)   Overall Financial Resource Strain (CARDIA)    Difficulty of Paying Living Expenses: Somewhat hard  Food Insecurity: No Food Insecurity (10/25/2022)   Hunger Vital Sign    Worried About Running Out of Food in the Last Year: Never true    Ran Out of Food in the Last Year: Never true  Transportation Needs: Unmet Transportation Needs (10/25/2022)   PRAPARE - Transportation    Lack of Transportation (Medical): Yes    Lack of Transportation (Non-Medical): Yes  Physical Activity: Inactive (10/25/2022)   Exercise Vital Sign    Days of Exercise per Week: 0 days    Minutes of Exercise per Session: 0 min  Stress: Stress Concern Present (10/25/2022)   [employer] of Occupational Health - Occupational Stress Questionnaire    Feeling of Stress : Very much  Social Connections: Socially Isolated (10/25/2022)   Social Connection and Isolation Panel    Frequency of Communication with Friends and Family: More than three times a week    Frequency of Social Gatherings with Friends and Family: Never    Attends Religious Services: Never    Database Administrator or Organizations: No    Attends Banker Meetings: Never    Marital Status: Never married  Depression (PHQ2-9): Low Risk (04/09/2024)   Depression (PHQ2-9)    PHQ-2 Score: 1  Recent Concern: Depression (PHQ2-9) - High Risk (02/07/2024)   Depression (PHQ2-9)    PHQ-2 Score: 18  Alcohol Screen: Low Risk (10/25/2022)   Alcohol Screen    Last Alcohol Screening Score (AUDIT): 0  Housing: Medium Risk (10/25/2022)   Housing    Last Housing Risk Score: 1  Utilities: At Risk (10/25/2022)   AHC Utilities    Threatened with loss of utilities: Yes  Health Literacy: Inadequate Health Literacy (10/25/2022)   B1300 Health Literacy    Frequency of need for help with medical instructions: Always    Allergies: No Known Allergies  Metabolic Disorder Labs: Lab Results  Component Value Date   HGBA1C  5.4 08/20/2019   No results found for: PROLACTIN Lab Results  Component Value Date   CHOL 189 09/20/2023   TRIG 87 09/20/2023   HDL 40 09/20/2023  CHOLHDL 4.7 (H) 09/20/2023   LDLCALC 133 (H) 09/20/2023   Lab Results  Component Value Date   TSH 1.070 09/20/2023    Therapeutic Level Labs: No results found for: LITHIUM No results found for: VALPROATE No results found for: CBMZ  Current Medications: Current Outpatient Medications  Medication Sig Dispense Refill   valbenazine  (INGREZZA ) 40 MG capsule Take 1 capsule (40 mg total) by mouth daily. 14 capsule 0   albuterol  (VENTOLIN  HFA) 108 (90 Base) MCG/ACT inhaler Inhale 1-2 puffs into the lungs every 6 (six) hours as needed for wheezing or shortness of breath. 8 g 0   amLODipine  (NORVASC ) 5 MG tablet Take 1 tablet (5 mg total) by mouth at bedtime. (Patient not taking: Reported on 01/24/2024) 90 tablet 3   ARIPiprazole  (ABILIFY ) 5 MG tablet Take 1 tablet (5 mg total) by mouth daily. 30 tablet 2   Capsaicin  0.1 % CREA Apply as needed to the abdomen for pain relief (Patient not taking: Reported on 11/21/2023) 60 g 0   clotrimazole -betamethasone  (LOTRISONE ) cream Apply to affected area on back 2 times daily until improved 15 g 0   doxycycline  (VIBRAMYCIN ) 100 MG capsule Take 1 capsule (100 mg total) by mouth 2 (two) times daily. (Patient not taking: Reported on 01/24/2024) 20 capsule 0   doxylamine , Sleep, (UNISOM  SLEEPTABS) 25 MG tablet Take 1 tablet (25 mg total) by mouth at bedtime as needed. 30 tablet 1   estradiol cypionate (DEPO-ESTRADIOL) 5 MG/ML injection Inject into the muscle every 28 (twenty-eight) days.     fluticasone  (FLONASE ) 50 MCG/ACT nasal spray Place 1 spray into both nostrils daily. (Patient not taking: Reported on 02/21/2024) 15.8 mL 0   hydrOXYzine  (ATARAX ) 50 MG tablet Take 1 tablet (50 mg total) by mouth at bedtime as needed. 30 tablet 1   ketoconazole  (NIZORAL ) 2 % cream Apply 1 Application topically daily.  60 g 2   mupirocin  ointment (BACTROBAN ) 2 % Apply 1 Application topically 2 (two) times daily. To affected area that is infected till better (Patient not taking: Reported on 01/24/2024) 22 g 0   ondansetron  (ZOFRAN -ODT) 4 MG disintegrating tablet Take 1 tablet (4 mg total) by mouth every 8 (eight) hours as needed for nausea or vomiting. (Patient not taking: Reported on 01/24/2024) 20 tablet 0   oxyCODONE  (ROXICODONE ) 5 MG immediate release tablet Take 1 tablet (5 mg total) by mouth every 6 (six) hours as needed for severe pain. (Patient not taking: Reported on 01/24/2024) 12 tablet 0   pantoprazole  (PROTONIX ) 20 MG tablet Take 1 tablet (20 mg total) by mouth daily. 30 tablet 0   promethazine  (PHENERGAN ) 25 MG suppository Place 1 suppository (25 mg total) rectally every 6 (six) hours as needed for nausea or vomiting. (Patient not taking: Reported on 11/21/2023) 120 each 0   promethazine -dextromethorphan (PROMETHAZINE -DM) 6.25-15 MG/5ML syrup Take 5 mLs by mouth 4 (four) times daily as needed for cough. (Patient not taking: Reported on 02/21/2024) 118 mL 0   sertraline  (ZOLOFT ) 100 MG tablet Take 1 tablet (100 mg total) by mouth daily. 30 tablet 2   No current facility-administered medications for this visit.     Musculoskeletal: Strength & Muscle Tone: within normal limits Gait & Station: normal Patient leans: N/A  Psychiatric Specialty Exam: Review of Systems  Psychiatric/Behavioral:  Positive for sleep disturbance. Negative for decreased concentration, dysphoric mood, hallucinations, self-injury and suicidal ideas. The patient is nervous/anxious. The patient is not hyperactive.     There were no vitals taken for this  visit.There is no height or weight on file to calculate BMI.  General Appearance: Casual  Eye Contact:  Good  Speech:  Clear and Coherent and Normal Rate  Volume:  Normal  Mood:  Euthymic  Affect:  Appropriate  Thought Process:  Coherent, Goal Directed, and Descriptions of  Associations: Intact  Orientation:  Full (Time, Place, and Person)  Thought Content: Paranoid Ideation   Suicidal Thoughts:  No  Homicidal Thoughts:  No  Memory:  Immediate;   Good Recent;   Fair Remote;   Fair  Judgement:  Good  Insight:  Present  Psychomotor Activity:  Normal  Concentration:  Concentration: Good and Attention Span: Good  Recall:  Fair  Fund of Knowledge: Good  Language: Good  Akathisia:  No  Handed:  Right  AIMS (if indicated): not done  Assets:  Communication Skills Desire for Improvement Housing Social Support  ADL's:  Intact  Cognition: WNL  Sleep:  Poor   Screenings: AIMS    Flowsheet Row Video Visit from 04/09/2024 in [facility] Video Visit from 02/07/2024 in [facility] Video Visit from 12/19/2023 in [facility] Clinical Support from 08/09/2023 in [facility]  AIMS Total Score 2 2 2 1    GAD-7    Flowsheet Row Video Visit from 04/09/2024 in [facility] Video Visit from 02/07/2024 in [facility] Video Visit from 12/19/2023 in [facility] Clinical Support from 08/09/2023 in [facility] Counselor from 08/08/2023 in [facility]  Total GAD-7 Score 11 15 12 10 13    PHQ2-9    Flowsheet Row Video Visit from 04/09/2024 in [facility] Video Visit from 02/07/2024 in [facility] Video Visit from 12/19/2023 in [facility] Office Visit from 09/20/2023 in [facility] [place] Office Visit from 09/06/2023 in [facility]  PHQ-2 Total Score 1 4 2 2 2   PHQ-9 Total Score -- 18 10 12 8    Flowsheet Row Video Visit from 04/09/2024 in  [facility] Video Visit from 02/07/2024 in [facility] Video Visit from 12/19/2023 in [facility]  C-SSRS RISK CATEGORY Moderate Risk Moderate Risk No Risk     Assessment and Plan:   Anna Moses is a 48 year old female with a past psychiatric history significant for for schizoaffective disorder (depressive type), generalized anxiety disorder, and PTSD presents to [facility] via virtual video visit for follow-up and medication management.  An aims assessment was performed with the patient scoring a 2.  Patient reports that she has been experiencing minimal involuntary movements in her upper and lower extremities.  Patient reports that these involuntary movements have been going on since the last encounter.  Patient reports that she was not able to receive her sleep aid medication from the pharmacy due to the discrepancy with her Medicaid.  Patient reports that she has to enroll for Medicaid.  With the sleep aid medications she has at home, patient reports that she has not found any relief from her sleep.  Patient reports that she has difficulty staying asleep and receives roughly 3 hours of sleep per night.  In regards to her mood, patient  reports that she has been well.  Patient denies overt depressive symptoms but states that she still continues to experience anxiety attributed to going out with her kids or going out to places by herself.  A PHQ 2 screen was performed with the patient scoring a 1.  A GAD-7 screen was also performed with the patient scoring an 11.  For the management of her involuntary movements, provider to provide patient with a 7-day supply of Ingrezza .  Provider instructed patient to contact facility if the medication proves ineffective in managing her involuntary movements.  Patient endorses stability due to use of her current medication regimen and  would like to continue taking her medications as prescribed.  Patient to be represcribed doxylamine  25 mg at bedtime for the management of her sleep.  Patient's medications to be e-prescribed to pharmacy of choice.  Patient labs are up-to-date.  Patient's last EKG was performed on 09/30/2023.  Patient's QTc was 418 ms.  A [place] Suicide Severity Rating Scale was performed with the patient being considered moderate risk.  Patient denies suicidal ideations and is able to contract for safety at this time.  Safety planning was discussed with the patient prior to the conclusion of the encounter.  - Patient was instructed to contact 911 in the event of a mental health crisis. - Patient was instructed to contact [facility] [employer] in the event of a mental health crisis. - Patient was instructed to present to [facility] in the event of a mental health crisis.  Collaboration of Care: Collaboration of Care: Medication Management AEB provider managing patient's psychiatric medications, Primary Care Provider AEB patient being seen by a family medicine provider, Psychiatrist AEB patient being followed by a mental health provider at this facility, and Referral or follow-up with counselor/therapist AEB patient being seen by a licensed clinical social worker at this facility  Patient/Guardian was advised Release of Information must be obtained prior to any record release in order to collaborate their care with an outside provider. Patient/Guardian was advised if they have not already done so to contact the registration department to sign all necessary forms in order for us  to release information regarding their care.   Consent: Patient/Guardian gives verbal consent for treatment and assignment of benefits for services provided during this visit. Patient/Guardian expressed understanding and agreed to proceed.   1. Insomnia due to other mental disorder  - doxylamine ,  Sleep, (UNISOM  SLEEPTABS) 25 MG tablet; Take 1 tablet (25 mg total) by mouth at bedtime as needed.  Dispense: 30 tablet; Refill: 1  2. Schizoaffective disorder, depressive type (HCC)  - ARIPiprazole  (ABILIFY ) 5 MG tablet; Take 1 tablet (5 mg total) by mouth daily.  Dispense: 30 tablet; Refill: 2 - sertraline  (ZOLOFT ) 100 MG tablet; Take 1 tablet (100 mg total) by mouth daily.  Dispense: 30 tablet; Refill: 2  3. Generalized anxiety disorder  - sertraline  (ZOLOFT ) 100 MG tablet; Take 1 tablet (100 mg total) by mouth daily.  Dispense: 30 tablet; Refill: 2  4. PTSD (post-traumatic stress disorder)  - sertraline  (ZOLOFT ) 100 MG tablet; Take 1 tablet (100 mg total) by mouth daily.  Dispense: 30 tablet; Refill: 2  5. Tardive dyskinesia (Primary)  - valbenazine  (INGREZZA ) 40 MG capsule; Take 1 capsule (40 mg total) by mouth daily.  Dispense: 14 capsule; Refill: 0  6. Long term current use of antipsychotic medication Pending lab (Hemoglobin A1c)  7. Marijuana use Marijuana cessation was encouraged  Patient to follow  up in 2 months Provider spent a total of 19 minutes with the patient/reviewing patient's chart  Reginia FORBES Bolster, PA 04/09/2024, 11:30 AM

## 2024-06-04 ENCOUNTER — Telehealth (HOSPITAL_COMMUNITY): Admitting: Physician Assistant
